# Patient Record
Sex: Male | Born: 1950 | Race: White | Hispanic: No | Marital: Married | Smoking: Former smoker
Health system: Southern US, Community
[De-identification: ages and names within clinical notes are randomized; demographics above are authoritative.]

## PROBLEM LIST (undated history)

## (undated) ENCOUNTER — Emergency Department (HOSPITAL_BASED_OUTPATIENT_CLINIC_OR_DEPARTMENT_OTHER): Admission: EM | Payer: Medicare Other | Source: Home / Self Care

## (undated) DIAGNOSIS — G4733 Obstructive sleep apnea (adult) (pediatric): Secondary | ICD-10-CM

## (undated) DIAGNOSIS — M1990 Unspecified osteoarthritis, unspecified site: Secondary | ICD-10-CM

## (undated) DIAGNOSIS — E785 Hyperlipidemia, unspecified: Secondary | ICD-10-CM

## (undated) DIAGNOSIS — F99 Mental disorder, not otherwise specified: Secondary | ICD-10-CM

## (undated) DIAGNOSIS — E669 Obesity, unspecified: Secondary | ICD-10-CM

## (undated) DIAGNOSIS — R001 Bradycardia, unspecified: Secondary | ICD-10-CM

## (undated) DIAGNOSIS — H409 Unspecified glaucoma: Secondary | ICD-10-CM

## (undated) DIAGNOSIS — Z87442 Personal history of urinary calculi: Secondary | ICD-10-CM

## (undated) DIAGNOSIS — F419 Anxiety disorder, unspecified: Secondary | ICD-10-CM

## (undated) DIAGNOSIS — K219 Gastro-esophageal reflux disease without esophagitis: Secondary | ICD-10-CM

## (undated) DIAGNOSIS — I10 Essential (primary) hypertension: Secondary | ICD-10-CM

## (undated) DIAGNOSIS — I2510 Atherosclerotic heart disease of native coronary artery without angina pectoris: Secondary | ICD-10-CM

## (undated) DIAGNOSIS — N400 Enlarged prostate without lower urinary tract symptoms: Secondary | ICD-10-CM

## (undated) DIAGNOSIS — F4310 Post-traumatic stress disorder, unspecified: Secondary | ICD-10-CM

## (undated) DIAGNOSIS — R52 Pain, unspecified: Secondary | ICD-10-CM

## (undated) DIAGNOSIS — K279 Peptic ulcer, site unspecified, unspecified as acute or chronic, without hemorrhage or perforation: Secondary | ICD-10-CM

## (undated) DIAGNOSIS — I358 Other nonrheumatic aortic valve disorders: Secondary | ICD-10-CM

## (undated) HISTORY — DX: Unspecified glaucoma: H40.9

## (undated) HISTORY — DX: Anxiety disorder, unspecified: F41.9

## (undated) HISTORY — DX: Other nonrheumatic aortic valve disorders: I35.8

## (undated) HISTORY — PX: CARDIAC CATHETERIZATION: SHX172

## (undated) HISTORY — PX: CORONARY ANGIOPLASTY: SHX604

## (undated) HISTORY — DX: Peptic ulcer, site unspecified, unspecified as acute or chronic, without hemorrhage or perforation: K27.9

## (undated) HISTORY — DX: Bradycardia, unspecified: R00.1

## (undated) HISTORY — DX: Obesity, unspecified: E66.9

## (undated) HISTORY — PX: OTHER SURGICAL HISTORY: SHX169

## (undated) HISTORY — DX: Obstructive sleep apnea (adult) (pediatric): G47.33

## (undated) HISTORY — DX: Post-traumatic stress disorder, unspecified: F43.10

---
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
## 2002-03-06 ENCOUNTER — Ambulatory Visit (HOSPITAL_COMMUNITY): Admission: RE | Admit: 2002-03-06 | Discharge: 2002-03-07 | Payer: Self-pay | Admitting: Cardiology

## 2002-04-04 ENCOUNTER — Encounter: Payer: Self-pay | Admitting: Family Medicine

## 2002-04-04 ENCOUNTER — Encounter: Admission: RE | Admit: 2002-04-04 | Discharge: 2002-04-04 | Payer: Self-pay | Admitting: Family Medicine

## 2002-11-07 ENCOUNTER — Ambulatory Visit (HOSPITAL_BASED_OUTPATIENT_CLINIC_OR_DEPARTMENT_OTHER): Admission: RE | Admit: 2002-11-07 | Discharge: 2002-11-07 | Payer: Self-pay | Admitting: Orthopedic Surgery

## 2003-03-09 ENCOUNTER — Ambulatory Visit (HOSPITAL_BASED_OUTPATIENT_CLINIC_OR_DEPARTMENT_OTHER): Admission: RE | Admit: 2003-03-09 | Discharge: 2003-03-09 | Payer: Self-pay | Admitting: Family Medicine

## 2003-05-07 ENCOUNTER — Encounter: Admission: RE | Admit: 2003-05-07 | Discharge: 2003-05-07 | Payer: Self-pay | Admitting: Family Medicine

## 2005-03-26 ENCOUNTER — Encounter: Admission: RE | Admit: 2005-03-26 | Discharge: 2005-03-26 | Payer: Self-pay | Admitting: Family Medicine

## 2007-02-09 ENCOUNTER — Emergency Department (HOSPITAL_COMMUNITY): Admission: EM | Admit: 2007-02-09 | Discharge: 2007-02-09 | Payer: Self-pay | Admitting: Emergency Medicine

## 2007-07-25 ENCOUNTER — Inpatient Hospital Stay (HOSPITAL_COMMUNITY): Admission: EM | Admit: 2007-07-25 | Discharge: 2007-07-28 | Payer: Self-pay | Admitting: Emergency Medicine

## 2007-10-24 ENCOUNTER — Other Ambulatory Visit: Payer: Self-pay | Admitting: Emergency Medicine

## 2007-10-24 ENCOUNTER — Inpatient Hospital Stay (HOSPITAL_COMMUNITY): Admission: EM | Admit: 2007-10-24 | Discharge: 2007-10-26 | Payer: Self-pay | Admitting: Emergency Medicine

## 2008-03-02 ENCOUNTER — Encounter: Admission: RE | Admit: 2008-03-02 | Discharge: 2008-03-02 | Payer: Self-pay | Admitting: Orthopedic Surgery

## 2008-08-27 ENCOUNTER — Emergency Department (HOSPITAL_COMMUNITY): Admission: EM | Admit: 2008-08-27 | Discharge: 2008-08-27 | Payer: Self-pay | Admitting: Emergency Medicine

## 2008-11-05 ENCOUNTER — Ambulatory Visit: Payer: Self-pay | Admitting: Radiology

## 2008-11-05 ENCOUNTER — Emergency Department (HOSPITAL_BASED_OUTPATIENT_CLINIC_OR_DEPARTMENT_OTHER): Admission: EM | Admit: 2008-11-05 | Discharge: 2008-11-05 | Payer: Self-pay | Admitting: Emergency Medicine

## 2009-09-03 HISTORY — PX: CERVICAL DISCECTOMY: SHX98

## 2009-09-30 ENCOUNTER — Ambulatory Visit: Payer: Self-pay | Admitting: Cardiology

## 2009-09-30 ENCOUNTER — Inpatient Hospital Stay (HOSPITAL_COMMUNITY)
Admission: RE | Admit: 2009-09-30 | Discharge: 2009-10-05 | Payer: Self-pay | Source: Home / Self Care | Admitting: Orthopedic Surgery

## 2010-02-12 ENCOUNTER — Emergency Department (HOSPITAL_BASED_OUTPATIENT_CLINIC_OR_DEPARTMENT_OTHER): Admission: EM | Admit: 2010-02-12 | Discharge: 2010-02-12 | Payer: Self-pay | Admitting: Emergency Medicine

## 2010-03-18 ENCOUNTER — Encounter: Admission: RE | Admit: 2010-03-18 | Discharge: 2010-03-18 | Payer: Self-pay | Admitting: Family Medicine

## 2010-03-20 IMAGING — CR DG FOOT COMPLETE 3+V*L*
3 series · 3 of 3 positions shown · non-contrast
Comparison: None

CLINICAL DATA: Foot and ankle pain

LEFT FOOT - COMPLETE 3+ VIEW

[t foot lat left]
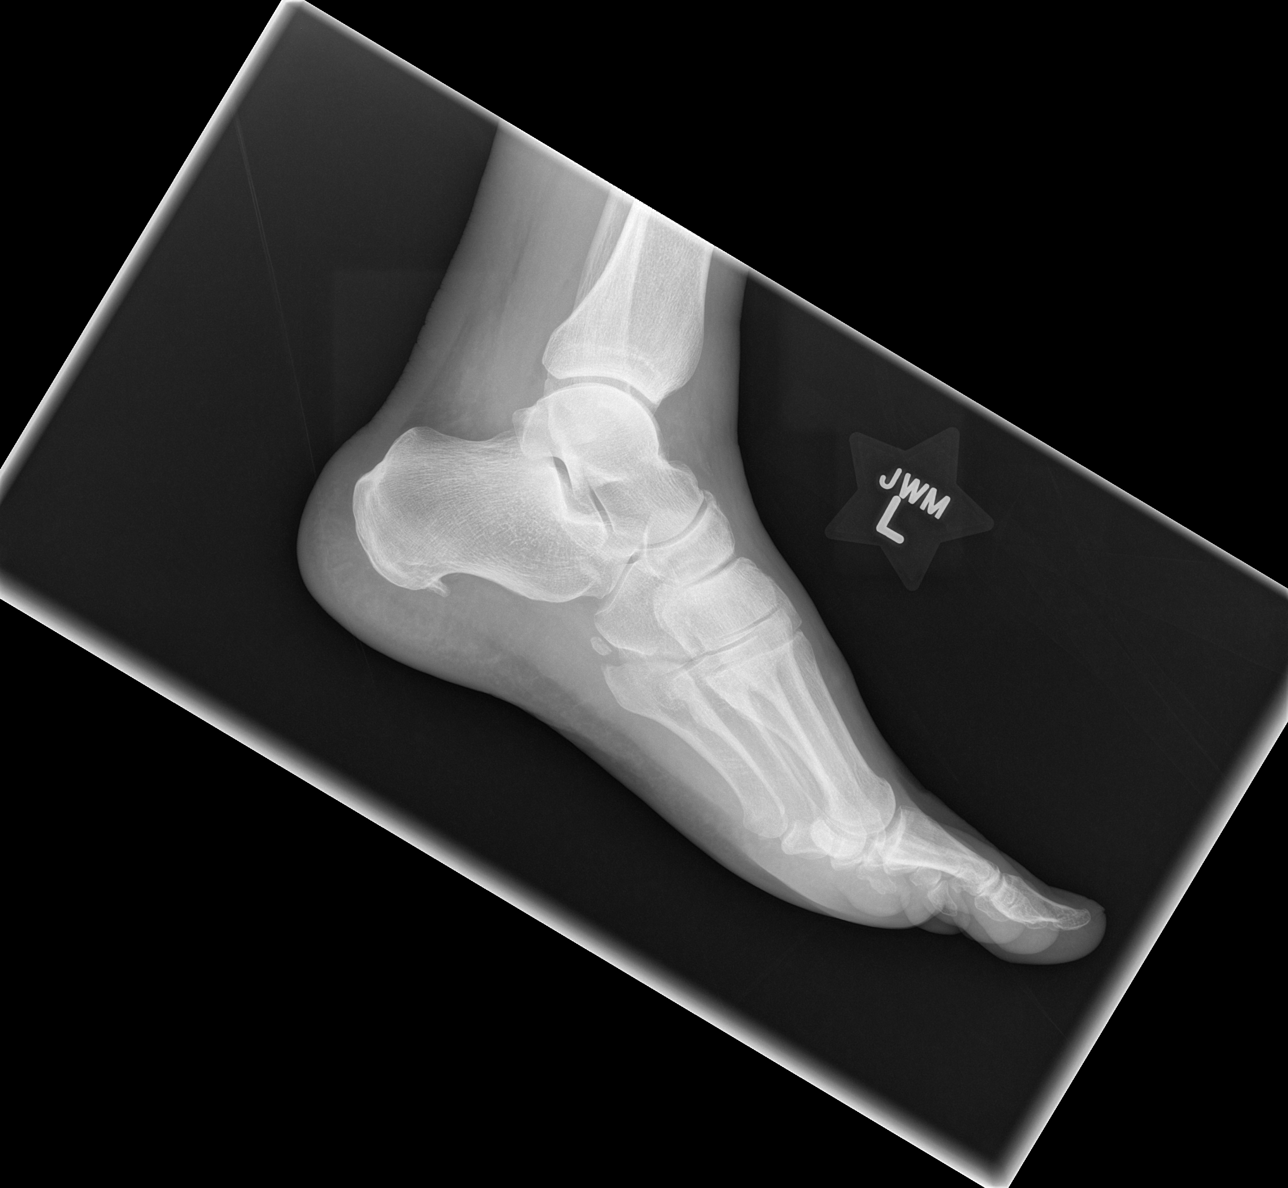

[t foot ap left]
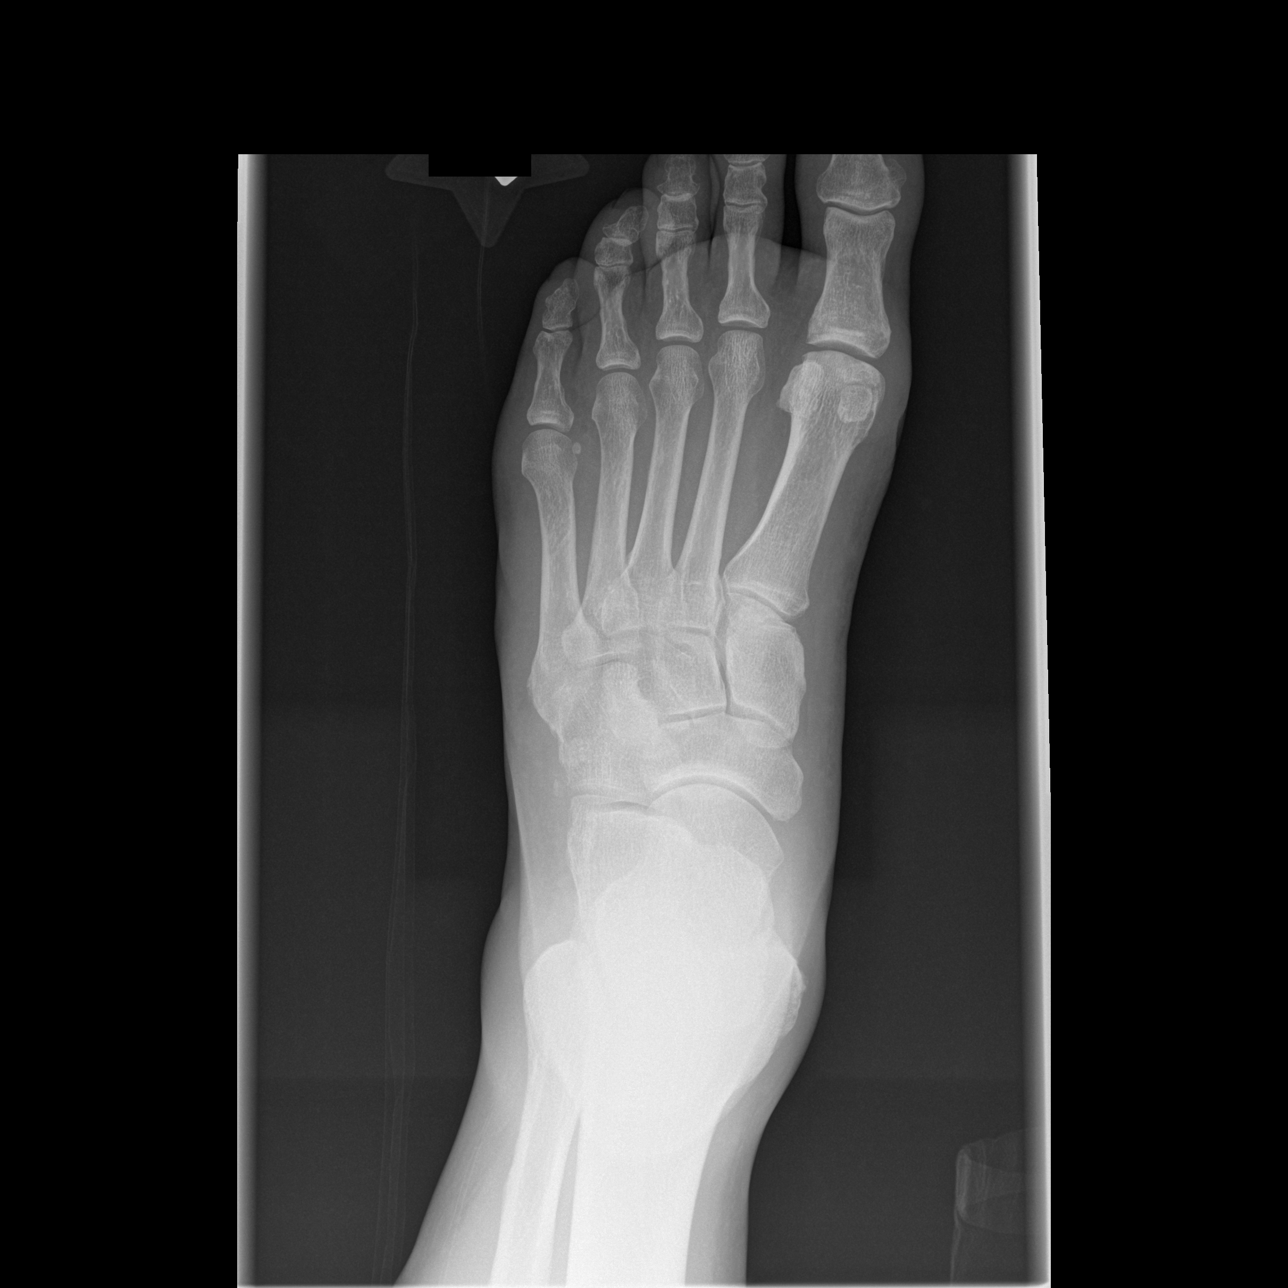

[t foot oblique left]
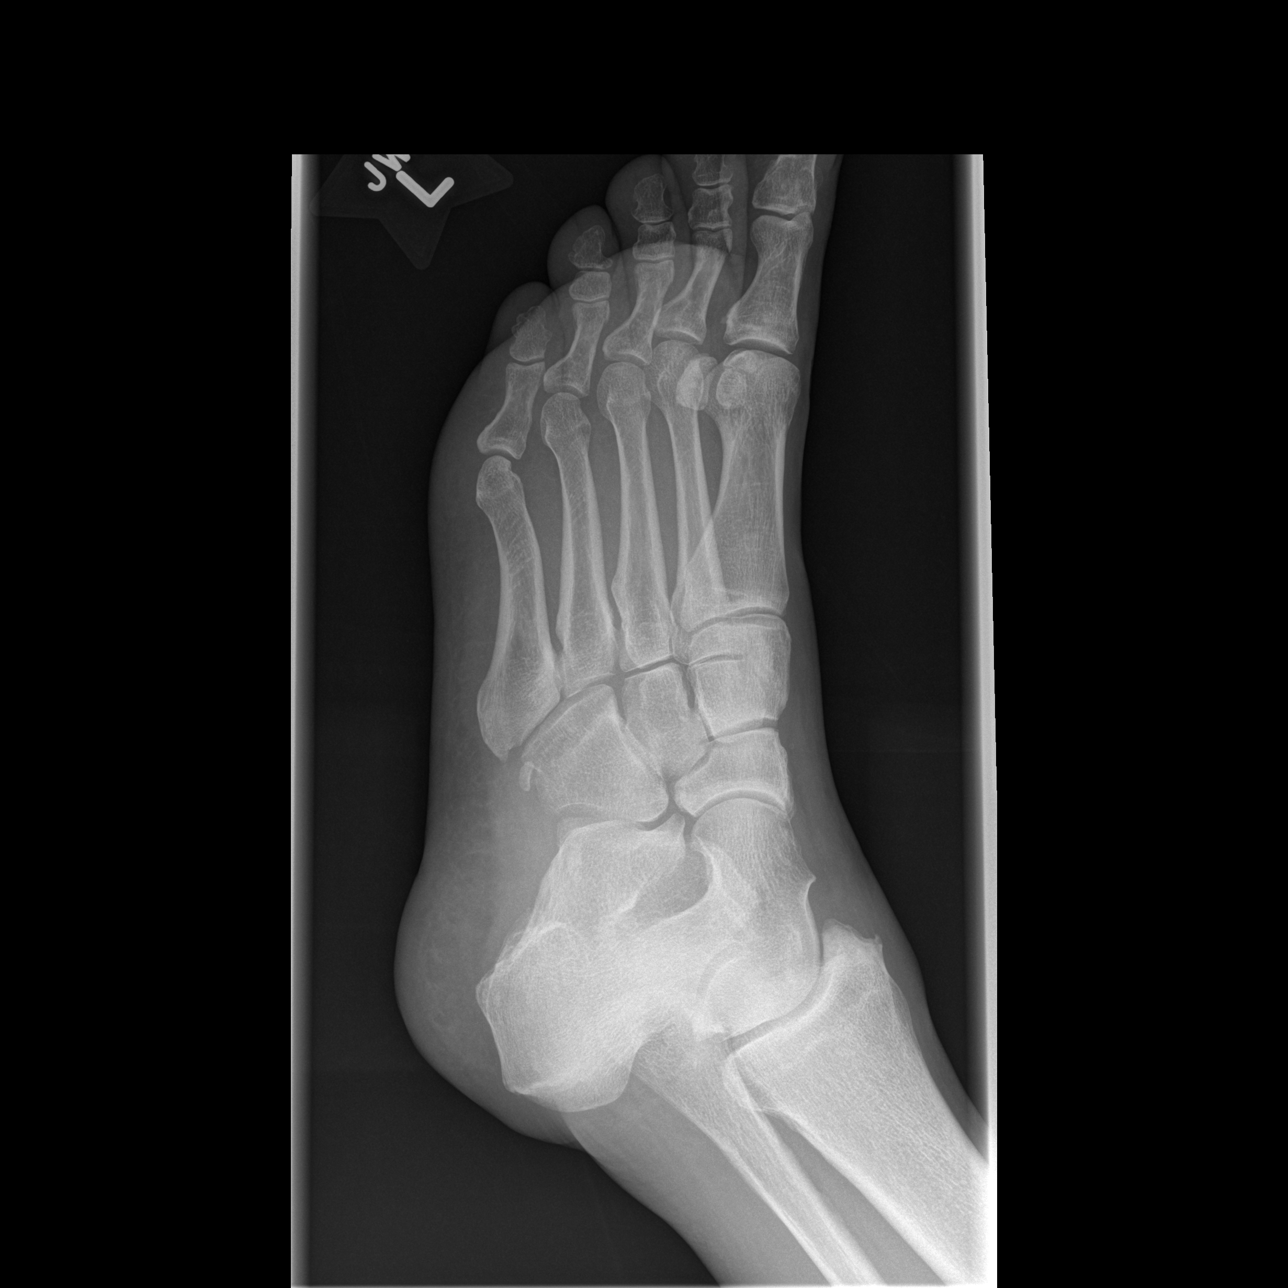

[3 of 3 positions shown; findings below may reference images not displayed]

FINDINGS: Anatomic alignment.  No fracture.  Mild degenerative
changes.
IMPRESSION: Negative for fracture.

## 2010-04-17 ENCOUNTER — Emergency Department (HOSPITAL_BASED_OUTPATIENT_CLINIC_OR_DEPARTMENT_OTHER): Admission: EM | Admit: 2010-04-17 | Discharge: 2010-04-17 | Payer: Self-pay | Admitting: Emergency Medicine

## 2010-04-17 ENCOUNTER — Ambulatory Visit: Payer: Self-pay | Admitting: Diagnostic Radiology

## 2010-06-23 ENCOUNTER — Encounter (HOSPITAL_COMMUNITY)
Admission: RE | Admit: 2010-06-23 | Discharge: 2010-07-05 | Payer: Self-pay | Source: Home / Self Care | Attending: Cardiology | Admitting: Cardiology

## 2010-06-28 LAB — GLUCOSE, CAPILLARY: Glucose-Capillary: 292 mg/dL — ABNORMAL HIGH (ref 70–99)

## 2010-07-06 ENCOUNTER — Encounter (HOSPITAL_COMMUNITY): Payer: BC Managed Care – PPO

## 2010-07-08 ENCOUNTER — Encounter (HOSPITAL_COMMUNITY): Payer: BC Managed Care – PPO

## 2010-07-11 ENCOUNTER — Other Ambulatory Visit: Payer: Self-pay

## 2010-07-11 ENCOUNTER — Encounter (HOSPITAL_COMMUNITY): Payer: BC Managed Care – PPO | Attending: Cardiology

## 2010-07-11 DIAGNOSIS — E119 Type 2 diabetes mellitus without complications: Secondary | ICD-10-CM | POA: Insufficient documentation

## 2010-07-11 DIAGNOSIS — I2510 Atherosclerotic heart disease of native coronary artery without angina pectoris: Secondary | ICD-10-CM | POA: Insufficient documentation

## 2010-07-11 DIAGNOSIS — Z7982 Long term (current) use of aspirin: Secondary | ICD-10-CM | POA: Insufficient documentation

## 2010-07-11 DIAGNOSIS — I252 Old myocardial infarction: Secondary | ICD-10-CM | POA: Insufficient documentation

## 2010-07-11 DIAGNOSIS — G4733 Obstructive sleep apnea (adult) (pediatric): Secondary | ICD-10-CM | POA: Insufficient documentation

## 2010-07-11 DIAGNOSIS — Z7902 Long term (current) use of antithrombotics/antiplatelets: Secondary | ICD-10-CM | POA: Insufficient documentation

## 2010-07-11 DIAGNOSIS — Z5189 Encounter for other specified aftercare: Secondary | ICD-10-CM | POA: Insufficient documentation

## 2010-07-11 DIAGNOSIS — Z9861 Coronary angioplasty status: Secondary | ICD-10-CM | POA: Insufficient documentation

## 2010-07-11 DIAGNOSIS — I10 Essential (primary) hypertension: Secondary | ICD-10-CM | POA: Insufficient documentation

## 2010-07-12 LAB — GLUCOSE, CAPILLARY: Glucose-Capillary: 229 mg/dL — ABNORMAL HIGH (ref 70–99)

## 2010-07-13 ENCOUNTER — Encounter (HOSPITAL_COMMUNITY): Payer: BC Managed Care – PPO

## 2010-07-13 ENCOUNTER — Other Ambulatory Visit: Payer: Self-pay

## 2010-07-13 LAB — GLUCOSE, CAPILLARY: Glucose-Capillary: 206 mg/dL — ABNORMAL HIGH (ref 70–99)

## 2010-07-15 ENCOUNTER — Encounter (HOSPITAL_COMMUNITY): Payer: BC Managed Care – PPO

## 2010-07-18 ENCOUNTER — Encounter (HOSPITAL_COMMUNITY): Payer: BC Managed Care – PPO

## 2010-07-20 ENCOUNTER — Encounter (HOSPITAL_COMMUNITY): Payer: BC Managed Care – PPO

## 2010-07-22 ENCOUNTER — Encounter (HOSPITAL_COMMUNITY): Payer: BC Managed Care – PPO

## 2010-07-25 ENCOUNTER — Encounter (HOSPITAL_COMMUNITY): Payer: BC Managed Care – PPO

## 2010-07-27 ENCOUNTER — Encounter (HOSPITAL_COMMUNITY): Payer: BC Managed Care – PPO

## 2010-07-29 ENCOUNTER — Encounter (HOSPITAL_COMMUNITY): Payer: BC Managed Care – PPO

## 2010-08-01 ENCOUNTER — Encounter (HOSPITAL_COMMUNITY): Payer: BC Managed Care – PPO

## 2010-08-03 ENCOUNTER — Encounter (HOSPITAL_COMMUNITY): Payer: BC Managed Care – PPO

## 2010-08-05 ENCOUNTER — Encounter (HOSPITAL_COMMUNITY)

## 2010-08-08 ENCOUNTER — Encounter (HOSPITAL_COMMUNITY)

## 2010-08-10 ENCOUNTER — Encounter (HOSPITAL_COMMUNITY)

## 2010-08-12 ENCOUNTER — Encounter (HOSPITAL_COMMUNITY)

## 2010-08-15 ENCOUNTER — Encounter (HOSPITAL_COMMUNITY)

## 2010-08-16 LAB — BASIC METABOLIC PANEL
CO2: 25 mEq/L (ref 19–32)
Chloride: 101 mEq/L (ref 96–112)
Creatinine, Ser: 0.9 mg/dL (ref 0.4–1.5)
Glucose, Bld: 184 mg/dL — ABNORMAL HIGH (ref 70–99)
Potassium: 4.8 mEq/L (ref 3.5–5.1)
Sodium: 138 mEq/L (ref 135–145)

## 2010-08-16 LAB — CBC
Hemoglobin: 15.1 g/dL (ref 13.0–17.0)
MCH: 31.1 pg (ref 26.0–34.0)
MCV: 91.2 fL (ref 78.0–100.0)
Platelets: 185 10*3/uL (ref 150–400)
RBC: 4.84 MIL/uL (ref 4.22–5.81)
RDW: 12.1 % (ref 11.5–15.5)

## 2010-08-16 LAB — DIFFERENTIAL
Basophils Absolute: 0.1 10*3/uL (ref 0.0–0.1)
Basophils Relative: 1 % (ref 0–1)
Lymphocytes Relative: 21 % (ref 12–46)
Neutro Abs: 5.8 10*3/uL (ref 1.7–7.7)

## 2010-08-17 ENCOUNTER — Encounter (HOSPITAL_COMMUNITY)

## 2010-08-19 ENCOUNTER — Encounter (HOSPITAL_COMMUNITY)

## 2010-08-22 ENCOUNTER — Encounter (HOSPITAL_COMMUNITY)

## 2010-08-22 ENCOUNTER — Emergency Department (INDEPENDENT_AMBULATORY_CARE_PROVIDER_SITE_OTHER): Payer: BC Managed Care – PPO

## 2010-08-22 ENCOUNTER — Emergency Department (HOSPITAL_BASED_OUTPATIENT_CLINIC_OR_DEPARTMENT_OTHER)
Admission: EM | Admit: 2010-08-22 | Discharge: 2010-08-22 | Disposition: A | Discharge status: Home / Self Care | Payer: BC Managed Care – PPO | Attending: Emergency Medicine | Admitting: Emergency Medicine

## 2010-08-22 DIAGNOSIS — E119 Type 2 diabetes mellitus without complications: Secondary | ICD-10-CM | POA: Insufficient documentation

## 2010-08-22 DIAGNOSIS — J029 Acute pharyngitis, unspecified: Secondary | ICD-10-CM

## 2010-08-22 DIAGNOSIS — I252 Old myocardial infarction: Secondary | ICD-10-CM | POA: Insufficient documentation

## 2010-08-22 DIAGNOSIS — Z79899 Other long term (current) drug therapy: Secondary | ICD-10-CM | POA: Insufficient documentation

## 2010-08-22 DIAGNOSIS — I2510 Atherosclerotic heart disease of native coronary artery without angina pectoris: Secondary | ICD-10-CM | POA: Insufficient documentation

## 2010-08-22 DIAGNOSIS — I10 Essential (primary) hypertension: Secondary | ICD-10-CM | POA: Insufficient documentation

## 2010-08-22 DIAGNOSIS — E7800 Pure hypercholesterolemia, unspecified: Secondary | ICD-10-CM | POA: Insufficient documentation

## 2010-08-22 LAB — RAPID STREP SCREEN (MED CTR MEBANE ONLY): Streptococcus, Group A Screen (Direct): NEGATIVE

## 2010-08-23 LAB — GLUCOSE, CAPILLARY
Glucose-Capillary: 196 mg/dL — ABNORMAL HIGH (ref 70–99)
Glucose-Capillary: 302 mg/dL — ABNORMAL HIGH (ref 70–99)
Glucose-Capillary: 308 mg/dL — ABNORMAL HIGH (ref 70–99)
Glucose-Capillary: 141 mg/dL — ABNORMAL HIGH (ref 70–99)
Glucose-Capillary: 174 mg/dL — ABNORMAL HIGH (ref 70–99)
Glucose-Capillary: 246 mg/dL — ABNORMAL HIGH (ref 70–99)
Glucose-Capillary: 280 mg/dL — ABNORMAL HIGH (ref 70–99)
Glucose-Capillary: 302 mg/dL — ABNORMAL HIGH (ref 70–99)
Glucose-Capillary: 362 mg/dL — ABNORMAL HIGH (ref 70–99)

## 2010-08-23 LAB — CBC
HCT: 41 % (ref 39.0–52.0)
Hemoglobin: 13.5 g/dL (ref 13.0–17.0)
Hemoglobin: 14.6 g/dL (ref 13.0–17.0)
MCHC: 34.3 g/dL (ref 30.0–36.0)
MCHC: 34.5 g/dL (ref 30.0–36.0)
MCHC: 35.2 g/dL (ref 30.0–36.0)
MCV: 93 fL (ref 78.0–100.0)
Platelets: 208 10*3/uL (ref 150–400)
Platelets: 221 10*3/uL (ref 150–400)
RBC: 4.57 MIL/uL (ref 4.22–5.81)
RDW: 12.4 % (ref 11.5–15.5)
RDW: 12.4 % (ref 11.5–15.5)
WBC: 7.6 10*3/uL (ref 4.0–10.5)

## 2010-08-23 LAB — PROTIME-INR
INR: 0.97 (ref 0.00–1.49)
INR: 1.07 (ref 0.00–1.49)
Prothrombin Time: 13.8 seconds (ref 11.6–15.2)

## 2010-08-23 LAB — BASIC METABOLIC PANEL
BUN: 8 mg/dL (ref 6–23)
CO2: 25 mEq/L (ref 19–32)
CO2: 25 mEq/L (ref 19–32)
Calcium: 8.3 mg/dL — ABNORMAL LOW (ref 8.4–10.5)
Calcium: 8.7 mg/dL (ref 8.4–10.5)
Chloride: 104 mEq/L (ref 96–112)
Creatinine, Ser: 0.82 mg/dL (ref 0.4–1.5)
GFR calc Af Amer: >60 mL/min (ref >60)
GFR calc non Af Amer: >60 mL/min (ref >60)
Glucose, Bld: 244 mg/dL — ABNORMAL HIGH (ref 70–99)
Sodium: 131 mEq/L — ABNORMAL LOW (ref 135–145)
Sodium: 134 mEq/L — ABNORMAL LOW (ref 135–145)

## 2010-08-23 LAB — CARDIAC PANEL(CRET KIN+CKTOT+MB+TROPI)
CK, MB: 147.9 ng/mL (ref 0.3–4.0)
Relative Index: 14.8 — ABNORMAL HIGH (ref 0.0–2.5)
Relative Index: 3.9 — ABNORMAL HIGH (ref 0.0–2.5)
Total CK: 179 U/L (ref 7–232)
Total CK: 807 U/L — ABNORMAL HIGH (ref 7–232)
Troponin I: 0.17 ng/mL — ABNORMAL HIGH (ref 0.00–0.06)
Troponin I: 12.11 ng/mL (ref 0.00–0.06)
Troponin I: 3.45 ng/mL (ref 0.00–0.06)

## 2010-08-23 LAB — APTT: aPTT: 25 seconds (ref 24–37)

## 2010-08-23 LAB — HEMOGLOBIN A1C
Hgb A1c MFr Bld: 7.3 % — ABNORMAL HIGH (ref <5.7)
Mean Plasma Glucose: 163 mg/dL — ABNORMAL HIGH (ref <117)

## 2010-08-23 LAB — COMPREHENSIVE METABOLIC PANEL
Albumin: 3.9 g/dL (ref 3.5–5.2)
Alkaline Phosphatase: 52 U/L (ref 39–117)
BUN: 7 mg/dL (ref 6–23)
Calcium: 8.5 mg/dL (ref 8.4–10.5)
Creatinine, Ser: 0.82 mg/dL (ref 0.4–1.5)
Potassium: 4.1 mEq/L (ref 3.5–5.1)
Total Protein: 7 g/dL (ref 6.0–8.3)

## 2010-08-23 LAB — TSH: TSH: 0.715 u[IU]/mL (ref 0.350–4.500)

## 2010-08-23 LAB — MRSA PCR SCREENING: MRSA by PCR: NEGATIVE

## 2010-08-24 ENCOUNTER — Encounter (HOSPITAL_COMMUNITY)

## 2010-08-26 ENCOUNTER — Encounter (HOSPITAL_COMMUNITY)

## 2010-08-29 ENCOUNTER — Encounter (HOSPITAL_COMMUNITY)

## 2010-08-31 ENCOUNTER — Encounter (HOSPITAL_COMMUNITY)

## 2010-09-02 ENCOUNTER — Encounter (HOSPITAL_COMMUNITY)

## 2010-09-05 ENCOUNTER — Encounter (HOSPITAL_COMMUNITY)

## 2010-09-07 ENCOUNTER — Encounter (HOSPITAL_COMMUNITY)

## 2010-09-09 ENCOUNTER — Encounter (HOSPITAL_COMMUNITY)

## 2010-09-12 ENCOUNTER — Encounter (HOSPITAL_COMMUNITY)

## 2010-09-14 ENCOUNTER — Encounter (HOSPITAL_COMMUNITY)

## 2010-09-15 LAB — POCT I-STAT, CHEM 8
Calcium, Ion: 1 mmol/L — ABNORMAL LOW (ref 1.12–1.32)
Chloride: 104 mEq/L (ref 96–112)
Creatinine, Ser: 0.9 mg/dL (ref 0.4–1.5)
Glucose, Bld: 163 mg/dL — ABNORMAL HIGH (ref 70–99)
Potassium: 3.8 mEq/L (ref 3.5–5.1)

## 2010-09-15 LAB — URINALYSIS, ROUTINE W REFLEX MICROSCOPIC
Bilirubin Urine: NEGATIVE
Hgb urine dipstick: NEGATIVE
Nitrite: NEGATIVE
Specific Gravity, Urine: 1.022 (ref 1.005–1.030)
Urobilinogen, UA: 1 mg/dL (ref 0.0–1.0)
pH: 5.5 (ref 5.0–8.0)

## 2010-09-15 LAB — CBC
Hemoglobin: 15.9 g/dL (ref 13.0–17.0)
MCV: 91.6 fL (ref 78.0–100.0)
RBC: 4.98 MIL/uL (ref 4.22–5.81)
WBC: 7.7 10*3/uL (ref 4.0–10.5)

## 2010-09-15 LAB — DIFFERENTIAL
Lymphs Abs: 2.6 10*3/uL (ref 0.7–4.0)
Monocytes Absolute: 0.6 10*3/uL (ref 0.1–1.0)
Monocytes Relative: 8 % (ref 3–12)
Neutro Abs: 3.9 10*3/uL (ref 1.7–7.7)
Neutrophils Relative %: 51 % (ref 43–77)

## 2010-09-16 ENCOUNTER — Encounter (HOSPITAL_COMMUNITY)

## 2010-09-19 ENCOUNTER — Encounter (HOSPITAL_COMMUNITY)

## 2010-09-21 ENCOUNTER — Encounter (HOSPITAL_COMMUNITY)

## 2010-09-23 ENCOUNTER — Encounter (HOSPITAL_COMMUNITY)

## 2010-09-26 ENCOUNTER — Encounter (HOSPITAL_COMMUNITY)

## 2010-10-18 NOTE — Cardiovascular Report (Signed)
Seth Washington, Seth Washington               ACCOUNT NO.:  [id number]   MEDICAL RECORD NO.:  [id number]          PATIENT TYPE:  INP   LOCATION:  2920                         FACILITY:  [facility]   PHYSICIAN:  Armanda Magic, M.D.     DATE OF BIRTH:  1950-11-29   DATE OF PROCEDURE:  DATE OF DISCHARGE:                            CARDIAC CATHETERIZATION   PROCEDURES:  Left heart catheterization, coronary angiography, left  ventriculography, aortic root angiography.   OPERATOR:  Armanda Magic, MD.   INDICATIONS:  Acute non-ST elevation MI with an occluded posterolateral  branch of the RCA.   COMPLICATIONS:  None.   IV MEDICATIONS:  1. Versed 3 mg IV.  2. Lopressor 7.5 mg IV.  3. Fentanyl 50 mcg IV.  4. IV nitroglycerin at 45 mcg per minute.   IV ACCESS:  Via right femoral artery 6-French sheath.   HISTORY:  This is a 59 year old male with a history of coronary disease  status post PTCA stenting of the LAD several years ago, who presented  with an episode of chest pain that awakened him at night.  By the time  he got to the emergency room, he was pain free.  Initial point of care  enzymes were normal.  Around 1 p.m. today, he developed acute substernal  chest pain recurrent similar to what he had last night, radiating down  his arms up into his neck.  It was 10/10.  He was markedly hypertensive  with a pressure 168 over approximately 170 mmHg.  The patient was taken  emergently to cardiac catheterization laboratory after informed consent  was obtained.  The patient was connected to continuous heart rate, pulse  oximetry monitoring and intermittent blood pressure monitoring.  The  right groin was prepped and draped in a sterile fashion.  Xylocaine 1%  was used for local anesthesia.  Using modified Seldinger technique, a 6-  French sheath was placed in the right femoral artery.  Under  fluoroscopic guidance, a 6-French JL-4 catheter was placed in the left  coronary artery.  It could not engage the  coronary ostium adequately and  was exchanged out over a guidewire for 6-French JL-5 catheter which was  placed under fluoroscopic guidance in the left coronary artery.  Multiple cine films were taken at 30 degree RAO and 40 degree LAO views.  His catheter was then exchanged out over a guidewire for a 6-French JR-4  catheter which was placed under fluoroscopic guidance in the right  coronary artery.  Multiple cine films were taken at 30 degree RAO and 40  degree LAO views.  His catheter was then exchanged out over a guidewire  for a 6-French angled pigtail catheter which was placed under  fluoroscopic guidance in the left ventricular cavity.  Left  ventriculography was performed in the 30 degree RAO view using a total  of 30 cc of contrast at 15 cc per second.  The catheter was then pulled  back across the aortic valve with no significant gradient noted.  Aortic  root shot was then performed using a total of 25 cc of contrast at 12 cc  per second.  Catheter was then removed over a guidewire.  The patient  subsequently went on to PCI of the posterolateral branch of the RCA.   RESULTS:  The left main coronary artery is widely patent and bifurcates  to the left anterior descending artery and left circumflex artery.  The  left anterior descending artery has a 50% proximal narrowing just before  the stent.  The stent is widely patent.  The LAD gives off several  diagonal branches.  The first diagonal branch has a fairly tight  stenosis of at least 90% and is a moderate-sized vessel.  The ongoing  LAD in the midportion is widely patent, but then distally has a 60%  narrowing and then 2 sequential 80% narrowings at the very apical  portion of the LAD.   The left circumflex in its proximal portion is patent and gives off to 2  very small obtuse marginal branches.  There is then a 60-70% narrowing  in the proximal to mid left circumflex.  It gives rise to a second  obtuse marginal branch which  is widely patent.  The ongoing left  circumflex traverses the AV groove and gives rise to a third obtuse  marginal branch which is widely patent and then terminates in a fourth  obtuse marginal branch with a 60-70% mid stenosis.  The right coronary  artery has luminal irregularities throughout its proximal midportion.  There appears to be sluggish flow in the distal portion of the RCA.  At  the bifurcation of the RCA distally, it bifurcates into posterior  descending artery which is patent and a posterolateral artery which is  occluded proximally.   Left ventriculography shows normal LV function, EF 55% with inferior  basal hypokinesis. LV pressure 158/90 mmHg, aortic pressure 174/96 mmHg.   ASSESSMENT:  1. Severe two-vessel coronary disease with occluded posterolateral      branch and high-grade distal left anterior descending.  2. Normal left ventricular function.  3. Malignant hypertension, status post treatment with IV Lopressor and      IV nitroglycerin drip.   PLAN:  1. PCI of the posterolateral branch of the RCA.  2. Aspirin, Plavix and IV nitroglycerin drip for blood pressure      control.  3. Altace 5 mg a day.  4. Lopressor 25 mg b.i.d.  5. Check a fasting lipid panel in the morning.      Armanda Magic, M.D.  Electronically Signed     TT/MEDQ  D:  07/25/2007  T:  07/26/2007  Job:  [id number]   cc:   Bryan Lemma. Manus Gunning, M.D.

## 2010-10-18 NOTE — H&P (Signed)
[name], [name]               ACCOUNT NO.:  [id number]   MEDICAL RECORD NO.:  [id number]          PATIENT TYPE:  INP   LOCATION:  2807                         FACILITY:  [facility]   PHYSICIAN:  Armanda Magic, M.D.     DATE OF BIRTH:  01-Jul-1950   DATE OF ADMISSION:  10/24/2007  DATE OF DISCHARGE:                              HISTORY & PHYSICAL   CHIEF COMPLAINT:  Chest burning, neck tightness.   HISTORY OF PRESENT ILLNESS:  Seth Washington is a pleasant 60 year old  gentleman with known CAD, status post acute inferior non-ST elevated MI  in February this year.  He had two-vessel disease on cath at that time  and underwent PCI on the distal total occluded RCA.  His LAD had distal  occlusion at 80% in the very apical portion.  He has done well since the  procedure.  Over the last 2 days, however, he began experiencing the  same chest burning and shortness of breath that he had prior to his MI.  It would radiate into the neck and feel like a tightness.  Symptoms are  associated with exertion.  On one particular episode after a walk, he  had some sweats with it.  He has not tried any nitroglycerin for this  discomfort, but does state that the discomfort was not relieved by  nitroglycerin before when he had his MI.  Due to his history, he is  being admitted to rule out MI and have possible catheterization this  afternoon.   REVIEW OF SYSTEMS:  GENERAL:  Just has not felt right.  No fever,  chills, recent weight loss or gain.  HEENT: No problems with vision,  diplopia, hearing loss, epistaxis, nasal congestion, sore throat.  CHEST:  As above.  The pain is not pleuritic nor has he experienced any  palpitations or tachyarrhythmias.  LUNGS:  As above.  No cough,  wheezing, PND or orthopnea.  ABDOMEN: No nausea, vomiting, abdominal  pain, problems with stools.  GU: No dysuria, urinary frequency,  hematuria.  EXTREMITIES:  Denies any edema.  NEUROLOGICAL: No  lightheadedness, dizziness,  presyncope, syncope, memory loss, seizures.   PAST MEDICAL HISTORY:  1. CAD, two-vessel status post PTCA stenting of the LAD, February      2008.  Non-ST elevated MI status post PTCA stenting of the PDA,      currently on aspirin and Plavix.  2. Distal RCA disease.  3. Normal LV function.  4. Hyperlipidemia.  5. Previous tobacco abuse.  6. Hypertension.  7. Anxiety.   PAST SURGICAL HISTORY:  Knee surgery in 1974.   MEDICATIONS:  1. Amlodipine 10 mg daily.  2. Omeprazole 20 mg daily.  3. Aspirin 325 mg day.  4. Plavix 75 mg daily.  5. Lopressor 25 mg one p.o. b.i.d.  6. Enalapril 5 mg daily.  7. Tamsulosin 0.4 mg daily.  8. Wellbutrin ER 150 mg twice a day.  9. Crestor 20 mg a day  10.Ambien 10 mg at bedtime p.r.n.  11.Claritin p.r.n.   DRUG ALLERGIES:  NONE.   SOCIAL HISTORY:  He is married with two  children in good health.  Previous smoker, one to one and a half packs for many years, quit in  February of this year.  No significant alcohol intake.  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   FAMILY HISTORY:  Father is mid 67s, hypertension, heart disease, also  had diabetes.  Mother in her early 7s with thyroid problems.  Brother  had CABG when he was 74.   OBJECTIVE:  VITAL SIGNS:  Weight is 223, pulse 68, blood pressure  110/74, respirations 18.  GENERAL: No acute distress.  SKIN:  Warm and dry.  HEENT: Ear, nose, throat unremarkable.  NECK:  No JVD.  No carotid bruits auscultated.  No adenopathy.  Thyroid  gland is not enlarged.  HEART:  Normal S1-S2.  No murmur, S3-S4 rub, click or gallop.  LUNGS:  Good excursion, clear throughout without rales, wheezing or  rhonchi.  ABDOMEN:  Nontender.  Bowel sounds present in all four quadrants.  No  abdominal bruits.  No tenderness.  EXTREMITIES:  Distal pulses +2 in the upper lower extremities.  No  clubbing, cyanosis or edema.   STUDIES:  EKG reveals normal sinus rhythm.  He has Q-waves and T-wave  inversion in  leads III and aVF, unchanged from prior.   IMPRESSION:  1. Chest discomfort, worrisome for angina.  2. Known CAD, status post intervention above.  3. Hypertension.  4. Anxiety.  5. Hyperlipidemia.   PLAN:  Will admit to telemetry.  Start nitroglycerin IV and heparin drip  per pharmacy protocol.  N.p.o. for possible cath this afternoon.      Tamera C. Baruch Merl, M.D.  Electronically Signed    TCL/MEDQ  D:  10/24/2007  T:  10/24/2007  Job:  [id number]

## 2010-10-18 NOTE — H&P (Signed)
Seth Washington, Seth Washington               ACCOUNT NO.:  [id number]   MEDICAL RECORD NO.:  [id number]          PATIENT TYPE:  INP   LOCATION:  2807                         FACILITY:  [facility]   PHYSICIAN:  Cassell Clement, M.D. DATE OF BIRTH:  [birth year]   DATE OF ADMISSION:  07/25/2007  DATE OF DISCHARGE:                              HISTORY & PHYSICAL   CHIEF COMPLAINT:  Chest pain and neck pain.   HISTORY:  This is a 60 year old gentleman who came to the emergency room  during the night with chest pain and neck pain.  He does have a past  history of coronary artery disease and had a stent by Dr. Mayford Knife and Dr.  Amil Amen about 4 years ago.  We do not have those records at the present  time.  He has had no recent stress test, etc.  He awoke last night out  of sleep with chest and neck discomfort.  He was short of breath.  There  was no nausea, vomiting or diaphoresis.  There was no radiation to the  arms.  The patient has had a recent head cold and had taken 2 Advil PM  at bedtime.  He had also eaten a sandwich at bedtime.   The patient has not been experiencing any recent exertional chest pain  to suggest angina pectoris.  He does not get any regular exercise.   His present medications are aspirin 325 mg daily, loratadine 10 mg as  needed, metoprolol 50 mg daily, omeprazole 20 mg daily and simvastatin  80 mg daily, and he takes them all in the evening but forgot to take  them last night.   Past medical history includes coronary artery disease, high cholesterol  and hypertension.   The family history reveals that his father is deceased but had had a  stent.  The patient's brother has had bypass surgery.  The patient's  mother is living and well.   SOCIAL HISTORY:  Reveals that he is married.  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  He has 2 children who live in [place].  The patient smokes cigars.  He  does not smoke cigarettes.  He drinks  very little alcohol.   He has no known drug allergies.   Review of systems reveals a history of gastroesophageal reflux.  Genitourinary history reveals that the [facility] had previously had  him on Flomax for BPH.  He has had a lot of orthopedic problems and  previous orthopedic surgery and presently needs surgery on his right  shoulder.  Dr. Luiz Blare is his orthopedist.  The remainder of review of  systems is negative in detail.   PHYSICAL EXAMINATION:  VITAL SIGNS:  His blood pressure 146/97, pulse  67, respirations normal.  HEENT: Negative.  CHEST:  Clear.  HEART:  Reveals no murmur, gallop, rub or click.  ABDOMEN:  Soft and nontender.  EXTREMITIES:  Show no phlebitis or edema.  Pedal pulses are good.   His electrocardiogram shows normal sinus rhythm and  is within normal  limits.  Initial cardiac enzymes point-of-care shows CK-MB less than 1,  troponin less than 0.05.  White count is 13,000, hemoglobin is 16.6.  Sodium 137, potassium 3.4, chloride 102, BUN 10, blood sugar 157.  He  does not have a history of diabetes.  Chest x-ray shows no acute  cardiopulmonary disease and mildly prominent transverse aorta.   IMPRESSION:  1. Chest pain, possibly cardiac in origin.  2. History of known coronary atherosclerotic heart disease, status      post stent approximately 4 years ago.  3. Hyperlipidemia.  4. Essential hypertension.   DISPOSITION:  Admit to Dr. Armanda Magic, observation status.  Further  workup by Dr. Mayford Knife would possibly include Cardiolite stress test  versus diagnostic cardiac cath.  We will keep him n.p.o.  We will keep  him off of  caffeine.  We will hold his beta blocker today to allow for  stress testing.  We will treat with aspirin and p.r.n. nitroglycerin.           ______________________________  Cassell Clement, M.D.     TB/MEDQ  D:  07/25/2007  T:  07/25/2007  Job:  [id number]   cc:   Armanda Magic, M.D.  Bryan Lemma. Manus Gunning, M.D.

## 2010-10-18 NOTE — Cardiovascular Report (Signed)
NAMEJOANNE, Seth Washington               ACCOUNT NO.:  [id number]   MEDICAL RECORD NO.:  [id number]          PATIENT TYPE:  INP   LOCATION:  2920                         FACILITY:  [facility]   PHYSICIAN:  Lyn Records, M.D.   DATE OF BIRTH:  [birth year]   DATE OF PROCEDURE:  07/25/2007  DATE OF DISCHARGE:                            CARDIAC CATHETERIZATION   INDICATIONS:  Acute inferior myocardial infarction.   PROCEDURES PERFORMED:  1. Stent with drug-eluting PROMUS to the distal right coronary artery.  2. Angio-Seal arteriotomy closure.   DESCRIPTION:  After sheath placement and catheterization performed by  Dr. Armanda Magic, the distal right coronary was identified as the  patient's culprit vessel causing acute infarction.  We decided to  proceed with mechanical revascularization using angioplasty and  stenting.   A 6-French XP right coronary system guide catheter was used.  We used an  Office manager that easily crossed the distal stenosis in the right  coronary.  This brought about reperfusion with transient reduction  ofblood flow and nausea.  We used a Fetch and did 2 aspiration  thrombectomy runs that led to return of TIMI grade 2 to 3 flow.  We then  predilated with a 3.0 x 12 mm long Maverick balloon and deployed a 3.5 x  18 PROMUS stent to 11 atmospheres.  Post dilatation was performed with a  375 x 15 Quantum to 14 atmospheres.   To protect the side branch of this culprit lesion, a BMW wire was placed  into the continuation of the right coronary beyond this first left  ventricular branch.  Prior to high pressure dilatation of the stent,  this BMW wire was removed.   The patient received  5 doses of intracoronary adenosine to improve flow  after the initial pre-dilatation and after the high-pressure stent  dilatation.  We also used 200 mcg of intracoronary nitroglycerin.  At  the completion of the case, 100% stenosis was reduced to 0% with  improvement in TIMI flow from  0 to 3.   Angio-Seal was performed with good hemostasis.  A gram of IV Ancef was  administered.  The patient did receive reprep of his groin prior to  Angio-Seal deployment.   CONCLUSION:  1. Successful percutaneous coronary intervention on distally totally      occluded right coronary from a 100% with TIMI grade 0 flow to 0%      with TIMI 3 flow.  2. Successful Angio-Seal.   PLAN:  1. Continue Angiomax infusion for an additional hour or two.  2. Continue Plavix 75 mg per day.  The patient was loaded with 600 mg      of Plavix approximately 35 minutes before PCI was performed.  The      patient did receive 4 baby aspirin prior to PCI as well.      Lyn Records, M.D.  Electronically Signed     HWS/MEDQ  D:  07/25/2007  T:  07/26/2007  Job:  [id number]   cc:   Armanda Magic, M.D.  Bryan Lemma. Manus Gunning, M.D.

## 2010-10-21 NOTE — Discharge Summary (Signed)
Seth Washington, Seth Washington               ACCOUNT NO.:  [id number]   MEDICAL RECORD NO.:  [id number]          PATIENT TYPE:  INP   LOCATION:  2926                         FACILITY:  [facility]   PHYSICIAN:  Armanda Magic, M.D.     DATE OF BIRTH:  1950/11/25   DATE OF ADMISSION:  10/24/2007  DATE OF DISCHARGE:  10/26/2007                               DISCHARGE SUMMARY   DISCHARGE DIAGNOSES:  1. Chest pain, resolved.  2. Coronary artery disease status post drug-eluting stent to the      circumflex.  3. Dyslipidemia.  4. Former smoker, smoking cessation counseling.  5. Anxiety.   HOSPITAL COURSE:  Mr. Hari is a 60 year old male patient with known  coronary artery disease, who suffered an acute inferior myocardial  infarction in February 2009.  He had 2-vessel disease on  catheterization.  At that time, he underwent PCI on the distal right  coronary artery.  His LAD had distal stenosis of 80% in the very apical  portion.   Two days prior to his admission, he began experiencing some chest  burning.  He presented to the office and was quickly hospitalized.  He  underwent a cardiac catheterization and received a percutaneous  intervention utilizing drug-eluting stent x2 to the left circumflex.  He  remained in the hospital overnight.   His lab studies showed hemoglobin of 14.2, hematocrit 40.8, platelets  162, white count 8.1, BUN 9, creatinine 1.0, sodium 139, and potassium  4.1.   He was discharged to home in stable and improved condition.   His discharge medications, enteric-coated aspirin 325 mg daily, Plavix  75 mg daily, Norvasc 10 mg daily, Prilosec 20 mg daily, Lopressor 25 mg  twice daily, enalapril 5 mg daily, Flomax 0.4 mg daily, Wellbutrin ER  150 mg twice daily, Ambien nightly p.r.n., Crestor 40 mg daily, Claritin  as needed, and sublingual nitroglycerin p.r.n. chest pain.   The patient is to clean cath site gently with soap and water.  No  scrubbing.  Increase activity  slowly.  No lifting over 10 pounds for 1  week. No driving for 2 days.  Follow up with Dr. Myra Gianotti on Tuesday,  December 02, 2007 at 3:45 p.m.      Guy Franco, P.A.      Armanda Magic, M.D.  Electronically Signed    LB/MEDQ  D:  11/27/2007  T:  11/28/2007  Job:  [id number]   cc:   Corky Crafts, MD

## 2010-10-21 NOTE — Op Note (Signed)
NAME:  Seth Washington, Seth Washington                         ACCOUNT NO.:  [id number]   MEDICAL RECORD NO.:  [id number]                   PATIENT TYPE:  AMB   LOCATION:  DSC                                  FACILITY:  [facility]   PHYSICIAN:  Harvie Junior, M.D.                DATE OF BIRTH:  10/07/1950   DATE OF PROCEDURE:  DATE OF DISCHARGE:                                 OPERATIVE REPORT   PREOPERATIVE DIAGNOSIS:  Impingement with AC joint arthritis.   POSTOPERATIVE DIAGNOSES:  1. Impingement with AC joint arthritis.  2. Massive rotator cuff tear.   PROCEDURE:  1. Mini-open rotator cuff repair of chronic massive rotator cuff tear with     acromioplasty.  2. Arthroscopic distal clavicle resection.  3. Arthroscopic debridement of rotator cuff and labral tear.   SURGEON:  Harvie Junior, M.D.   ASSISTANTJoesph July   ANESTHESIA:  General with an interscalene block.   BRIEF HISTORY:  He is a 60 year old male with a long history of having right  shoulder problems.  He also has been evaluated with an MRI, several years  ago down in [facility], where he was noted, at that time to have  significant fray and tearing of his rotator cuff and high grade partial  thickness tearing. He has had chronic shoulder pain off and on, ultimately  had not had it addressed and presented complaining of shoulder pain with  desire to have it addressed.   At that point he had good strength in rotation but had impingement and AC  joint pain.  Ultimately,we felt that after failure of conservative care,  that arthroscopic intervention was the only appropriate course of action.  He was brought to the operating room for this procedure.   DESCRIPTION OF PROCEDURE:  The patient was brought to the operating room and  after adequate anesthesia was obtained with general anesthetic the patient  was placed supine on the operating table.  The right shoulder was then was  prepped and draped in the usual sterile fashion.   Following this the patient  was moved into the beach chair position and all bony prominences were well-  padded.   Attention, at this time, was turned to the right shoulder where arthroscopic  examination of the shoulder revealed that there was some fray and tearing of  the superior and anterior labrum.  This was debrided from within the  glenohumeral joint.   Attention was turned up to the rotator cuff which was obviously torn and  this was debrided from within the glenohumeral joint. It appeared to be a  significant tear with fibrinous bands trying to make it to the tuberosity,  but not really passing the articular margin.  At this point, attention was  turned out of the glenohumeral joint and into the subacromial space and an  anterolateral acromioplasty was performed from a lateral and posterior  compartment.  Distal clavicle resection of 15 mm was then undertaken from an anterior  portal and then attention was turned towards a __________ rotator cuff.  It  was grasped with grasper.  It was able to be pulled over to close to the  articular margin.   At that point the arthroscopic portion of the case was abandoned and a mini-  open incision was made.  The  subcutaneous tissue was dissected down to the  level of the deltoid.  The deltoid was divided.  The bursa was resected  underneath and the rotator cuff was identified.  It was now obvious that it  was a chronic massive rotator cuff tear.  Significant mobilization was taken  anteriorly and posteriorly, off the coracoid anteriorly all the way back  posteriorly from the undersurface and superior surface of the cuff. At this  point, we could advance the cuff to the articular margin without tension,  advancing past the articular margin to the tuberosity.  There was a  significant amount of tension.   At this point, the anterior margin of the rotator cuff was identified, and 2  figure-of-eight sutures were placed in a side-to-side  fashion anteriorly and  this repaired to the level of the biceps tendon. Following this, 3 suture  anchors were placed in the supraspinatus back to the anterior portion of the  infraspinatus and then the infraspinatus was repaired side-to-side  posteriorly.  Excellent repair of the cuff was achieved. The patient  achieved full passive motion at this point.   Attention was returned, at this point, back to the acromioplasty; adequate  acromioplasty was then performed.  Attention was turned over to the distal  clavicle where adequate distal clavicle had been resected.   At this point, the shoulder was copiously irrigated and suctioned dry.  The  rent in the deltoid was closed with an #0 Vicryl running suture.  The subcu  was closed with 2-0 Vicryl and the skin with a 3-0 Maxon pull out suture.  Benzoin and Steri-Strips were applied.  A sterile compression dressing was  applied to the shoulder, as well as a bandage; and the patient was placed in  to an ultra sling.  Even though there was no significant tension on the  repair, with the arm at the side, it was felt that some abduction of the arm  would be helpful in maintaining tension off of the repair.  The patient was  taken to the recovery room at this point where the estimated blood loss for  the procedure had been less than 50 cc.                                               Harvie Junior, M.D.    Ranae Plumber  D:  11/07/2002  T:  11/07/2002  Job:  [id number]   cc:   Bryan Lemma. Manus Gunning, M.D.  [address] [place]  [place] [address]  Fax: [phone]

## 2010-10-21 NOTE — Cardiovascular Report (Signed)
NAMEKAYMAN, SNUFFER                           ACCOUNT NO.:  [id number]   MEDICAL RECORD NO.:  [id number]                   PATIENT TYPE:   LOCATION:                                       FACILITY:   PHYSICIAN:  Francisca December, M.D.               DATE OF BIRTH:  04-Jul-1950   DATE OF PROCEDURE:  03/06/2002  DATE OF DISCHARGE:                              CARDIAC CATHETERIZATION   PROCEDURE PERFORMED:  Percutaneous coronary intervention/drug eluding stent  implantation mid anterior descending artery.   INDICATIONS:  The patient is a 60 year old man who has developed an atypical  anginal syndrome. Because of his multiple risk factors, we preceded directly  to coronary angiography, completed this morning by Dr. Candace Cruise,  revealing the presence of a high grade stenosis in the midportion of the  anterior descending artery. He also has atherosclerotic plaquing throughout  his coronary tree without other significant obstruction.  He is brought to  the catheterization laboratory at this time to provide for percutaneous  revascularization.   DESCRIPTION OF PROCEDURE:  The patient was brought  to the cardiac  catheterization laboratory and in a postabsorptive  state. The right groin  was prepped and draped in its usual sterile fashion. Local anesthesia was  obtained with the infiltration of 1% lidocaine. The 6 French catheter sheath  was inserted percutaneously into the right femoral artery, letting an  anterior part through a guiding J-wire. The patient received 81 mg of  bivalirudin in a bolus and a constant infusion of 37.8 mL per minute which  is 1.75 mg/kg per hour.  He had also received 150 mg of Plavix pre  procedure. He had been taking aspirin.   A 6 French CLS 3.5 left guiding catheter was advanced to the ascending aorta  where the left coronary os was  engaged. A 0.014 inch Scimed Lugue  intracoronary guide wire was passed across the lesion in the mid anterior  descending  artery without difficulty. The lesion was primarily stented using  a 3.5/18 mm Cordis CYPHER intracoronary stent. It was initially deployed at  a peak pressure of 11 atmospheres. However, the stent did not appear to be  adequately deployed, therefore the stent balloon was returned and was  reinflated to 16 atmospheres. This resulted in reasonable luminal diameter.  However, there was an apparent plaque shift into the diagonal just distal  to the stented segment.   A 3.5/50 mm Scimed Quantum Maverick intracoronary balloon was deployed into  the distal portion of the stented segment and that balloon was inflated to  12 atmospheres for  approximately one minute. This balloon was also moved  slightly  distally and inflated to about 1.5 atmospheres for 1 minute in the  ongoing anterior descending artery. This balloon was then removed.   The wire  was withdrawn and then advanced into the diagonal branch. A 2.5/15  mm Scimed Quantum balloon  was advanced into the proximal diagonal and  inflated there to 2 atmospheres for 56 seconds. This balloon was removed as  well as the guide wire. Confirmation of adequate patency was obtained in  orthogonal views, both with and without the guide wire in place.   The guiding catheter was removed and the sheath was seated into place. The  patient was transported to the recovery area in stable condition with an  intact distal pulse. It should be noted the ACT was well over 300 seconds  after the initial bolus of Angiomax.   ANGIOGRAPHY:  The lesion treated was in the midportion of the  anterior  descending artery and was 85% to  90% stenotic. There was a moderate sized  third diagonal branch arising just distal to the lesion, and the ongoing  anterior descending artery was quite large, but contained luminal  irregularities. Following balloon dilatation and stent implantation in the  midportion of the anterior descending artery, there was no residual   stenosis. However, there was plaque shift and/or thrombus into the diagonal  branch, which was  only moved slightly more downstream by the above  mentioned balloon dilatation. Therefore the residual stenosis in the  diagonal branch was probably in the range of 80%. However, I felt this was  mostly thrombus and would respond to prolonged IIB IIIA inhibitors.   FINAL IMPRESSION:  1. Status post successful PTCA stent implantation in the mid anterior     descending artery.  2. Plaque shift in the proximal diagonal with some likely thrombus.  3. Typical angina was reproduced with device insertion and balloon     inflation.                                               Francisca December, M.D.    JHE/MEDQ  D:  03/06/2002  T:  03/11/2002  Job:  [id number]   cc:   Armanda Magic, M.D.  [address]  [place], [place] [address]  Fax: [phone]   Bryan Lemma. Manus Gunning, M.D.  [address] [place]  [place] [address]  Fax: [phone]

## 2010-10-21 NOTE — Discharge Summary (Signed)
NAMELILBURN, STRAW               ACCOUNT NO.:  [id number]   MEDICAL RECORD NO.:  [id number]          PATIENT TYPE:  INP   LOCATION:  2920                         FACILITY:  [facility]   PHYSICIAN:  Armanda Magic, M.D.     DATE OF BIRTH:  [birth year]   DATE OF ADMISSION:  07/25/2007  DATE OF DISCHARGE:  07/28/2007                               DISCHARGE SUMMARY   DISCHARGE DIAGNOSES:  1. Acute non-ST segment elevation myocardial infarction, status post      drug-eluting stent to the right coronary artery.  2. Dyslipidemia, treated.  3. Smoking, smoking cessation counseling.  4. Long-term medication use.   HISTORY:  Mr. Meisinger is a 60 year old male patient who is admitted on  July 25, 2007, for substernal chest pain.  Plans were for him to  have a stress Cardiolite; however,  he developed sudden onset of severe  substernal chest pain with radiation into the neck bilaterally along  with some shortness of breath.  He became hypertensive with a blood  pressure of 168/107.  Troponin initially peaked at 0.07, and it was felt  that it was best to take him straight to the cardiac catheterization  lab.  He was found to have 60%-70% proximal circumflex lesions, a 50%  proximal LAD lesion with a 90% diagonal lesion, the distal LAD had a 60%  followed by 80% stenosis.  Right coronary artery showed luminal  irregularities, the PDA was occluded.   Dr. Katrinka Blazing then intervened, placing a drug-eluting stent in the distal  right coronary artery successfully.  Please note, the patient's EKG  during this whole, entire time did not show any changes whatsoever.   The patient was watched in the hospital for 2 more days and then  discharged to home.   Labs studies during the hospitalization show white count 13.1,  hemoglobin 16.6, hematocrit 46.8, and platelets 222.  Sodium 140,  potassium 3.9, BUN of 11, and creatinine 0.89.  Maximum CK of 19.45 with  MB fraction of 290.6 and maximum troponin at 64.34.   Total cholesterol  171, HDL 30, LDL 122, and triglycerides 93.   DISCHARGE MEDICATIONS:  1. Enteric-coated aspirin 325 mg a day.  2. Plavix 75 mg a day.  3. Claritin p.r.n.  4. Omeprazole p.r.n.  5. Zocor 80 mg a day.  6. Lopressor 25 mg 1 p.o. b.i.d.  7. Altace 5 mg 1 tablet daily.  8. Sublingual nitroglycerin p.r.n. chest pain.   DISCHARGE INSTRUCTIONS:  He was instructed to stop smoking.  Clean the  cath site gently with soap and water and no scrubbing. Activity as  instructed by cardiac rehabilitation.  No lifting over 10 pounds for 1  week.  No driving for 2 days.  Increase activity slowly.  Maintain low-  sodium, heart healthy diet.   FOLLOWUP:  Follow up with Dr. Mayford Knife on July 31, 2007, at 1:30 p.m.      Guy Franco, P.A.      Armanda Magic, M.D.  Electronically Signed    LB/MEDQ  D:  08/25/2007  T:  08/26/2007  Job:  [id number]

## 2011-02-24 LAB — COMPREHENSIVE METABOLIC PANEL
AST: 162 — ABNORMAL HIGH
Albumin: 3.6
Alkaline Phosphatase: 61
Chloride: 104
GFR calc Af Amer: >60
Potassium: 3.7
Total Bilirubin: 0.6

## 2011-02-24 LAB — CBC
HCT: 46.2
HCT: 46.8
Hemoglobin: 15.9
Hemoglobin: 16.6
MCHC: 35.5
MCV: 90.4
Platelets: 215
RDW: 12.4
WBC: 16.2 — ABNORMAL HIGH

## 2011-02-24 LAB — I-STAT 8, (EC8 V) (CONVERTED LAB)
BUN: 10
Bicarbonate: 26.3 — ABNORMAL HIGH
HCT: 50
Hemoglobin: 17
Operator id: 272551
Potassium: 3.4 — ABNORMAL LOW
Sodium: 137

## 2011-02-24 LAB — POCT CARDIAC MARKERS: Troponin i, poc: <0.05

## 2011-02-24 LAB — CK TOTAL AND CKMB (NOT AT ARMC)
CK, MB: 3.7
Total CK: 429 — ABNORMAL HIGH

## 2011-02-24 LAB — DIFFERENTIAL
Basophils Absolute: 0.2 — ABNORMAL HIGH
Basophils Relative: 1
Eosinophils Absolute: 0.4
Eosinophils Relative: 3
Monocytes Absolute: 1

## 2011-02-24 LAB — LIPID PANEL
LDL Cholesterol: 122 — ABNORMAL HIGH
Total CHOL/HDL Ratio: 5.7
Triglycerides: 93
VLDL: 19

## 2011-02-24 LAB — BASIC METABOLIC PANEL
BUN: 11
BUN: 5 — ABNORMAL LOW
Calcium: 9
Chloride: 103
GFR calc non Af Amer: >60
Glucose, Bld: 122 — ABNORMAL HIGH
Potassium: 3.9
Potassium: 4.2
Sodium: 140

## 2011-02-24 LAB — CARDIAC PANEL(CRET KIN+CKTOT+MB+TROPI)
CK, MB: 290.6 — ABNORMAL HIGH
CK, MB: 98.4 — ABNORMAL HIGH
Relative Index: 13.1 — ABNORMAL HIGH
Relative Index: 18 — ABNORMAL HIGH

## 2011-03-01 LAB — COMPREHENSIVE METABOLIC PANEL
AST: 28
Albumin: 3.9
Alkaline Phosphatase: 53
Chloride: 105
Creatinine, Ser: 0.99
GFR calc Af Amer: >60
Potassium: 4.7
Total Bilirubin: 1
Total Protein: 6.8

## 2011-03-01 LAB — DIFFERENTIAL
Basophils Absolute: 0
Eosinophils Relative: 4
Lymphocytes Relative: 27
Monocytes Absolute: 0.9
Monocytes Relative: 11
Neutro Abs: 4.8

## 2011-03-01 LAB — CK TOTAL AND CKMB (NOT AT ARMC)
CK, MB: 2.3
Relative Index: 1.8

## 2011-03-01 LAB — BASIC METABOLIC PANEL
BUN: 7
BUN: 9
Calcium: 8.6
Chloride: 106
Creatinine, Ser: 0.8
Creatinine, Ser: 1.01
GFR calc non Af Amer: >60
Glucose, Bld: 86
Potassium: 4.1

## 2011-03-01 LAB — APTT: aPTT: 26

## 2011-03-01 LAB — CBC
HCT: 42.3
Hemoglobin: 14.7
MCHC: 34.2
MCV: 92
Platelets: 162
Platelets: 187
RDW: 12.7
RDW: 12.8
WBC: 8.1
WBC: 8.4
WBC: 8.8

## 2011-03-01 LAB — HEPARIN LEVEL (UNFRACTIONATED)
Heparin Unfractionated: 0.19 — ABNORMAL LOW
Heparin Unfractionated: 0.63

## 2011-03-01 LAB — PROTIME-INR: INR: 1

## 2011-05-08 ENCOUNTER — Other Ambulatory Visit: Payer: Self-pay | Admitting: Pain Medicine

## 2011-05-15 ENCOUNTER — Other Ambulatory Visit: Payer: Self-pay | Admitting: Pain Medicine

## 2011-05-15 ENCOUNTER — Encounter (HOSPITAL_BASED_OUTPATIENT_CLINIC_OR_DEPARTMENT_OTHER): Payer: Self-pay | Admitting: *Deleted

## 2011-05-15 NOTE — Progress Notes (Signed)
Will have Dr Acey Lav review chart. Plan  to move him to main due to his comorbitities.message left with Clydie Braun in [name] office at 1300.

## 2011-05-16 ENCOUNTER — Encounter (HOSPITAL_COMMUNITY): Payer: Self-pay | Admitting: Pharmacy Technician

## 2011-05-16 ENCOUNTER — Encounter (HOSPITAL_BASED_OUTPATIENT_CLINIC_OR_DEPARTMENT_OTHER): Admission: RE | Payer: Self-pay | Source: Ambulatory Visit

## 2011-05-16 ENCOUNTER — Ambulatory Visit (HOSPITAL_BASED_OUTPATIENT_CLINIC_OR_DEPARTMENT_OTHER): Admission: RE | Admit: 2011-05-16 | Source: Ambulatory Visit | Admitting: Specialist

## 2011-05-16 HISTORY — DX: Atherosclerotic heart disease of native coronary artery without angina pectoris: I25.10

## 2011-05-16 HISTORY — DX: Mental disorder, not otherwise specified: F99

## 2011-05-16 HISTORY — DX: Essential (primary) hypertension: I10

## 2011-05-16 HISTORY — DX: Benign prostatic hyperplasia without lower urinary tract symptoms: N40.0

## 2011-05-16 HISTORY — DX: Hyperlipidemia, unspecified: E78.5

## 2011-05-16 SURGERY — SHOULDER ARTHROSCOPY WITH SUBACROMIAL DECOMPRESSION, ROTATOR CUFF REPAIR AND BICEP TENDON REPAIR
Anesthesia: General | Laterality: Right

## 2011-05-18 ENCOUNTER — Encounter (HOSPITAL_COMMUNITY): Payer: Self-pay

## 2011-05-18 ENCOUNTER — Encounter (HOSPITAL_COMMUNITY)
Admission: RE | Admit: 2011-05-18 | Discharge: 2011-05-18 | Disposition: A | Discharge status: Home / Self Care | Payer: Medicare Other | Source: Ambulatory Visit | Attending: Specialist | Admitting: Specialist

## 2011-05-18 ENCOUNTER — Ambulatory Visit (HOSPITAL_COMMUNITY)
Admission: RE | Admit: 2011-05-18 | Discharge: 2011-05-18 | Disposition: A | Discharge status: Home / Self Care | Payer: Medicare Other | Source: Ambulatory Visit | Attending: Specialist | Admitting: Specialist

## 2011-05-18 DIAGNOSIS — Z01818 Encounter for other preprocedural examination: Secondary | ICD-10-CM | POA: Insufficient documentation

## 2011-05-18 DIAGNOSIS — Z01812 Encounter for preprocedural laboratory examination: Secondary | ICD-10-CM | POA: Insufficient documentation

## 2011-05-18 HISTORY — DX: Unspecified osteoarthritis, unspecified site: M19.90

## 2011-05-18 HISTORY — DX: Gastro-esophageal reflux disease without esophagitis: K21.9

## 2011-05-18 LAB — CBC
HCT: 42.2 % (ref 39.0–52.0)
Hemoglobin: 14.7 g/dL (ref 13.0–17.0)
MCV: 89.2 fL (ref 78.0–100.0)
Platelets: 159 10*3/uL (ref 150–400)
RBC: 4.73 MIL/uL (ref 4.22–5.81)
WBC: 7.4 10*3/uL (ref 4.0–10.5)

## 2011-05-18 LAB — BASIC METABOLIC PANEL
BUN: 14 mg/dL (ref 6–23)
CO2: 27 mEq/L (ref 19–32)
Chloride: 100 mEq/L (ref 96–112)
Creatinine, Ser: 1.04 mg/dL (ref 0.50–1.35)
Glucose, Bld: 98 mg/dL (ref 70–99)

## 2011-05-18 MED ORDER — CEFAZOLIN SODIUM 1-5 GM-% IV SOLN: 1.0000 g | INTRAVENOUS | Status: DC

## 2011-05-18 NOTE — Pre-Procedure Instructions (Signed)
04/24/11 ekg on chart  Stress test 04/24/11 on chart  05/01/11 office visit with Dr Armanda Magic cardiologist on chart  Echo 04/07/10 on chart  Cath 09/30/09 on chart

## 2011-05-18 NOTE — Patient Instructions (Signed)
20 Seth Washington  05/18/2011   Your procedure is scheduled on:  05/23/11 230pm-430pm  Report to [facility] at 1230pm  Call this number if you have problems the morning of surgery: [phone]   Remember:   Do not eat food:After Midnight.  May have clear liquids until 0800 am then npo   Clear liquids include soda, tea, black coffee, apple or grape juice, broth.  Take these medicines the morning of surgery with A SIP OF WATER:    Do not wear jewelry,   Do not wear lotions, powders, or perfumes.    Do not bring valuables to the hospital.  Contacts, dentures or bridgework may not be worn into surgery.     Patients discharged the day of surgery will not be allowed to drive home.  Name and phone number of your driver:  Special Instructions: CHG Shower Use Special Wash: 1/2 bottle night before surgery and 1/2 bottle morning of surgery. shower chin to toes with CHG. Wash face and private parts with regular soap.    Please read over the following fact sheets that you were given: MRSA Information, coughing and deep breathing exercises and leg exercises.

## 2011-05-23 ENCOUNTER — Encounter (HOSPITAL_COMMUNITY): Payer: Self-pay | Admitting: *Deleted

## 2011-05-23 ENCOUNTER — Encounter (HOSPITAL_BASED_OUTPATIENT_CLINIC_OR_DEPARTMENT_OTHER): Payer: Self-pay | Admitting: Anesthesiology

## 2011-05-23 ENCOUNTER — Ambulatory Visit (HOSPITAL_BASED_OUTPATIENT_CLINIC_OR_DEPARTMENT_OTHER): Payer: Medicare Other | Admitting: Anesthesiology

## 2011-05-23 ENCOUNTER — Observation Stay (HOSPITAL_COMMUNITY)
Admission: RE | Admit: 2011-05-23 | Discharge: 2011-05-24 | Disposition: A | Discharge status: Home / Self Care | Payer: Medicare Other | Source: Ambulatory Visit | Attending: Specialist | Admitting: Specialist

## 2011-05-23 ENCOUNTER — Encounter (HOSPITAL_COMMUNITY)
Admission: RE | Disposition: A | Discharge status: Home / Self Care | Payer: Self-pay | Source: Ambulatory Visit | Attending: Specialist

## 2011-05-23 DIAGNOSIS — S43429A Sprain of unspecified rotator cuff capsule, initial encounter: Secondary | ICD-10-CM | POA: Insufficient documentation

## 2011-05-23 DIAGNOSIS — X58XXXA Exposure to other specified factors, initial encounter: Secondary | ICD-10-CM | POA: Insufficient documentation

## 2011-05-23 DIAGNOSIS — Z7982 Long term (current) use of aspirin: Secondary | ICD-10-CM | POA: Insufficient documentation

## 2011-05-23 DIAGNOSIS — M129 Arthropathy, unspecified: Secondary | ICD-10-CM | POA: Insufficient documentation

## 2011-05-23 DIAGNOSIS — M19019 Primary osteoarthritis, unspecified shoulder: Secondary | ICD-10-CM | POA: Insufficient documentation

## 2011-05-23 DIAGNOSIS — I10 Essential (primary) hypertension: Secondary | ICD-10-CM | POA: Insufficient documentation

## 2011-05-23 DIAGNOSIS — I252 Old myocardial infarction: Secondary | ICD-10-CM | POA: Insufficient documentation

## 2011-05-23 DIAGNOSIS — G4730 Sleep apnea, unspecified: Secondary | ICD-10-CM | POA: Insufficient documentation

## 2011-05-23 DIAGNOSIS — Z9889 Other specified postprocedural states: Secondary | ICD-10-CM

## 2011-05-23 DIAGNOSIS — Z79899 Other long term (current) drug therapy: Secondary | ICD-10-CM | POA: Insufficient documentation

## 2011-05-23 DIAGNOSIS — E785 Hyperlipidemia, unspecified: Secondary | ICD-10-CM | POA: Insufficient documentation

## 2011-05-23 DIAGNOSIS — E119 Type 2 diabetes mellitus without complications: Secondary | ICD-10-CM | POA: Insufficient documentation

## 2011-05-23 DIAGNOSIS — E669 Obesity, unspecified: Secondary | ICD-10-CM | POA: Insufficient documentation

## 2011-05-23 DIAGNOSIS — I2510 Atherosclerotic heart disease of native coronary artery without angina pectoris: Secondary | ICD-10-CM | POA: Insufficient documentation

## 2011-05-23 DIAGNOSIS — S43499A Other sprain of unspecified shoulder joint, initial encounter: Principal | ICD-10-CM | POA: Insufficient documentation

## 2011-05-23 DIAGNOSIS — N400 Enlarged prostate without lower urinary tract symptoms: Secondary | ICD-10-CM | POA: Insufficient documentation

## 2011-05-23 DIAGNOSIS — K219 Gastro-esophageal reflux disease without esophagitis: Secondary | ICD-10-CM | POA: Insufficient documentation

## 2011-05-23 SURGERY — SHOULDER ARTHROSCOPY WITH SUBACROMIAL DECOMPRESSION, ROTATOR CUFF REPAIR AND BICEP TENDON REPAIR
Anesthesia: General | Site: Shoulder | Laterality: Right | Wound class: Clean

## 2011-05-23 MED ORDER — METHOCARBAMOL 100 MG/ML IJ SOLN
500.0000 mg | Freq: Four times a day (QID) | INTRAVENOUS | Status: DC | PRN
  Filled 2011-05-23: qty 5

## 2011-05-23 MED ORDER — CEFAZOLIN SODIUM-DEXTROSE 2-3 GM-% IV SOLR: 2.0000 g | Freq: Once | INTRAVENOUS | Status: DC

## 2011-05-23 MED ORDER — MENTHOL 3 MG MT LOZG: 1.0000 | LOZENGE | OROMUCOSAL | Status: DC | PRN

## 2011-05-23 MED ORDER — CLOPIDOGREL BISULFATE 75 MG PO TABS
75.0000 mg | ORAL_TABLET | Freq: Every day | ORAL | Status: DC
  Administered 2011-05-23: 75 mg via ORAL
  Filled 2011-05-23 (×2): qty 1

## 2011-05-23 MED ORDER — FENTANYL CITRATE 0.05 MG/ML IJ SOLN
50.0000 ug | INTRAMUSCULAR | Status: DC | PRN
Start: 2011-05-23 — End: 2011-05-23
  Administered 2011-05-23: 50 ug via INTRAVENOUS

## 2011-05-23 MED ORDER — SODIUM CHLORIDE 0.9 % IV SOLN: INTRAVENOUS | Status: DC

## 2011-05-23 MED ORDER — MIDAZOLAM HCL 2 MG/2ML IJ SOLN
INTRAMUSCULAR | Status: AC
  Filled 2011-05-23: qty 2

## 2011-05-23 MED ORDER — ACETAMINOPHEN 650 MG RE SUPP: 650.0000 mg | Freq: Four times a day (QID) | RECTAL | Status: DC | PRN

## 2011-05-23 MED ORDER — CEFAZOLIN SODIUM 1-5 GM-% IV SOLN
INTRAVENOUS | Status: DC | PRN
  Administered 2011-05-23: 2 g via INTRAVENOUS

## 2011-05-23 MED ORDER — DOCUSATE SODIUM 100 MG PO CAPS
100.0000 mg | ORAL_CAPSULE | Freq: Two times a day (BID) | ORAL | Status: DC
  Administered 2011-05-23: 100 mg via ORAL
  Filled 2011-05-23 (×3): qty 1

## 2011-05-23 MED ORDER — CEFAZOLIN SODIUM-DEXTROSE 2-3 GM-% IV SOLR
2.0000 g | Freq: Four times a day (QID) | INTRAVENOUS | Status: DC
  Administered 2011-05-23 – 2011-05-24 (×2): 2 g via INTRAVENOUS
  Filled 2011-05-23 (×3): qty 50

## 2011-05-23 MED ORDER — ROPIVACAINE HCL 5 MG/ML IJ SOLN
INTRAMUSCULAR | Status: AC
  Filled 2011-05-23: qty 30

## 2011-05-23 MED ORDER — METOCLOPRAMIDE HCL 10 MG PO TABS: 5.0000 mg | ORAL_TABLET | Freq: Three times a day (TID) | ORAL | Status: DC | PRN

## 2011-05-23 MED ORDER — ONDANSETRON HCL 4 MG/2ML IJ SOLN: 4.0000 mg | Freq: Four times a day (QID) | INTRAMUSCULAR | Status: DC | PRN

## 2011-05-23 MED ORDER — HYDROMORPHONE HCL PF 1 MG/ML IJ SOLN: 0.2500 mg | INTRAMUSCULAR | Status: DC | PRN

## 2011-05-23 MED ORDER — BUPIVACAINE HCL (PF) 0.25 % IJ SOLN
INTRAMUSCULAR | Status: DC | PRN
  Administered 2011-05-23 (×2): 30 mL via INTRA_ARTICULAR

## 2011-05-23 MED ORDER — TRAZODONE HCL 150 MG PO TABS
150.0000 mg | ORAL_TABLET | Freq: Every day | ORAL | Status: DC
  Administered 2011-05-23: 150 mg via ORAL
  Filled 2011-05-23 (×2): qty 1

## 2011-05-23 MED ORDER — LACTATED RINGERS IV SOLN: INTRAVENOUS | Status: DC

## 2011-05-23 MED ORDER — FENTANYL CITRATE 0.05 MG/ML IJ SOLN: 25.0000 ug | INTRAMUSCULAR | Status: DC | PRN

## 2011-05-23 MED ORDER — ONDANSETRON HCL 4 MG/2ML IJ SOLN
INTRAMUSCULAR | Status: DC | PRN
  Administered 2011-05-23: 4 mg via INTRAVENOUS

## 2011-05-23 MED ORDER — QUETIAPINE FUMARATE 25 MG PO TABS
25.0000 mg | ORAL_TABLET | Freq: Every day | ORAL | Status: DC
  Administered 2011-05-23: 25 mg via ORAL
  Filled 2011-05-23 (×2): qty 1

## 2011-05-23 MED ORDER — LINAGLIPTIN 5 MG PO TABS
5.0000 mg | ORAL_TABLET | Freq: Every day | ORAL | Status: DC
Start: 2011-05-24 — End: 2011-05-24
  Administered 2011-05-24: 5 mg via ORAL
  Filled 2011-05-23: qty 1

## 2011-05-23 MED ORDER — ASPIRIN 81 MG PO CHEW
81.0000 mg | CHEWABLE_TABLET | Freq: Every day | ORAL | Status: DC
  Administered 2011-05-23: 81 mg via ORAL
  Filled 2011-05-23 (×2): qty 1

## 2011-05-23 MED ORDER — SUCCINYLCHOLINE CHLORIDE 20 MG/ML IJ SOLN
INTRAMUSCULAR | Status: DC | PRN
  Administered 2011-05-23: 100 mg via INTRAVENOUS

## 2011-05-23 MED ORDER — ACETAMINOPHEN 10 MG/ML IV SOLN
INTRAVENOUS | Status: DC | PRN
  Administered 2011-05-23: 1000 mg via INTRAVENOUS

## 2011-05-23 MED ORDER — ENALAPRIL MALEATE 10 MG PO TABS
10.0000 mg | ORAL_TABLET | Freq: Every day | ORAL | Status: DC
  Administered 2011-05-23: 10 mg via ORAL
  Filled 2011-05-23 (×2): qty 1

## 2011-05-23 MED ORDER — POVIDONE-IODINE 7.5 % EX SOLN: Freq: Once | CUTANEOUS | Status: DC

## 2011-05-23 MED ORDER — ONDANSETRON HCL 4 MG PO TABS: 4.0000 mg | ORAL_TABLET | Freq: Four times a day (QID) | ORAL | Status: DC | PRN

## 2011-05-23 MED ORDER — ROSUVASTATIN CALCIUM 20 MG PO TABS
20.0000 mg | ORAL_TABLET | Freq: Every day | ORAL | Status: DC
  Administered 2011-05-23: 20 mg via ORAL
  Filled 2011-05-23 (×2): qty 1

## 2011-05-23 MED ORDER — ROPIVACAINE HCL 5 MG/ML IJ SOLN
INTRAMUSCULAR | Status: DC | PRN
  Administered 2011-05-23: 30 mL

## 2011-05-23 MED ORDER — FENTANYL CITRATE 0.05 MG/ML IJ SOLN
INTRAMUSCULAR | Status: AC
  Filled 2011-05-23: qty 2

## 2011-05-23 MED ORDER — SERTRALINE HCL 50 MG PO TABS
50.0000 mg | ORAL_TABLET | Freq: Every day | ORAL | Status: DC
  Administered 2011-05-23: 50 mg via ORAL
  Filled 2011-05-23 (×2): qty 1

## 2011-05-23 MED ORDER — FENTANYL CITRATE 0.05 MG/ML IJ SOLN
INTRAMUSCULAR | Status: DC | PRN
  Administered 2011-05-23: 100 ug via INTRAVENOUS

## 2011-05-23 MED ORDER — INSULIN ASPART 100 UNIT/ML ~~LOC~~ SOLN
0.0000 [IU] | Freq: Three times a day (TID) | SUBCUTANEOUS | Status: DC
  Administered 2011-05-24: 5 [IU] via SUBCUTANEOUS
  Filled 2011-05-23: qty 3

## 2011-05-23 MED ORDER — CEFAZOLIN SODIUM-DEXTROSE 2-3 GM-% IV SOLR
INTRAVENOUS | Status: AC
  Filled 2011-05-23: qty 50

## 2011-05-23 MED ORDER — ACETAMINOPHEN 10 MG/ML IV SOLN
INTRAVENOUS | Status: AC
  Filled 2011-05-23: qty 100

## 2011-05-23 MED ORDER — ENOXAPARIN SODIUM 40 MG/0.4ML ~~LOC~~ SOLN
40.0000 mg | SUBCUTANEOUS | Status: DC
  Filled 2011-05-23: qty 0.4

## 2011-05-23 MED ORDER — MIDAZOLAM HCL 10 MG/2ML IJ SOLN: 1.0000 mg | INTRAMUSCULAR | Status: DC | PRN

## 2011-05-23 MED ORDER — PROPOFOL 10 MG/ML IV EMUL
INTRAVENOUS | Status: DC | PRN
  Administered 2011-05-23: 170 mg via INTRAVENOUS

## 2011-05-23 MED ORDER — BUPIVACAINE HCL 0.25 % IJ SOLN
INTRAMUSCULAR | Status: AC
  Filled 2011-05-23: qty 1

## 2011-05-23 MED ORDER — METHOCARBAMOL 500 MG PO TABS: 500.0000 mg | ORAL_TABLET | Freq: Four times a day (QID) | ORAL | Status: DC | PRN

## 2011-05-23 MED ORDER — BUPIVACAINE-EPINEPHRINE PF 0.25-1:200000 % IJ SOLN
INTRAMUSCULAR | Status: AC
  Filled 2011-05-23: qty 30

## 2011-05-23 MED ORDER — METOCLOPRAMIDE HCL 5 MG/ML IJ SOLN: 5.0000 mg | Freq: Three times a day (TID) | INTRAMUSCULAR | Status: DC | PRN

## 2011-05-23 MED ORDER — PHENOL 1.4 % MT LIQD: 1.0000 | OROMUCOSAL | Status: DC | PRN

## 2011-05-23 MED ORDER — TAMSULOSIN HCL 0.4 MG PO CAPS
0.4000 mg | ORAL_CAPSULE | Freq: Every day | ORAL | Status: DC
  Administered 2011-05-23: 0.4 mg via ORAL
  Filled 2011-05-23 (×2): qty 1

## 2011-05-23 MED ORDER — PANTOPRAZOLE SODIUM 40 MG PO TBEC
40.0000 mg | DELAYED_RELEASE_TABLET | Freq: Every day | ORAL | Status: DC
  Administered 2011-05-23: 40 mg via ORAL
  Filled 2011-05-23 (×2): qty 1

## 2011-05-23 MED ORDER — NITROGLYCERIN 0.4 MG SL SUBL: 0.4000 mg | SUBLINGUAL_TABLET | SUBLINGUAL | Status: DC | PRN

## 2011-05-23 MED ORDER — METOPROLOL TARTRATE 50 MG PO TABS
50.0000 mg | ORAL_TABLET | Freq: Every day | ORAL | Status: DC
  Administered 2011-05-23: 50 mg via ORAL
  Filled 2011-05-23 (×2): qty 1

## 2011-05-23 MED ORDER — OXYCODONE HCL 5 MG PO TABS: 5.0000 mg | ORAL_TABLET | ORAL | Status: DC | PRN

## 2011-05-23 MED ORDER — ALUM & MAG HYDROXIDE-SIMETH 200-200-20 MG/5ML PO SUSP: 30.0000 mL | ORAL | Status: DC | PRN

## 2011-05-23 MED ORDER — GLYCOPYRROLATE 0.2 MG/ML IJ SOLN
INTRAMUSCULAR | Status: DC | PRN
  Administered 2011-05-23: .3 mg via INTRAVENOUS

## 2011-05-23 MED ORDER — ACETAMINOPHEN 325 MG PO TABS: 650.0000 mg | ORAL_TABLET | Freq: Four times a day (QID) | ORAL | Status: DC | PRN

## 2011-05-23 MED ORDER — HYDROMORPHONE HCL PF 1 MG/ML IJ SOLN: 0.5000 mg | INTRAMUSCULAR | Status: DC | PRN

## 2011-05-23 MED ORDER — LACTATED RINGERS IV SOLN
INTRAVENOUS | Status: DC
  Administered 2011-05-23: 18:00:00 via INTRAVENOUS
  Administered 2011-05-23: 1000 mL via INTRAVENOUS

## 2011-05-23 MED ORDER — PROMETHAZINE HCL 25 MG/ML IJ SOLN: 6.2500 mg | INTRAMUSCULAR | Status: DC | PRN

## 2011-05-23 MED ORDER — MIDAZOLAM HCL 2 MG/2ML IJ SOLN
1.0000 mg | INTRAMUSCULAR | Status: DC | PRN
  Administered 2011-05-23: 2 mg via INTRAVENOUS

## 2011-05-23 MED ORDER — EZETIMIBE 10 MG PO TABS
10.0000 mg | ORAL_TABLET | Freq: Every day | ORAL | Status: DC
  Administered 2011-05-23: 10 mg via ORAL
  Filled 2011-05-23 (×2): qty 1

## 2011-05-23 MED ORDER — AMLODIPINE BESYLATE 10 MG PO TABS
10.0000 mg | ORAL_TABLET | Freq: Every day | ORAL | Status: DC
  Administered 2011-05-23: 10 mg via ORAL
  Filled 2011-05-23 (×2): qty 1

## 2011-05-23 MED ORDER — MIDAZOLAM HCL 5 MG/5ML IJ SOLN
INTRAMUSCULAR | Status: DC | PRN
  Administered 2011-05-23: 2 mg via INTRAVENOUS

## 2011-05-23 SURGICAL SUPPLY — 61 items
ANCH SUT PUSHLCK 19.5X3.5 STRL (Anchor) ×1 IMPLANT
ANCH SUT PUSHLCK 24X4.5 STRL (Orthopedic Implant) ×2 IMPLANT
ANCHOR PUSHLOCK PEEK 3.5X19.5 (Anchor) ×2 IMPLANT
APL SKNCLS STERI-STRIP NONHPOA (GAUZE/BANDAGES/DRESSINGS) ×1
BAG SPEC THK2 15X12 ZIP CLS (MISCELLANEOUS) ×1
BAG ZIPLOCK 12X15 (MISCELLANEOUS) ×2 IMPLANT
BENZOIN TINCTURE PRP APPL 2/3 (GAUZE/BANDAGES/DRESSINGS) ×2 IMPLANT
BLADE CUDA GRT WHITE 3.5 (BLADE) ×2 IMPLANT
BLADE CUDA SHAVER 3.5 (BLADE) ×2 IMPLANT
BLADE GREAT WHITE 4.2 (BLADE) ×2 IMPLANT
BLADE SURG SZ11 CARB STEEL (BLADE) ×2 IMPLANT
BOOTIES KNEE HIGH SLOAN (MISCELLANEOUS) ×4 IMPLANT
BUR OVAL 6.0 (BURR) ×1 IMPLANT
CANNULA 5.75X7 CRYSTAL CLEAR (CANNULA) ×1 IMPLANT
CANNULA TWIST IN 8.25X7CM (CANNULA) ×4 IMPLANT
CLOTH BEACON ORANGE TIMEOUT ST (SAFETY) ×2 IMPLANT
DRAPE ORTHO 2.5IN SPLIT 77X108 (DRAPES) IMPLANT
DRAPE ORTHO SPLIT 77X108 STRL (DRAPES)
DRAPE POUCH INSTRU U-SHP 10X18 (DRAPES) ×2 IMPLANT
DRAPE STERI 35X30 U-POUCH (DRAPES) ×2 IMPLANT
DRAPE SURG 17X11 SM STRL (DRAPES) ×2 IMPLANT
DRAPE U-SHAPE 47X51 STRL (DRAPES) ×2 IMPLANT
DRSG PAD ABDOMINAL 8X10 ST (GAUZE/BANDAGES/DRESSINGS) ×1 IMPLANT
ELECT REM PT RETURN 9FT ADLT (ELECTROSURGICAL) ×2
ELECTRODE REM PT RTRN 9FT ADLT (ELECTROSURGICAL) ×1 IMPLANT
FIBERSTICK 2 (SUTURE) ×4 IMPLANT
GAUZE SPONGE 4X4 12PLY STRL LF (GAUZE/BANDAGES/DRESSINGS) ×1 IMPLANT
GAUZE XEROFORM 1X8 LF (GAUZE/BANDAGES/DRESSINGS) ×2 IMPLANT
GLOVE ECLIPSE 8.0 STRL XLNG CF (GLOVE) ×2 IMPLANT
GLOVE ORTHO TXT STRL SZ7.5 (GLOVE) ×2 IMPLANT
GOWN STRL REIN XL XLG (GOWN DISPOSABLE) ×2 IMPLANT
KIT BASIN OR (CUSTOM PROCEDURE TRAY) ×2 IMPLANT
KIT SHOULDER TRACTION (DRAPES) ×2 IMPLANT
LASSO SUT 90 DEGREE (SUTURE) ×2 IMPLANT
MANIFOLD NEPTUNE II (INSTRUMENTS) ×2 IMPLANT
NDL HYPO 20X1 EYE RM 11 (NEEDLE) ×1 IMPLANT
NDL SPNL 18GX3.5 QUINCKE PK (NEEDLE) ×1 IMPLANT
NEEDLE HYPO 20X1 EYE RM 11 (NEEDLE) ×2 IMPLANT
NEEDLE HYPO 22GX1.5 SAFETY (NEEDLE) ×2 IMPLANT
NEEDLE SPNL 18GX3.5 QUINCKE PK (NEEDLE) ×2 IMPLANT
NS IRRIG 1000ML POUR BTL (IV SOLUTION) ×2 IMPLANT
PACK SHOULDER CUSTOM OPM052 (CUSTOM PROCEDURE TRAY) ×2 IMPLANT
POSITIONER SURGICAL ARM (MISCELLANEOUS) ×2 IMPLANT
PUSHLOCK PEEK 4.5X24 (Orthopedic Implant) ×4 IMPLANT
SET ARTHROSCOPY TUBING (MISCELLANEOUS) ×2
SET ARTHROSCOPY TUBING PVC (MISCELLANEOUS) ×1 IMPLANT
SLING ARM IMMOBILIZER LRG (SOFTGOODS) IMPLANT
SLING ARM IMMOBILIZER MED (SOFTGOODS) IMPLANT
SLING ARM LGE (CAST SUPPLIES) ×1 IMPLANT
SUT ETHILON 3 0 PS 1 (SUTURE) ×2 IMPLANT
SUT FIBERWIRE #2 38 T-5 BLUE (SUTURE)
SUT LASSO 45 DEGREE LEFT (SUTURE) ×2 IMPLANT
SUT LASSO 45D RIGHT (SUTURE) ×2 IMPLANT
SUT PDS AB 0 CT 36 (SUTURE) IMPLANT
SUTURE FIBERWR #2 38 T-5 BLUE (SUTURE) IMPLANT
SYR 20CC LL (SYRINGE) ×2 IMPLANT
TAPE HYPAFIX 4 X10 (GAUZE/BANDAGES/DRESSINGS) ×1 IMPLANT
TOWEL OR 17X26 10 PK STRL BLUE (TOWEL DISPOSABLE) ×4 IMPLANT
TUBING CONNECTING 10 (TUBING) ×2 IMPLANT
WAND 90 DEG TURBOVAC W/CORD (SURGICAL WAND) ×2 IMPLANT
WATER STERILE IRR 1500ML POUR (IV SOLUTION) ×2 IMPLANT

## 2011-05-23 NOTE — Transfer of Care (Signed)
Immediate Anesthesia Transfer of Care Note  Patient: Seth Washington  Procedure(s) Performed:  SHOULDER ARTHROSCOPY WITH SUBACROMIAL DECOMPRESSION, ROTATOR CUFF REPAIR AND BICEP TENDON REPAIR - Right Shoulder Arthroscopy/Subacromial Decompression/Labial Debridement/Biceps Tendotomy/Rotator Cuff Repair  Patient Location: PACU  Anesthesia Type: GA combined with regional for post-op pain  Level of Consciousness: sedated and patient cooperative  Airway & Oxygen Therapy: Patient Spontanous Breathing and Patient connected to face mask oxygen  Post-op Assessment: Report given to PACU RN and Post -op Vital signs reviewed and stable  Post vital signs: Reviewed and stable  Complications: No apparent anesthesia complications

## 2011-05-23 NOTE — Progress Notes (Signed)
Patient using his own cpap unit and mask. Chord inspected for electrical safety and plugged into the red outlet. Vital signs stable with 2lpm O2 bleed in.

## 2011-05-23 NOTE — Anesthesia Preprocedure Evaluation (Addendum)
Anesthesia Evaluation  Patient identified by MRN, date of birth, ID band Patient awake    Reviewed: Allergy & Precautions, H&P , NPO status , Patient's Chart, lab work & pertinent test results, reviewed documented beta blocker date and time   History of Anesthesia Complications Negative for: history of anesthetic complications  Airway Mallampati: III TM Distance: >3 FB Neck ROM: full    Dental No notable dental hx. (+) Teeth Intact, Dental Advisory Given and Caps   Pulmonary neg pulmonary ROS, sleep apnea and Continuous Positive Airway Pressure Ventilation ,  clear to auscultation  Pulmonary exam normal       Cardiovascular Exercise Tolerance: Good hypertension, On Home Beta Blockers + CAD, + Past MI (MI in April 2011 following cervical disc surgery.) and + Cardiac Stents (Cardiac stents X 4; last stent 2009. Patient denies cardiac symptoms, cleared for surgical procedure  by cardiologist.) regular Normal angioplasty   Neuro/Psych PSYCHIATRIC DISORDERS PTSDCervical diskectomy Negative Neurological ROS  Negative Psych ROS   GI/Hepatic negative GI ROS, Neg liver ROS, GERD-  Medicated and Controlled,  Endo/Other  Negative Endocrine ROSDiabetes mellitus-, Well Controlled, Type 2, Oral Hypoglycemic AgentsPatient states he took oral diabetic agent this AM  Renal/GU negative Renal ROS   BPH    Musculoskeletal   Abdominal (+) obese,   Peds  Hematology negative hematology ROS (+)   Anesthesia Other Findings   Reproductive/Obstetrics negative OB ROS                        Anesthesia Physical Anesthesia Plan  ASA: III  Anesthesia Plan: General   Post-op Pain Management:    Induction: Intravenous  Airway Management Planned: Oral ETT  Additional Equipment:   Intra-op Plan:   Post-operative Plan: Extubation in OR  Informed Consent: I have reviewed the patients History and Physical, chart, labs  and discussed the procedure including the risks, benefits and alternatives for the proposed anesthesia with the patient or authorized representative who has indicated his/her understanding and acceptance.   Dental Advisory Given  Plan Discussed with: CRNA and Surgeon  Anesthesia Plan Comments:        Anesthesia Quick Evaluation

## 2011-05-23 NOTE — H&P (Signed)
Seth Washington is an 60 y.o. male.   Chief Complaint: rt shoulder pain HPI:60 yoa male with rt shoulder pain.  Past Medical History  Diagnosis Date  . Mental disorder     PTSD  . Hypertension   . Sleep apnea     uses cpap nightly  . Coronary artery disease     stents x4-last intervention 2009  . Hyperlipidemia   . BPH (benign prostatic hyperplasia)   . Myocardial infarction     stent placed-2009 adn 2006 hx of mi x 3   . Diabetes mellitus     type 2 on pills   . GERD (gastroesophageal reflux disease)   . Arthritis     generalized     Past Surgical History  Procedure Date  . Cardiac catheterization     4/11 ,PTCA w/stent '03,'09  . Cervical discectomy 4/11  . Arthroscopy rt shoulder-2007  . Knee arthroscopy 1970's  . Coronary angioplasty     stents in 2006 and 2009     History reviewed. No pertinent family history. Social History:  reports that he quit smoking about 3 years ago. He has never used smokeless tobacco. He reports that he drinks alcohol. He reports that he does not use illicit drugs.  Allergies:  Allergies  Allergen Reactions  . Metformin And Related Diarrhea    Severe diarrhea  . Niacin And Related     Face flushed    Medications Prior to Admission  Medication Dose Route Frequency Provider Last Rate Last Dose  . 0.9 %  sodium chloride infusion   Intravenous Continuous [name], PA      . ceFAZolin (ANCEF) IVPB 2 g/50 mL premix  2 g Intravenous Once [name]      . fentaNYL (SUBLIMAZE) injection 50-100 mcg  50-100 mcg Intravenous PRN [name], MD   50 mcg at 05/23/11 1600  . lactated ringers infusion   Intravenous Continuous [name], MD 50 mL/hr at 05/23/11 1512 1,000 mL at 05/23/11 1512  . midazolam (VERSED) injection 1-2 mg  1-2 mg Intravenous PRN [name], MD   2 mg at 05/23/11 1600  . povidone-iodine (BETADINE) 7.5 % scrub   Topical Once [name], [name]      . DISCONTD: ceFAZolin (ANCEF) IVPB 1  g/50 mL premix  1 g Intravenous 60 min Pre-Op [name], PA      . DISCONTD: midazolam (VERSED) 10 MG/2ML injection 1-2 mg  1-2 mg Intravenous PRN [name], MD       Medications Prior to Admission  Medication Sig Dispense Refill  . amLODipine (NORVASC) 10 MG tablet Take 10 mg by mouth at bedtime.       Marland Kitchen aspirin 81 MG tablet Take 81 mg by mouth at bedtime. Instructed to stay on per [name],and [name]      . ezetimibe (ZETIA) 10 MG tablet Take 10 mg by mouth at bedtime.       . metoprolol (LOPRESSOR) 50 MG tablet Take 50 mg by mouth at bedtime.       Marland Kitchen omeprazole (PRILOSEC) 20 MG capsule Take 20 mg by mouth daily.       . rosuvastatin (CRESTOR) 20 MG tablet Take 20 mg by mouth at bedtime.       . Tamsulosin HCl (FLOMAX) 0.4 MG CAPS Take 0.4 mg by mouth at bedtime.       . clopidogrel (PLAVIX) 75 MG tablet Take 75  mg by mouth at bedtime.         Results for orders placed during the hospital encounter of 05/23/11 (from the past 48 hour(s))  GLUCOSE, CAPILLARY     Status: Abnormal   Collection Time   05/23/11  1:28 PM      Component Value Range Comment   Glucose-Capillary 112 (*) 70 - 99 (mg/dL)    Comment 1 Notify RN      No results found.  Review of Systems  Constitutional: Negative.   HENT: Negative.   Respiratory: Negative.   Cardiovascular: Negative.   Gastrointestinal: Negative.   Genitourinary: Negative.   Musculoskeletal: Positive for joint pain.  Skin: Negative.     Blood pressure 133/84, pulse 51, temperature 98.4 F (36.9 C), temperature source Oral, resp. rate 16, SpO2 94.00%. Physical Exam cons alert oriented. Well developed, lungs clear, heart reg, abd soft.  Assessment/Planrt shoulder scope with rotator cuff repair.  [name] 05/23/2011, 4:41 PM

## 2011-05-23 NOTE — Anesthesia Procedure Notes (Addendum)
Anesthesia Regional Block:  Interscalene brachial plexus block  Pre-Anesthetic Checklist: ,, timeout performed, Correct Patient, Correct Site, Correct Laterality, Correct Procedure, Correct Position, site marked, Risks and benefits discussed,  Surgical consent,  Pre-op evaluation,  At surgeon's request and post-op pain management  Laterality: Right  Prep: chloraprep       Needles:  Injection technique: Single-shot  Needle Type: Stimiplex     Needle Length: 10cm 10 cm Needle Gauge: 20 and 20 G    Additional Needles:  Procedures: ultrasound guided Interscalene brachial plexus block Narrative:  Start time: 05/23/2011 3:45 PM End time: 05/23/2011 3:58 PM Injection made incrementally with aspirations every 5 mL.  Performed by: Personally  Anesthesiologist: A Alastair Hennes MD  Additional Notes: Risks, benefits and alternative to block explained extensively.  Patient tolerated procedure well, without complications.  Interscalene brachial plexus block

## 2011-05-23 NOTE — Op Note (Signed)
[id number] number

## 2011-05-23 NOTE — Anesthesia Postprocedure Evaluation (Signed)
Anesthesia Post Note  Patient: Seth Washington  Procedure(s) Performed:  SHOULDER ARTHROSCOPY WITH SUBACROMIAL DECOMPRESSION, ROTATOR CUFF REPAIR AND BICEP TENDON REPAIR - Right Shoulder Arthroscopy/Subacromial Decompression/Labial Debridement/Biceps Tendotomy/Rotator Cuff Repair  Anesthesia type: General  Patient location: PACU  Post pain: Pain level controlled  Post assessment: Post-op Vital signs reviewed  Last Vitals:  Filed Vitals:   05/23/11 1844  BP: 163/89  Pulse: 82  Temp: 36.1 C  Resp: 17    Post vital signs: Reviewed  Level of consciousness: sedated  Complications: No apparent anesthesia complications

## 2011-05-23 NOTE — H&P (Signed)
Seth Washington is an 60 y.o. male.   Chief Complaint: rt shoulder pain HPI:60 yoa male with rt shoulder pain.  Past Medical History  Diagnosis Date  . Mental disorder     PTSD  . Hypertension   . Sleep apnea     uses cpap nightly  . Coronary artery disease     stents x4-last intervention 2009  . Hyperlipidemia   . BPH (benign prostatic hyperplasia)   . Myocardial infarction     stent placed-2009 adn 2006 hx of mi x 3   . Diabetes mellitus     type 2 on pills   . GERD (gastroesophageal reflux disease)   . Arthritis     generalized     Past Surgical History  Procedure Date  . Cardiac catheterization     4/11 ,PTCA w/stent '03,'09  . Cervical discectomy 4/11  . Arthroscopy rt shoulder-2007  . Knee arthroscopy 1970's  . Coronary angioplasty     stents in 2006 and 2009     History reviewed. No pertinent family history. Social History:  reports that he quit smoking about 3 years ago. He has never used smokeless tobacco. He reports that he drinks alcohol. He reports that he does not use illicit drugs.  Allergies:  Allergies  Allergen Reactions  . Metformin And Related Diarrhea    Severe diarrhea  . Niacin And Related     Face flushed    Medications Prior to Admission  Medication Dose Route Frequency Provider Last Rate Last Dose  . 0.9 %  sodium chloride infusion   Intravenous Continuous [name], PA      . ceFAZolin (ANCEF) IVPB 2 g/50 mL premix  2 g Intravenous Once [name]      . fentaNYL (SUBLIMAZE) injection 50-100 mcg  50-100 mcg Intravenous PRN [name], MD   50 mcg at 05/23/11 1600  . lactated ringers infusion   Intravenous Continuous [name], MD 50 mL/hr at 05/23/11 1512 1,000 mL at 05/23/11 1512  . midazolam (VERSED) injection 1-2 mg  1-2 mg Intravenous PRN [name], MD   2 mg at 05/23/11 1600  . povidone-iodine (BETADINE) 7.5 % scrub   Topical Once [name], [name]      . DISCONTD: ceFAZolin (ANCEF) IVPB 1  g/50 mL premix  1 g Intravenous 60 min Pre-Op [name], PA      . DISCONTD: midazolam (VERSED) 10 MG/2ML injection 1-2 mg  1-2 mg Intravenous PRN [name], MD       Medications Prior to Admission  Medication Sig Dispense Refill  . amLODipine (NORVASC) 10 MG tablet Take 10 mg by mouth at bedtime.       Marland Kitchen aspirin 81 MG tablet Take 81 mg by mouth at bedtime. Instructed to stay on per [name],and [name]      . ezetimibe (ZETIA) 10 MG tablet Take 10 mg by mouth at bedtime.       . metoprolol (LOPRESSOR) 50 MG tablet Take 50 mg by mouth at bedtime.       Marland Kitchen omeprazole (PRILOSEC) 20 MG capsule Take 20 mg by mouth daily.       . rosuvastatin (CRESTOR) 20 MG tablet Take 20 mg by mouth at bedtime.       . Tamsulosin HCl (FLOMAX) 0.4 MG CAPS Take 0.4 mg by mouth at bedtime.       . clopidogrel (PLAVIX) 75 MG tablet Take 75  mg by mouth at bedtime.         Results for orders placed during the hospital encounter of 05/23/11 (from the past 48 hour(s))  GLUCOSE, CAPILLARY     Status: Abnormal   Collection Time   05/23/11  1:28 PM      Component Value Range Comment   Glucose-Capillary 112 (*) 70 - 99 (mg/dL)    Comment 1 Notify RN      No results found.  Review of Systems  Constitutional: Negative.   HENT: Negative.   Respiratory: Negative.   Cardiovascular: Negative.   Gastrointestinal: Negative.   Genitourinary: Negative.   Musculoskeletal: Positive for joint pain.  Skin: Negative.     Blood pressure 133/84, pulse 51, temperature 98.4 F (36.9 C), temperature source Oral, resp. rate 16, SpO2 94.00%. Physical Exam  cons alert oriented. Well developed, lungs clear, heart reg, abd soft.  Assessment/Planrt shoulder scope with rotator cuff repair.  Seth Washington Seth Washington 05/23/2011, 4:51 PM   I have seen and examined this patient.  Agree with the note above.  Seth Washington Seth Washington 05/23/2011 4:52 PM

## 2011-05-24 MED ORDER — OXYCODONE HCL 5 MG PO TABS: 5.0000 mg | ORAL_TABLET | ORAL | Status: AC | PRN

## 2011-05-24 MED ORDER — METHOCARBAMOL 500 MG PO TABS: 500.0000 mg | ORAL_TABLET | Freq: Four times a day (QID) | ORAL | Status: AC | PRN

## 2011-05-24 NOTE — Discharge Summary (Signed)
Physician Discharge Summary  Patient ID: Seth Washington MRN: [id number] DOB/AGE: April 27, 1951 60 y.o.  Admit date: 05/23/2011 Discharge date: 05/24/2011  Admission Diagnoses:Torn rt shoulder rotator cuff  Discharge Diagnoses:S/P rt Shoulder scope with Irrepairable rotator cuff  Active Problems:  * No active hospital problems. *    Discharged Condition: good  Hospital Course: good  Consults: none  Significant Diagnostic Studies: none  Treatments: surgery: Rt shoulder scope  Discharge Exam: Blood pressure 125/80, pulse 55, temperature 98 F (36.7 C), temperature source Oral, resp. rate 18, height 5\' 10"  (1.778 m), weight 100.699 kg (222 lb), SpO2 97.00%. Rt arm in sling, dressing intact, Residual tingling in hand from block, atherwise normal exam  Disposition: Home or Self Care  Discharge Orders    Future Orders Please Complete By Expires   Diet Carb Modified      Call MD / Call 911      Comments:   If you experience chest pain or shortness of breath, CALL 911 and be transported to the hospital emergency room.  If you develope a fever above 101 F, pus (white drainage) or increased drainage or redness at the wound, or calf pain, call your surgeon's office.   Increase activity slowly as tolerated      Discharge instructions      Comments:   Remove dressing Friday and apply band aids. Call [phone] for follow up  Appointment.   Driving restrictions      Comments:   No driving for 2 weeks     Medication List  As of 05/24/2011  7:42 AM   START taking these medications         methocarbamol 500 MG tablet   Commonly known as: ROBAXIN   Take 1 tablet (500 mg total) by mouth every 6 (six) hours as needed.      oxyCODONE 5 MG immediate release tablet   Commonly known as: Oxy IR/ROXICODONE   Take 1-2 tablets (5-10 mg total) by mouth every 4 (four) hours as needed.         CONTINUE taking these medications         7-KETO LEAN PO      amLODipine 10 MG tablet   Commonly  known as: NORVASC      aspirin 81 MG tablet      clopidogrel 75 MG tablet   Commonly known as: PLAVIX      enalapril 10 MG tablet   Commonly known as: VASOTEC      ezetimibe 10 MG tablet   Commonly known as: ZETIA      metoprolol 50 MG tablet   Commonly known as: LOPRESSOR      nitroGLYCERIN 0.4 MG SL tablet   Commonly known as: NITROSTAT      omeprazole 20 MG capsule   Commonly known as: PRILOSEC      ONGLYZA 5 MG Tabs tablet   Generic drug: saxagliptin HCl      OVER THE COUNTER MEDICATION      OVER THE COUNTER MEDICATION      QUEtiapine 25 MG tablet   Commonly known as: SEROQUEL      rosuvastatin 20 MG tablet   Commonly known as: CRESTOR      sertraline 50 MG tablet   Commonly known as: ZOLOFT      Tamsulosin HCl 0.4 MG Caps   Commonly known as: FLOMAX      traZODone 150 MG tablet   Commonly known as: DESYREL  Where to get your medications    These are the prescriptions that you need to pick up.   You may get these medications from any pharmacy.         methocarbamol 500 MG tablet   oxyCODONE 5 MG immediate release tablet             Signed: [name] 05/24/2011, 7:42 AM

## 2011-05-24 NOTE — Op Note (Signed)
I have seen and examined this patient.  Agree with the note above.  Kimori Tartaglia ANDREW 05/26/2011 3:45 PM  NAME:  RANA, ADORNO               ACCOUNT NO.:  [id number]  MEDICAL RECORD NO.:  [id number]  LOCATION:  1404                         FACILITY:  [facility]  PHYSICIAN:  [name], M.D.DATE OF BIRTH:  Aug 22, 1950  DATE OF PROCEDURE:  05/23/2011 DATE OF DISCHARGE:                              OPERATIVE REPORT   PREOPERATIVE DIAGNOSIS:  Right shoulder subluxed biceps tendon with partial tearing, rotator cuff tears, acromioclavicular arthritis.  POSTOPERATIVE DIAGNOSES: 1. Right shoulder medial subluxation of biceps tendon with marked     rotator cuff bulge of biceps tendon and biceps tendinopathy. 2. Ulnar glenohumeral osteoarthritis. 3. Irreparable rotator cuff tears. 4. Acromioclavicular arthritis.  PROCEDURES: 1. Right shoulder glenohumeral arthroscopy with intra-articular biceps     tenotomy and labral debridement. 2. Subacromial debridement. 3. Arthroscopic distal clavicle resection, Mumford Procedure.  SURGEON:  [name], M.D.  ASSISTANT:  [name], P.A.C.  ANESTHESIA:  Interscalene block and general.  ESTIMATED BLOOD LOSS:  Less than 10 cc.  DRAINS:  None.  COMPLICATIONS:  None.  DISPOSITION:  PACU, stable.  OPERATIVE DETAILS:  The patient was counseled in the holding area, the correct side was identified, marked, and signed appropriately.  Chart reviewed and signed appropriately.  IV had been started.  A block had been administered.  On the way to the operating room, IV Ancef was given.  In the OR, he was placedin the supine  position under general anesthesia.  Turned to the left lateral decubitus position.  Right shoulder was examined, found to be in full range of motion, functional, and stable.  He was properly padded and bumped.  PAS stockings were applied for DVT prophylaxis.  He was prepped with DuraPrep and draped in a  sterile fashion.  The overhead shoulder position was utilized with 30 degrees of abduction then increased to full flexion and 15-pound longitudinal traction.  Posterior portal was created and arthroscope placed in the glenohumeral joint.  Immediately found was a  medially subluxed biceps tendon, right rotator biceps tendinopathy, and partial tearing.  Lateral portal was established and biceps tenotomy was performed and the superior labral tissue was debrided by healthy tissue with shaver and cautery system.  At this point in time, I then tried to mobilize the rotator cuff involved the entire supraspinatus rotator cuff interval and infraspinatus; however, they could not be mobilized back to the edge of the glenoid at all even with traction.  In addition, the infraspinatuswas down to  approximately 3 mm of tendon and the remainder was simply muscle.  At this point in time, it was decided that this is an irreparable rotator cuff tear.  It was debrided back to healthy tissue.  Also noted he had  rotator cuff tendinopathy of the remaining tissue.  __________ it was left alone to make sure did not destabilize it.  Scar tissue was resected.  I found the distal clavicle was found to have __________ anterior portal was made and distal clavicle was resected approximately 5-8 mm, then the capsule was intact.  Clavicle was palpated, found to be stable,  debrided and removed.  Hemostasis was obtained.  There was no other abnormalities noted here.  Irrigated and arthroscopic equipment was removed, taken out of traction.  He had normal pulses in the wrist of the entire case.  Portal was closed __________ suture.  Another 10 cc of 0.25% Sensorcaine placed at the portal sites of subacromial region.  Sterile dressing was applied to the shoulder.  Turned supine, placed into a sling, awakened, and taken out of the operating room to PACU in stable condition.  To help with surgical patient positioning,  prepping, draping, technical and surgical assistance throughout the entire Case, wound closure, application of dressing and sling, Mr. Arlyn Leak, PA assistance was needed.          ______________________________ [name], M.D.     RAC/MEDQ  D:  05/23/2011  T:  05/24/2011  Job:  [id number]

## 2011-05-24 NOTE — Progress Notes (Signed)
Subjective:no complaints   Objective: Vital signs in last 24 hours: Temp:  [97 F (36.1 C)-98.4 F (36.9 C)] 98 F (36.7 C) (12/19 0520) Pulse Rate:  [51-82] 55  (12/19 0520) Resp:  [12-22] 18  (12/19 0520) BP: (124-163)/(71-89) 125/80 mmHg (12/19 0520) SpO2:  [92 %-100 %] 97 % (12/19 0520) Weight:  [100.699 kg (222 lb)] 222 lb (100.699 kg) (12/18 2001)  Intake/Output from previous day: 12/18 0701 - 12/19 0700 In: 2450 [I.V.:2350; IV Piggyback:100] Out: 300 [Urine:300] Intake/Output this shift:    No results found for this basename: HGB:5 in the last 72 hours No results found for this basename: WBC:2,RBC:2,HCT:2,PLT:2 in the last 72 hours No results found for this basename: NA:2,K:2,CL:2,CO2:2,BUN:2,CREATININE:2,GLUCOSE:2,CALCIUM:2 in the last 72 hours No results found for this basename: LABPT:2,INR:2 in the last 72 hours  No distress, rt shoulder dressing clean and dry. rt hand still has some tingling  Assessment/Plan: POD 1 RT shoulder scope doing well. Will d/c home with 1 week f/u   Asiyah Pineau W 05/24/2011, 7:32 AM

## 2011-05-24 NOTE — Progress Notes (Signed)
Occupational Therapy Note Pt up and already dressed this am. He states he has had a previous shoulder surgery and doesn't feel he needs a review of ADL or sling education. He has assist at home. Will defer OT eval per pt request. Judithann Sauger OTR/L [phone] 05/24/2011

## 2011-06-08 DIAGNOSIS — S43429A Sprain of unspecified rotator cuff capsule, initial encounter: Secondary | ICD-10-CM | POA: Diagnosis not present

## 2011-06-16 DIAGNOSIS — S43429A Sprain of unspecified rotator cuff capsule, initial encounter: Secondary | ICD-10-CM | POA: Diagnosis not present

## 2011-06-19 DIAGNOSIS — S43429A Sprain of unspecified rotator cuff capsule, initial encounter: Secondary | ICD-10-CM | POA: Diagnosis not present

## 2011-06-22 DIAGNOSIS — S43429A Sprain of unspecified rotator cuff capsule, initial encounter: Secondary | ICD-10-CM | POA: Diagnosis not present

## 2011-06-26 DIAGNOSIS — S43429A Sprain of unspecified rotator cuff capsule, initial encounter: Secondary | ICD-10-CM | POA: Diagnosis not present

## 2011-07-03 DIAGNOSIS — S43429A Sprain of unspecified rotator cuff capsule, initial encounter: Secondary | ICD-10-CM | POA: Diagnosis not present

## 2011-07-06 DIAGNOSIS — S43429A Sprain of unspecified rotator cuff capsule, initial encounter: Secondary | ICD-10-CM | POA: Diagnosis not present

## 2011-07-11 DIAGNOSIS — S43429A Sprain of unspecified rotator cuff capsule, initial encounter: Secondary | ICD-10-CM | POA: Diagnosis not present

## 2011-07-13 DIAGNOSIS — S43429A Sprain of unspecified rotator cuff capsule, initial encounter: Secondary | ICD-10-CM | POA: Diagnosis not present

## 2011-07-18 DIAGNOSIS — S43429A Sprain of unspecified rotator cuff capsule, initial encounter: Secondary | ICD-10-CM | POA: Diagnosis not present

## 2011-07-21 DIAGNOSIS — E119 Type 2 diabetes mellitus without complications: Secondary | ICD-10-CM | POA: Diagnosis not present

## 2011-07-21 DIAGNOSIS — I10 Essential (primary) hypertension: Secondary | ICD-10-CM | POA: Diagnosis not present

## 2011-07-21 DIAGNOSIS — E7800 Pure hypercholesterolemia, unspecified: Secondary | ICD-10-CM | POA: Diagnosis not present

## 2011-07-21 DIAGNOSIS — S43429A Sprain of unspecified rotator cuff capsule, initial encounter: Secondary | ICD-10-CM | POA: Diagnosis not present

## 2011-07-26 DIAGNOSIS — D485 Neoplasm of uncertain behavior of skin: Secondary | ICD-10-CM | POA: Diagnosis not present

## 2011-08-09 DIAGNOSIS — L738 Other specified follicular disorders: Secondary | ICD-10-CM | POA: Diagnosis not present

## 2011-09-13 DIAGNOSIS — S43429A Sprain of unspecified rotator cuff capsule, initial encounter: Secondary | ICD-10-CM | POA: Diagnosis not present

## 2011-09-22 NOTE — Progress Notes (Signed)
I have seen and examined this patient.  Agree with the note above.  Seth Washington 09/22/2011 6:10 PM

## 2011-11-07 DIAGNOSIS — M25569 Pain in unspecified knee: Secondary | ICD-10-CM | POA: Diagnosis not present

## 2011-11-08 DIAGNOSIS — E7800 Pure hypercholesterolemia, unspecified: Secondary | ICD-10-CM | POA: Diagnosis not present

## 2011-11-08 DIAGNOSIS — G4733 Obstructive sleep apnea (adult) (pediatric): Secondary | ICD-10-CM | POA: Diagnosis not present

## 2011-11-08 DIAGNOSIS — I10 Essential (primary) hypertension: Secondary | ICD-10-CM | POA: Diagnosis not present

## 2011-11-08 DIAGNOSIS — I2510 Atherosclerotic heart disease of native coronary artery without angina pectoris: Secondary | ICD-10-CM | POA: Diagnosis not present

## 2012-01-24 DIAGNOSIS — I2510 Atherosclerotic heart disease of native coronary artery without angina pectoris: Secondary | ICD-10-CM | POA: Diagnosis not present

## 2012-01-24 DIAGNOSIS — E1169 Type 2 diabetes mellitus with other specified complication: Secondary | ICD-10-CM | POA: Diagnosis not present

## 2012-01-24 DIAGNOSIS — E7800 Pure hypercholesterolemia, unspecified: Secondary | ICD-10-CM | POA: Diagnosis not present

## 2012-01-24 DIAGNOSIS — F411 Generalized anxiety disorder: Secondary | ICD-10-CM | POA: Diagnosis not present

## 2012-01-24 DIAGNOSIS — G479 Sleep disorder, unspecified: Secondary | ICD-10-CM | POA: Diagnosis not present

## 2012-01-24 DIAGNOSIS — G4733 Obstructive sleep apnea (adult) (pediatric): Secondary | ICD-10-CM | POA: Diagnosis not present

## 2012-01-24 DIAGNOSIS — I10 Essential (primary) hypertension: Secondary | ICD-10-CM | POA: Diagnosis not present

## 2012-02-10 DIAGNOSIS — M25569 Pain in unspecified knee: Secondary | ICD-10-CM | POA: Diagnosis not present

## 2012-02-19 ENCOUNTER — Ambulatory Visit (INDEPENDENT_AMBULATORY_CARE_PROVIDER_SITE_OTHER): Payer: Medicare Other

## 2012-02-19 ENCOUNTER — Other Ambulatory Visit (HOSPITAL_BASED_OUTPATIENT_CLINIC_OR_DEPARTMENT_OTHER): Payer: Self-pay | Admitting: Orthopedic Surgery

## 2012-02-19 DIAGNOSIS — M1710 Unilateral primary osteoarthritis, unspecified knee: Secondary | ICD-10-CM | POA: Diagnosis not present

## 2012-02-19 DIAGNOSIS — Z9889 Other specified postprocedural states: Secondary | ICD-10-CM

## 2012-02-19 DIAGNOSIS — M5030 Other cervical disc degeneration, unspecified cervical region: Secondary | ICD-10-CM | POA: Diagnosis not present

## 2012-02-19 DIAGNOSIS — Z981 Arthrodesis status: Secondary | ICD-10-CM

## 2012-02-20 DIAGNOSIS — M1710 Unilateral primary osteoarthritis, unspecified knee: Secondary | ICD-10-CM | POA: Diagnosis not present

## 2012-03-06 DIAGNOSIS — M1710 Unilateral primary osteoarthritis, unspecified knee: Secondary | ICD-10-CM | POA: Diagnosis not present

## 2012-03-13 DIAGNOSIS — S43429A Sprain of unspecified rotator cuff capsule, initial encounter: Secondary | ICD-10-CM | POA: Diagnosis not present

## 2012-03-15 DIAGNOSIS — M1710 Unilateral primary osteoarthritis, unspecified knee: Secondary | ICD-10-CM | POA: Diagnosis not present

## 2012-03-20 DIAGNOSIS — M25519 Pain in unspecified shoulder: Secondary | ICD-10-CM | POA: Diagnosis not present

## 2012-04-03 DIAGNOSIS — I10 Essential (primary) hypertension: Secondary | ICD-10-CM | POA: Diagnosis not present

## 2012-04-03 DIAGNOSIS — E1169 Type 2 diabetes mellitus with other specified complication: Secondary | ICD-10-CM | POA: Diagnosis not present

## 2012-04-03 DIAGNOSIS — Z01810 Encounter for preprocedural cardiovascular examination: Secondary | ICD-10-CM | POA: Diagnosis not present

## 2012-04-03 DIAGNOSIS — I2510 Atherosclerotic heart disease of native coronary artery without angina pectoris: Secondary | ICD-10-CM | POA: Diagnosis not present

## 2012-04-17 DIAGNOSIS — Z808 Family history of malignant neoplasm of other organs or systems: Secondary | ICD-10-CM | POA: Diagnosis not present

## 2012-04-17 DIAGNOSIS — L821 Other seborrheic keratosis: Secondary | ICD-10-CM | POA: Diagnosis not present

## 2012-04-17 DIAGNOSIS — L820 Inflamed seborrheic keratosis: Secondary | ICD-10-CM | POA: Diagnosis not present

## 2012-04-17 DIAGNOSIS — L570 Actinic keratosis: Secondary | ICD-10-CM | POA: Diagnosis not present

## 2012-04-17 DIAGNOSIS — D239 Other benign neoplasm of skin, unspecified: Secondary | ICD-10-CM | POA: Diagnosis not present

## 2012-05-28 ENCOUNTER — Encounter (HOSPITAL_COMMUNITY): Payer: Self-pay | Admitting: Pharmacy Technician

## 2012-06-03 ENCOUNTER — Encounter (HOSPITAL_COMMUNITY): Payer: Self-pay

## 2012-06-03 ENCOUNTER — Encounter (HOSPITAL_COMMUNITY)
Admission: RE | Admit: 2012-06-03 | Discharge: 2012-06-03 | Disposition: A | Discharge status: Home / Self Care | Payer: Medicare Other | Source: Ambulatory Visit | Attending: Orthopedic Surgery | Admitting: Orthopedic Surgery

## 2012-06-03 ENCOUNTER — Ambulatory Visit (HOSPITAL_COMMUNITY)
Admission: RE | Admit: 2012-06-03 | Discharge: 2012-06-03 | Disposition: A | Discharge status: Home / Self Care | Payer: Medicare Other | Source: Ambulatory Visit | Attending: Orthopedic Surgery | Admitting: Orthopedic Surgery

## 2012-06-03 DIAGNOSIS — E7800 Pure hypercholesterolemia, unspecified: Secondary | ICD-10-CM | POA: Diagnosis not present

## 2012-06-03 DIAGNOSIS — I10 Essential (primary) hypertension: Secondary | ICD-10-CM | POA: Diagnosis not present

## 2012-06-03 DIAGNOSIS — Z01818 Encounter for other preprocedural examination: Secondary | ICD-10-CM | POA: Diagnosis not present

## 2012-06-03 DIAGNOSIS — G4733 Obstructive sleep apnea (adult) (pediatric): Secondary | ICD-10-CM | POA: Diagnosis not present

## 2012-06-03 DIAGNOSIS — I2510 Atherosclerotic heart disease of native coronary artery without angina pectoris: Secondary | ICD-10-CM | POA: Diagnosis not present

## 2012-06-03 DIAGNOSIS — Z01811 Encounter for preprocedural respiratory examination: Secondary | ICD-10-CM | POA: Insufficient documentation

## 2012-06-03 LAB — CBC
Platelets: 195 10*3/uL (ref 150–400)
RBC: 4.96 MIL/uL (ref 4.22–5.81)
WBC: 7.8 10*3/uL (ref 4.0–10.5)

## 2012-06-03 LAB — URINALYSIS, ROUTINE W REFLEX MICROSCOPIC
Bilirubin Urine: NEGATIVE
Hgb urine dipstick: NEGATIVE
Protein, ur: NEGATIVE mg/dL
Urobilinogen, UA: 1 mg/dL (ref 0.0–1.0)

## 2012-06-03 LAB — SURGICAL PCR SCREEN: Staphylococcus aureus: NEGATIVE

## 2012-06-03 LAB — BASIC METABOLIC PANEL
Calcium: 8.8 mg/dL (ref 8.4–10.5)
GFR calc non Af Amer: 90 mL/min — ABNORMAL LOW (ref >90)
Sodium: 136 mEq/L (ref 135–145)

## 2012-06-03 LAB — PROTIME-INR
INR: 1.01 (ref 0.00–1.49)
Prothrombin Time: 13.2 seconds (ref 11.6–15.2)

## 2012-06-03 LAB — APTT: aPTT: 31 seconds (ref 24–37)

## 2012-06-03 NOTE — Patient Instructions (Signed)
YOUR SURGERY IS SCHEDULED AT [facility]  ON:  Monday  1/6  REPORT TO [place] AT:  7:30 AM      PHONE # FOR SHORT STAY IS [phone]  DO NOT EAT OR DRINK ANYTHING AFTER MIDNIGHT THE NIGHT BEFORE YOUR SURGERY.  YOU MAY BRUSH YOUR TEETH, RINSE OUT YOUR MOUTH--BUT NO WATER, NO FOOD, NO CHEWING GUM, NO MINTS, NO CANDIES, NO CHEWING TOBACCO.  PLEASE TAKE THE FOLLOWING MEDICATIONS THE AM OF YOUR SURGERY WITH A FEW SIPS OF WATER:  RANITIDINE  IF YOU USE INHALERS--USE YOUR INHALERS THE AM OF YOUR SURGERY AND BRING INHALERS TO THE HOSPITAL -TAKE TO SURGERY.    IF YOU ARE DIABETIC:  DO NOT TAKE ANY DIABETIC MEDICATIONS THE AM OF YOUR SURGERY.  IF YOU TAKE INSULIN IN THE EVENINGS--PLEASE ONLY TAKE 1/2 NORMAL EVENING DOSE THE NIGHT BEFORE YOUR SURGERY.  NO INSULIN THE AM OF YOUR SURGERY.  IF YOU HAVE SLEEP APNEA AND USE CPAP OR BIPAP--PLEASE BRING THE MASK AND THE TUBING.  DO NOT BRING YOUR MACHINE.  DO NOT BRING VALUABLES, MONEY, CREDIT CARDS.  DO NOT WEAR JEWELRY, MAKE-UP, NAIL POLISH AND NO METAL PINS OR CLIPS IN YOUR HAIR. CONTACT LENS, DENTURES / PARTIALS, GLASSES SHOULD NOT BE WORN TO SURGERY AND IN MOST CASES-HEARING AIDS WILL NEED TO BE REMOVED.  BRING YOUR GLASSES CASE, ANY EQUIPMENT NEEDED FOR YOUR CONTACT LENS. FOR PATIENTS ADMITTED TO THE HOSPITAL--CHECK OUT TIME THE DAY OF DISCHARGE IS 11:00 AM.  ALL INPATIENT ROOMS ARE PRIVATE - WITH BATHROOM, TELEPHONE, TELEVISION AND WIFI INTERNET.  IF YOU ARE BEING DISCHARGED THE SAME DAY OF YOUR SURGERY--YOU CAN NOT DRIVE YOURSELF HOME--AND SHOULD NOT GO HOME ALONE BY TAXI OR BUS.  NO DRIVING OR OPERATING MACHINERY FOR 24 HOURS FOLLOWING ANESTHESIA / PAIN MEDICATIONS.  PLEASE MAKE ARRANGEMENTS FOR SOMEONE TO BE WITH YOU AT HOME THE FIRST 24 HOURS AFTER SURGERY. RESPONSIBLE DRIVER'S NAME___________________________                                               PHONE #   _______________________                                 PLEASE READ OVER ANY  FACT SHEETS THAT YOU WERE GIVEN: MRSA INFORMATION, BLOOD TRANSFUSION INFORMATION, INCENTIVE SPIROMETER INFORMATION. FAILURE TO FOLLOW THESE INSTRUCTIONS MAY RESULT IN THE CANCELLATION OF YOUR SURGERY.   PATIENT SIGNATURE_________________________________

## 2012-06-03 NOTE — Pre-Procedure Instructions (Signed)
PREOP CBC, BMET, PT, PTT, UA, T/S CXR WERE DONE TODAY AT [facility] AS PER ORDERS DR. OLIN AND ANESTHESIOLOGIST'S GUIDELINES.  NUCLEAR STRESS TEST RESULTS WITH RESTING EKG ON CHART - DONE 04/03/12 - FROM [facility].  PT HAS CARDIOLOGY OFFICE NOTE FROM DR. T. TURNER 11/08/11, HAS CARDIAC CLEARANCE FOR RT KNEE SURGERY AND ECHO REPORT 04/07/10.

## 2012-06-06 DIAGNOSIS — M1710 Unilateral primary osteoarthritis, unspecified knee: Secondary | ICD-10-CM | POA: Diagnosis not present

## 2012-06-07 NOTE — H&P (Signed)
TOTAL KNEE ADMISSION H&P  Patient is being admitted for right knee arthroscopy with partial lateral menisectomy and right medial unicompartmental knee arthroplasty.  Subjective:  Chief Complaint: right knee OA / pain.  HPI: Seth Washington, 62 y.o. male, has a history of pain and functional disability in the right knee due to arthritis and has failed non-surgical conservative treatments for greater than 12 weeks to includeNSAID's and/or analgesics, corticosteriod injections, weight reduction as appropriate and activity modification.  Onset of symptoms was gradual, starting 3 years ago with gradually worsening course since that time. The patient noted no past surgery on the right knee(s).  Patient currently rates pain in the right knee(s) at 7 out of 10 with activity. Patient has worsening of pain with activity and weight bearing, pain that interferes with activities of daily living, pain with passive range of motion, crepitus and joint swelling.  Patient has evidence of periarticular osteophytes and joint space narrowing by imaging studies. A MRI of the right knee revealed a lateral mensicus tear.  There is no active infection.  Risks, benefits and expectations were discussed with the patient. Patient understand the risks, benefits and expectations and wishes to proceed with surgery.   D/C Plans:  Home with HHPT  Post-op Meds:  No Rx given   Tranexamic Acid:   Not to be given  Decadron:   Not to be given  FYI:  Will go back on Plavix and ASA after surgery  Past Medical History  Diagnosis Date  . Mental disorder     PTSD  . Hypertension   . Sleep apnea     uses cpap nightly  . Coronary artery disease     stents x4-last intervention 2009  . Hyperlipidemia   . BPH (benign prostatic hyperplasia)   . Myocardial infarction     stent placed-2009 adn 2006 hx of mi x 3   . Diabetes mellitus     type 2 on pills   . GERD (gastroesophageal reflux disease)   . Arthritis     generalized   .  Complication of anesthesia     HEART ATTACK IN THE RECOVERY ROOM AFTER NECK SURGERY    Past Surgical History  Procedure Date  . Cardiac catheterization     4/11 ,PTCA w/stent '03,'09  . Cervical discectomy 4/11  . Arthroscopy rt shoulder-2007  . Knee arthroscopy 1970's  . Coronary angioplasty     stents in 2006 and 2009   . Right shoulder subacromial decompression  05/23/11     No prescriptions prior to admission   Allergies  Allergen Reactions  . Metformin And Related Diarrhea    Severe diarrhea  . Niacin And Related     Face flushed    History  Substance Use Topics  . Smoking status: Former Smoker    Quit date: 09/13/2007  . Smokeless tobacco: Never Used  . Alcohol Use: Yes     Comment: rare    No family history on file.   Review of Systems  Constitutional: Positive for malaise/fatigue.  HENT: Negative.   Eyes: Negative.   Respiratory: Negative.   Cardiovascular: Negative.   Gastrointestinal: Negative.   Genitourinary: Positive for urgency and frequency.  Musculoskeletal: Positive for myalgias, back pain and joint pain.  Skin: Negative.   Neurological: Negative.   Endo/Heme/Allergies: Negative.   Psychiatric/Behavioral: Negative.     Objective:  Physical Exam  Constitutional: He is oriented to person, place, and time. He appears well-developed and well-nourished.  HENT:  Head:  Normocephalic and atraumatic.  Mouth/Throat: Oropharynx is clear and moist.  Eyes: Pupils are equal, round, and reactive to light.  Neck: Neck supple. No JVD present. No tracheal deviation present. No thyromegaly present.  Cardiovascular: Normal rate, regular rhythm, normal heart sounds and intact distal pulses.   Respiratory: Effort normal and breath sounds normal. No respiratory distress. He has no wheezes.  GI: Soft. There is no tenderness. There is no guarding.  Musculoskeletal:       Right knee: He exhibits decreased range of motion, swelling and bony tenderness. He exhibits  no effusion, no ecchymosis, no laceration and no erythema. tenderness found. Medial joint line tenderness noted. No lateral joint line tenderness noted.  Lymphadenopathy:    He has no cervical adenopathy.  Neurological: He is alert and oriented to person, place, and time.  Skin: Skin is warm and dry.  Psychiatric: He has a normal mood and affect.    Labs:  Estimated Body mass index is 32.70 kg/(m^2) as calculated from the following:   Height as of 05/18/11: 5\' 10" (1.778 m).   Weight as of 05/18/11: 227 lb 14.4 oz(103.375 kg).   Imaging Review Plain radiographs demonstrate severe degenerative joint disease of the medial right knee(s). The overall alignment is neutral. The bone quality appears to be good for age and reported activity level.  A MRI of the right knee revealed a lateral meniscus tear.  Assessment/Plan:  End stage arthritis, right knee   The patient history, physical examination, clinical judgment of the provider and imaging studies are consistent with end stage degenerative joint disease of the right knee(s) and total knee arthroplasty is deemed medically necessary. The treatment options including medical management, injection therapy arthroscopy and arthroplasty were discussed at length. The risks and benefits of total knee arthroplasty were presented and reviewed. The risks due to aseptic loosening, infection, stiffness, patella tracking problems, thromboembolic complications and other imponderables were discussed. The patient acknowledged the explanation, agreed to proceed with the plan and consent was signed. Patient is being admitted for inpatient treatment for surgery, pain control, PT, OT, prophylactic antibiotics, VTE prophylaxis, progressive ambulation and ADL's and discharge planning. The patient is planning to be discharged home with home health services.    Anastasio Auerbach Roshon Duell   PAC  06/07/2012, 3:31 PM

## 2012-06-09 MED ORDER — CEFAZOLIN SODIUM-DEXTROSE 2-3 GM-% IV SOLR
2.0000 g | INTRAVENOUS | Status: AC
  Administered 2012-06-10: 2 g via INTRAVENOUS

## 2012-06-10 ENCOUNTER — Encounter (HOSPITAL_COMMUNITY): Payer: Self-pay | Admitting: *Deleted

## 2012-06-10 ENCOUNTER — Encounter (HOSPITAL_COMMUNITY): Payer: Self-pay | Admitting: Anesthesiology

## 2012-06-10 ENCOUNTER — Ambulatory Visit (HOSPITAL_COMMUNITY): Payer: Medicare Other | Admitting: Anesthesiology

## 2012-06-10 ENCOUNTER — Inpatient Hospital Stay (HOSPITAL_COMMUNITY)
Admission: RE | Admit: 2012-06-10 | Discharge: 2012-06-11 | DRG: 470 | Disposition: A | Discharge status: Home Health | Payer: Medicare Other | Source: Ambulatory Visit | Attending: Orthopedic Surgery | Admitting: Orthopedic Surgery

## 2012-06-10 ENCOUNTER — Encounter (HOSPITAL_COMMUNITY)
Admission: RE | Disposition: A | Discharge status: Home Health | Payer: Self-pay | Source: Ambulatory Visit | Attending: Orthopedic Surgery

## 2012-06-10 DIAGNOSIS — E785 Hyperlipidemia, unspecified: Secondary | ICD-10-CM | POA: Diagnosis present

## 2012-06-10 DIAGNOSIS — I2510 Atherosclerotic heart disease of native coronary artery without angina pectoris: Secondary | ICD-10-CM | POA: Diagnosis present

## 2012-06-10 DIAGNOSIS — I252 Old myocardial infarction: Secondary | ICD-10-CM

## 2012-06-10 DIAGNOSIS — S83289A Other tear of lateral meniscus, current injury, unspecified knee, initial encounter: Secondary | ICD-10-CM | POA: Diagnosis present

## 2012-06-10 DIAGNOSIS — N400 Enlarged prostate without lower urinary tract symptoms: Secondary | ICD-10-CM | POA: Diagnosis present

## 2012-06-10 DIAGNOSIS — Z96651 Presence of right artificial knee joint: Secondary | ICD-10-CM

## 2012-06-10 DIAGNOSIS — Z6833 Body mass index (BMI) 33.0-33.9, adult: Secondary | ICD-10-CM | POA: Diagnosis not present

## 2012-06-10 DIAGNOSIS — M1710 Unilateral primary osteoarthritis, unspecified knee: Secondary | ICD-10-CM | POA: Diagnosis not present

## 2012-06-10 DIAGNOSIS — M23302 Other meniscus derangements, unspecified lateral meniscus, unspecified knee: Secondary | ICD-10-CM | POA: Diagnosis not present

## 2012-06-10 DIAGNOSIS — E871 Hypo-osmolality and hyponatremia: Secondary | ICD-10-CM | POA: Diagnosis not present

## 2012-06-10 DIAGNOSIS — G4730 Sleep apnea, unspecified: Secondary | ICD-10-CM | POA: Diagnosis present

## 2012-06-10 DIAGNOSIS — E669 Obesity, unspecified: Secondary | ICD-10-CM | POA: Diagnosis present

## 2012-06-10 DIAGNOSIS — F4310 Post-traumatic stress disorder, unspecified: Secondary | ICD-10-CM | POA: Diagnosis not present

## 2012-06-10 DIAGNOSIS — I10 Essential (primary) hypertension: Secondary | ICD-10-CM | POA: Diagnosis present

## 2012-06-10 DIAGNOSIS — E119 Type 2 diabetes mellitus without complications: Secondary | ICD-10-CM | POA: Diagnosis present

## 2012-06-10 DIAGNOSIS — M23349 Other meniscus derangements, anterior horn of lateral meniscus, unspecified knee: Secondary | ICD-10-CM | POA: Diagnosis not present

## 2012-06-10 DIAGNOSIS — N401 Enlarged prostate with lower urinary tract symptoms: Secondary | ICD-10-CM | POA: Diagnosis not present

## 2012-06-10 DIAGNOSIS — X58XXXA Exposure to other specified factors, initial encounter: Secondary | ICD-10-CM | POA: Diagnosis present

## 2012-06-10 DIAGNOSIS — N138 Other obstructive and reflux uropathy: Secondary | ICD-10-CM | POA: Diagnosis not present

## 2012-06-10 DIAGNOSIS — IMO0002 Reserved for concepts with insufficient information to code with codable children: Secondary | ICD-10-CM | POA: Diagnosis not present

## 2012-06-10 DIAGNOSIS — K219 Gastro-esophageal reflux disease without esophagitis: Secondary | ICD-10-CM | POA: Diagnosis present

## 2012-06-10 HISTORY — PX: KNEE ARTHROSCOPY WITH LATERAL MENISECTOMY: SHX6193

## 2012-06-10 HISTORY — PX: PARTIAL KNEE ARTHROPLASTY: SHX2174

## 2012-06-10 LAB — GLUCOSE, CAPILLARY
Glucose-Capillary: 198 mg/dL — ABNORMAL HIGH (ref 70–99)
Glucose-Capillary: 177 mg/dL — ABNORMAL HIGH (ref 70–99)

## 2012-06-10 LAB — TYPE AND SCREEN
ABO/RH(D): AB NEG
Antibody Screen: NEGATIVE

## 2012-06-10 SURGERY — ARTHROSCOPY, KNEE, WITH LATERAL MENISCECTOMY
Anesthesia: Spinal | Laterality: Right | Wound class: Clean

## 2012-06-10 MED ORDER — MENTHOL 3 MG MT LOZG: 1.0000 | LOZENGE | OROMUCOSAL | Status: DC | PRN

## 2012-06-10 MED ORDER — TRAZODONE HCL 150 MG PO TABS
150.0000 mg | ORAL_TABLET | Freq: Every day | ORAL | Status: DC
  Administered 2012-06-10: 150 mg via ORAL
  Filled 2012-06-10 (×2): qty 1

## 2012-06-10 MED ORDER — ACETAMINOPHEN 10 MG/ML IV SOLN
INTRAVENOUS | Status: DC | PRN
  Administered 2012-06-10: 1000 mg via INTRAVENOUS

## 2012-06-10 MED ORDER — CELECOXIB 200 MG PO CAPS
200.0000 mg | ORAL_CAPSULE | Freq: Two times a day (BID) | ORAL | Status: DC
  Administered 2012-06-10 – 2012-06-11 (×2): 200 mg via ORAL
  Filled 2012-06-10 (×3): qty 1

## 2012-06-10 MED ORDER — ZOLPIDEM TARTRATE 5 MG PO TABS: 5.0000 mg | ORAL_TABLET | Freq: Every evening | ORAL | Status: DC | PRN

## 2012-06-10 MED ORDER — BUPIVACAINE-EPINEPHRINE PF 0.25-1:200000 % IJ SOLN
INTRAMUSCULAR | Status: AC
  Filled 2012-06-10: qty 30

## 2012-06-10 MED ORDER — QUETIAPINE FUMARATE 25 MG PO TABS
25.0000 mg | ORAL_TABLET | Freq: Every day | ORAL | Status: DC
  Administered 2012-06-10: 25 mg via ORAL
  Filled 2012-06-10 (×2): qty 1

## 2012-06-10 MED ORDER — DOCUSATE SODIUM 100 MG PO CAPS
100.0000 mg | ORAL_CAPSULE | Freq: Two times a day (BID) | ORAL | Status: DC
  Administered 2012-06-10 – 2012-06-11 (×2): 100 mg via ORAL

## 2012-06-10 MED ORDER — CEFAZOLIN SODIUM-DEXTROSE 2-3 GM-% IV SOLR
INTRAVENOUS | Status: AC
  Filled 2012-06-10: qty 50

## 2012-06-10 MED ORDER — ONDANSETRON HCL 4 MG/2ML IJ SOLN
INTRAMUSCULAR | Status: DC | PRN
  Administered 2012-06-10: 4 mg via INTRAVENOUS

## 2012-06-10 MED ORDER — FERROUS SULFATE 325 (65 FE) MG PO TABS
325.0000 mg | ORAL_TABLET | Freq: Three times a day (TID) | ORAL | Status: DC
  Administered 2012-06-10 – 2012-06-11 (×3): 325 mg via ORAL
  Filled 2012-06-10 (×5): qty 1

## 2012-06-10 MED ORDER — HYDROCODONE-ACETAMINOPHEN 7.5-325 MG PO TABS
1.0000 | ORAL_TABLET | ORAL | Status: DC
  Administered 2012-06-10: 2 via ORAL
  Administered 2012-06-10: 1 via ORAL
  Administered 2012-06-11 (×4): 2 via ORAL
  Filled 2012-06-10 (×6): qty 2

## 2012-06-10 MED ORDER — CLOPIDOGREL BISULFATE 75 MG PO TABS
75.0000 mg | ORAL_TABLET | Freq: Every day | ORAL | Status: DC
  Administered 2012-06-10: 75 mg via ORAL
  Filled 2012-06-10 (×2): qty 1

## 2012-06-10 MED ORDER — NITROGLYCERIN 0.4 MG SL SUBL: 0.4000 mg | SUBLINGUAL_TABLET | SUBLINGUAL | Status: DC | PRN

## 2012-06-10 MED ORDER — MIDAZOLAM HCL 5 MG/5ML IJ SOLN
INTRAMUSCULAR | Status: DC | PRN
  Administered 2012-06-10: 2 mg via INTRAVENOUS

## 2012-06-10 MED ORDER — METHOCARBAMOL 500 MG PO TABS: 500.0000 mg | ORAL_TABLET | Freq: Four times a day (QID) | ORAL | Status: DC | PRN

## 2012-06-10 MED ORDER — HYDROMORPHONE HCL PF 1 MG/ML IJ SOLN: 0.2500 mg | INTRAMUSCULAR | Status: DC | PRN

## 2012-06-10 MED ORDER — LIDOCAINE-EPINEPHRINE 1 %-1:100000 IJ SOLN
INTRAMUSCULAR | Status: AC
  Filled 2012-06-10: qty 1

## 2012-06-10 MED ORDER — POLYETHYLENE GLYCOL 3350 17 G PO PACK
17.0000 g | PACK | Freq: Two times a day (BID) | ORAL | Status: DC
  Administered 2012-06-10 – 2012-06-11 (×2): 17 g via ORAL

## 2012-06-10 MED ORDER — METHOCARBAMOL 100 MG/ML IJ SOLN
500.0000 mg | Freq: Four times a day (QID) | INTRAVENOUS | Status: DC | PRN
  Filled 2012-06-10: qty 5

## 2012-06-10 MED ORDER — ASPIRIN 81 MG PO CHEW
81.0000 mg | CHEWABLE_TABLET | Freq: Every day | ORAL | Status: DC
  Administered 2012-06-10: 81 mg via ORAL
  Filled 2012-06-10 (×2): qty 1

## 2012-06-10 MED ORDER — METOPROLOL TARTRATE 50 MG PO TABS
50.0000 mg | ORAL_TABLET | Freq: Every day | ORAL | Status: DC
  Administered 2012-06-10: 50 mg via ORAL
  Filled 2012-06-10 (×2): qty 1

## 2012-06-10 MED ORDER — HYDROMORPHONE HCL PF 1 MG/ML IJ SOLN
0.5000 mg | INTRAMUSCULAR | Status: DC | PRN
  Administered 2012-06-10: 1 mg via INTRAVENOUS
  Filled 2012-06-10: qty 1

## 2012-06-10 MED ORDER — LACTATED RINGERS IV SOLN: INTRAVENOUS | Status: DC

## 2012-06-10 MED ORDER — ONDANSETRON HCL 4 MG PO TABS: 4.0000 mg | ORAL_TABLET | Freq: Four times a day (QID) | ORAL | Status: DC | PRN

## 2012-06-10 MED ORDER — 0.9 % SODIUM CHLORIDE (POUR BTL) OPTIME
TOPICAL | Status: DC | PRN
  Administered 2012-06-10: 1000 mL

## 2012-06-10 MED ORDER — DIPHENHYDRAMINE HCL 25 MG PO CAPS: 25.0000 mg | ORAL_CAPSULE | Freq: Four times a day (QID) | ORAL | Status: DC | PRN

## 2012-06-10 MED ORDER — ATORVASTATIN CALCIUM 40 MG PO TABS
40.0000 mg | ORAL_TABLET | Freq: Every day | ORAL | Status: DC
  Administered 2012-06-10: 40 mg via ORAL
  Filled 2012-06-10 (×2): qty 1

## 2012-06-10 MED ORDER — LIDOCAINE HCL (CARDIAC) 20 MG/ML IV SOLN
INTRAVENOUS | Status: DC | PRN
  Administered 2012-06-10: 50 mg via INTRAVENOUS

## 2012-06-10 MED ORDER — BUPIVACAINE IN DEXTROSE 0.75-8.25 % IT SOLN
INTRATHECAL | Status: DC | PRN
  Administered 2012-06-10: 1.5 mg via INTRATHECAL

## 2012-06-10 MED ORDER — PROMETHAZINE HCL 25 MG/ML IJ SOLN: 6.2500 mg | INTRAMUSCULAR | Status: DC | PRN

## 2012-06-10 MED ORDER — EPHEDRINE SULFATE 50 MG/ML IJ SOLN
INTRAMUSCULAR | Status: DC | PRN
  Administered 2012-06-10 (×5): 5 mg via INTRAVENOUS

## 2012-06-10 MED ORDER — CHLORHEXIDINE GLUCONATE 4 % EX LIQD
60.0000 mL | Freq: Once | CUTANEOUS | Status: DC
  Filled 2012-06-10: qty 60

## 2012-06-10 MED ORDER — SODIUM CHLORIDE 0.9 % IV SOLN
INTRAVENOUS | Status: DC
  Administered 2012-06-10: 20:00:00 via INTRAVENOUS
  Filled 2012-06-10 (×7): qty 1000

## 2012-06-10 MED ORDER — LINAGLIPTIN 5 MG PO TABS
5.0000 mg | ORAL_TABLET | Freq: Every day | ORAL | Status: DC
  Administered 2012-06-10 – 2012-06-11 (×2): 5 mg via ORAL
  Filled 2012-06-10 (×2): qty 1

## 2012-06-10 MED ORDER — LACTATED RINGERS IV SOLN
INTRAVENOUS | Status: DC
  Administered 2012-06-10: 12:00:00 via INTRAVENOUS
  Administered 2012-06-10: 1000 mL via INTRAVENOUS
  Administered 2012-06-10: 11:00:00 via INTRAVENOUS

## 2012-06-10 MED ORDER — LACTATED RINGERS IR SOLN
Status: DC | PRN
  Administered 2012-06-10: 6000 mL

## 2012-06-10 MED ORDER — CEFAZOLIN SODIUM-DEXTROSE 2-3 GM-% IV SOLR
2.0000 g | Freq: Four times a day (QID) | INTRAVENOUS | Status: AC
  Administered 2012-06-10 (×2): 2 g via INTRAVENOUS
  Filled 2012-06-10 (×2): qty 50

## 2012-06-10 MED ORDER — FENTANYL CITRATE 0.05 MG/ML IJ SOLN
INTRAMUSCULAR | Status: DC | PRN
  Administered 2012-06-10: 100 ug via INTRAVENOUS

## 2012-06-10 MED ORDER — FAMOTIDINE 20 MG PO TABS
20.0000 mg | ORAL_TABLET | Freq: Two times a day (BID) | ORAL | Status: DC
  Administered 2012-06-10 – 2012-06-11 (×2): 20 mg via ORAL
  Filled 2012-06-10 (×3): qty 1

## 2012-06-10 MED ORDER — EZETIMIBE 10 MG PO TABS
10.0000 mg | ORAL_TABLET | Freq: Every day | ORAL | Status: DC
  Administered 2012-06-10: 10 mg via ORAL
  Filled 2012-06-10 (×2): qty 1

## 2012-06-10 MED ORDER — KETOROLAC TROMETHAMINE 30 MG/ML IJ SOLN
INTRAMUSCULAR | Status: AC
  Filled 2012-06-10: qty 1

## 2012-06-10 MED ORDER — KETOROLAC TROMETHAMINE 30 MG/ML IJ SOLN
INTRAMUSCULAR | Status: DC | PRN
  Administered 2012-06-10: 30 mg

## 2012-06-10 MED ORDER — SERTRALINE HCL 50 MG PO TABS
50.0000 mg | ORAL_TABLET | Freq: Every day | ORAL | Status: DC
  Administered 2012-06-10: 50 mg via ORAL
  Filled 2012-06-10 (×2): qty 1

## 2012-06-10 MED ORDER — FLEET ENEMA 7-19 GM/118ML RE ENEM: 1.0000 | ENEMA | Freq: Once | RECTAL | Status: AC | PRN

## 2012-06-10 MED ORDER — ACETAMINOPHEN 10 MG/ML IV SOLN
INTRAVENOUS | Status: AC
  Filled 2012-06-10: qty 100

## 2012-06-10 MED ORDER — AMLODIPINE BESYLATE 10 MG PO TABS
10.0000 mg | ORAL_TABLET | Freq: Every day | ORAL | Status: DC
  Administered 2012-06-10: 10 mg via ORAL
  Filled 2012-06-10 (×2): qty 1

## 2012-06-10 MED ORDER — ONDANSETRON HCL 4 MG/2ML IJ SOLN: 4.0000 mg | Freq: Four times a day (QID) | INTRAMUSCULAR | Status: DC | PRN

## 2012-06-10 MED ORDER — BISACODYL 10 MG RE SUPP: 10.0000 mg | Freq: Every day | RECTAL | Status: DC | PRN

## 2012-06-10 MED ORDER — PANTOPRAZOLE SODIUM 40 MG PO TBEC
40.0000 mg | DELAYED_RELEASE_TABLET | Freq: Every day | ORAL | Status: DC
  Administered 2012-06-10 – 2012-06-11 (×2): 40 mg via ORAL
  Filled 2012-06-10 (×2): qty 1

## 2012-06-10 MED ORDER — MEPERIDINE HCL 50 MG/ML IJ SOLN: 6.2500 mg | INTRAMUSCULAR | Status: DC | PRN

## 2012-06-10 MED ORDER — ALUM & MAG HYDROXIDE-SIMETH 200-200-20 MG/5ML PO SUSP: 30.0000 mL | ORAL | Status: DC | PRN

## 2012-06-10 MED ORDER — PROPOFOL 10 MG/ML IV EMUL
INTRAVENOUS | Status: DC | PRN
  Administered 2012-06-10: 100 ug/kg/min via INTRAVENOUS

## 2012-06-10 MED ORDER — ASPIRIN 81 MG PO TABS: 81.0000 mg | ORAL_TABLET | Freq: Every day | ORAL | Status: DC

## 2012-06-10 MED ORDER — PHENOL 1.4 % MT LIQD: 1.0000 | OROMUCOSAL | Status: DC | PRN

## 2012-06-10 MED ORDER — BUPIVACAINE-EPINEPHRINE 0.25% -1:200000 IJ SOLN
INTRAMUSCULAR | Status: DC | PRN
  Administered 2012-06-10: 30 mL

## 2012-06-10 SURGICAL SUPPLY — 71 items
ADH SKN CLS APL DERMABOND .7 (GAUZE/BANDAGES/DRESSINGS) ×1
BAG SPEC THK2 15X12 ZIP CLS (MISCELLANEOUS) ×1
BAG ZIPLOCK 12X15 (MISCELLANEOUS) ×2 IMPLANT
BANDAGE ELASTIC 6 VELCRO ST LF (GAUZE/BANDAGES/DRESSINGS) ×2 IMPLANT
BANDAGE ESMARK 6X9 LF (GAUZE/BANDAGES/DRESSINGS) ×1 IMPLANT
BLADE CUDA SHAVER 3.5 (BLADE) ×2 IMPLANT
BLADE SAW RECIPROCATING 77.5 (BLADE) ×2 IMPLANT
BLADE SAW SGTL 13.0X1.19X90.0M (BLADE) ×2 IMPLANT
BNDG CMPR 9X6 STRL LF SNTH (GAUZE/BANDAGES/DRESSINGS) ×1
BNDG ESMARK 6X9 LF (GAUZE/BANDAGES/DRESSINGS) ×2
BONE CEMENT GENTAMICIN (Cement) ×2 IMPLANT
BOWL SMART MIX CTS (DISPOSABLE) ×2 IMPLANT
CEMENT BONE GENTAMICIN 40 (Cement) IMPLANT
CLOTH BEACON ORANGE TIMEOUT ST (SAFETY) ×2 IMPLANT
COVER SURGICAL LIGHT HANDLE (MISCELLANEOUS) ×2 IMPLANT
CUFF TOURN SGL QUICK 34 (TOURNIQUET CUFF) ×2
CUFF TRNQT CYL 34X4X40X1 (TOURNIQUET CUFF) ×1 IMPLANT
DERMABOND ADVANCED (GAUZE/BANDAGES/DRESSINGS) ×1
DERMABOND ADVANCED .7 DNX12 (GAUZE/BANDAGES/DRESSINGS) ×1 IMPLANT
DRAPE EXTREMITY T 121X128X90 (DRAPE) ×2 IMPLANT
DRAPE POUCH INSTRU U-SHP 10X18 (DRAPES) ×2 IMPLANT
DRAPE STERI 35X30 U-POUCH (DRAPES) ×1 IMPLANT
DRAPE U-SHAPE 47X51 STRL (DRAPES) ×2 IMPLANT
DRSG AQUACEL AG ADV 3.5X 6 (GAUZE/BANDAGES/DRESSINGS) ×2 IMPLANT
DRSG AQUACEL AG ADV 3.5X10 (GAUZE/BANDAGES/DRESSINGS) ×1 IMPLANT
DRSG EMULSION OIL 3X3 NADH (GAUZE/BANDAGES/DRESSINGS) ×2 IMPLANT
DRSG TEGADERM 2-3/8X2-3/4 SM (GAUZE/BANDAGES/DRESSINGS) ×1 IMPLANT
DRSG TEGADERM 4X4.75 (GAUZE/BANDAGES/DRESSINGS) ×2 IMPLANT
DURAPREP 26ML APPLICATOR (WOUND CARE) ×2 IMPLANT
ELECT REM PT RETURN 9FT ADLT (ELECTROSURGICAL) ×2
ELECTRODE REM PT RTRN 9FT ADLT (ELECTROSURGICAL) ×1 IMPLANT
EVACUATOR 1/8 PVC DRAIN (DRAIN) ×2 IMPLANT
FACESHIELD LNG OPTICON STERILE (SAFETY) ×8 IMPLANT
GAUZE SPONGE 2X2 8PLY STRL LF (GAUZE/BANDAGES/DRESSINGS) ×1 IMPLANT
GLOVE BIOGEL PI IND STRL 7.5 (GLOVE) ×1 IMPLANT
GLOVE BIOGEL PI IND STRL 8 (GLOVE) ×1 IMPLANT
GLOVE BIOGEL PI INDICATOR 7.5 (GLOVE) ×1
GLOVE BIOGEL PI INDICATOR 8 (GLOVE) ×1
GLOVE ECLIPSE 8.0 STRL XLNG CF (GLOVE) IMPLANT
GLOVE ORTHO TXT STRL SZ7.5 (GLOVE) ×4 IMPLANT
GLOVE SURG ORTHO 8.0 STRL STRW (GLOVE) ×2 IMPLANT
GOWN BRE IMP PREV XXLGXLNG (GOWN DISPOSABLE) ×4 IMPLANT
GOWN STRL NON-REIN LRG LVL3 (GOWN DISPOSABLE) ×2 IMPLANT
IMMOBILIZER KNEE 20 (SOFTGOODS) ×2
IMMOBILIZER KNEE 20 THIGH 36 (SOFTGOODS) IMPLANT
KIT BASIN OR (CUSTOM PROCEDURE TRAY) ×2 IMPLANT
LEGGING LITHOTOMY PAIR STRL (DRAPES) ×2 IMPLANT
MANIFOLD NEPTUNE II (INSTRUMENTS) ×4 IMPLANT
NDL SAFETY ECLIPSE 18X1.5 (NEEDLE) ×1 IMPLANT
NDL SPNL 18GX3.5 QUINCKE PK (NEEDLE) IMPLANT
NEEDLE HYPO 18GX1.5 SHARP (NEEDLE) ×2
NEEDLE SPNL 18GX3.5 QUINCKE PK (NEEDLE) ×2 IMPLANT
PACK ARTHROSCOPY WL (CUSTOM PROCEDURE TRAY) ×2 IMPLANT
PACK TOTAL JOINT (CUSTOM PROCEDURE TRAY) ×2 IMPLANT
PADDING CAST COTTON 6X4 STRL (CAST SUPPLIES) ×2 IMPLANT
POSITIONER SURGICAL ARM (MISCELLANEOUS) ×2 IMPLANT
SET ARTHROSCOPY TUBING (MISCELLANEOUS) ×2
SET ARTHROSCOPY TUBING LN (MISCELLANEOUS) ×1 IMPLANT
SPONGE GAUZE 2X2 STER 10/PKG (GAUZE/BANDAGES/DRESSINGS) ×1
SUCTION FRAZIER TIP 10 FR DISP (SUCTIONS) ×2 IMPLANT
SUT ETHILON 4 0 PS 2 18 (SUTURE) ×2 IMPLANT
SUT MNCRL AB 4-0 PS2 18 (SUTURE) ×2 IMPLANT
SUT VIC AB 1 CT1 36 (SUTURE) ×4 IMPLANT
SUT VIC AB 2-0 CT1 27 (SUTURE) ×4
SUT VIC AB 2-0 CT1 TAPERPNT 27 (SUTURE) ×2 IMPLANT
SUT VLOC 180 0 24IN GS25 (SUTURE) ×1 IMPLANT
SYR 50ML LL SCALE MARK (SYRINGE) ×2 IMPLANT
TOWEL OR 17X26 10 PK STRL BLUE (TOWEL DISPOSABLE) ×4 IMPLANT
TRAY FOLEY CATH 14FRSI W/METER (CATHETERS) ×2 IMPLANT
WATER STERILE IRR 1500ML POUR (IV SOLUTION) ×1 IMPLANT
WRAP KNEE MAXI GEL POST OP (GAUZE/BANDAGES/DRESSINGS) ×2 IMPLANT

## 2012-06-10 NOTE — Anesthesia Preprocedure Evaluation (Addendum)
Anesthesia Evaluation  Patient identified by MRN, date of birth, ID band Patient awake    Reviewed: Allergy & Precautions, H&P , NPO status , Patient's Chart, lab work & pertinent test results, reviewed documented beta blocker date and time   History of Anesthesia Complications Negative for: history of anesthetic complications  Airway Mallampati: III TM Distance: >3 FB Neck ROM: full    Dental No notable dental hx. (+) Teeth Intact, Dental Advisory Given and Caps   Pulmonary neg pulmonary ROS, sleep apnea and Continuous Positive Airway Pressure Ventilation ,  breath sounds clear to auscultation  Pulmonary exam normal       Cardiovascular Exercise Tolerance: Good hypertension, On Home Beta Blockers + CAD, + Past MI (MI in April 2011 following cervical disc surgery.) and + Cardiac Stents (Cardiac stents X 4; last stent 2009. Patient denies cardiac symptoms, cleared for surgical procedure  by cardiologist.) Rhythm:regular Rate:Normal  angioplasty   Neuro/Psych PSYCHIATRIC DISORDERS PTSDCervical diskectomy negative neurological ROS  negative psych ROS   GI/Hepatic negative GI ROS, Neg liver ROS, GERD-  Medicated and Controlled,  Endo/Other  negative endocrine ROSdiabetes, Type 2, Oral Hypoglycemic Agents  Renal/GU negative Renal ROS   BPH    Musculoskeletal   Abdominal (+) + obese,   Peds  Hematology negative hematology ROS (+)   Anesthesia Other Findings   Reproductive/Obstetrics negative OB ROS                           Anesthesia Physical  Anesthesia Plan  ASA: III  Anesthesia Plan: Spinal   Post-op Pain Management:    Induction: Intravenous  Airway Management Planned: Simple Face Mask  Additional Equipment:   Intra-op Plan:   Post-operative Plan: Extubation in OR  Informed Consent: I have reviewed the patients History and Physical, chart, labs and discussed the procedure  including the risks, benefits and alternatives for the proposed anesthesia with the patient or authorized representative who has indicated his/her understanding and acceptance.   Dental Advisory Given  Plan Discussed with: CRNA and Surgeon  Anesthesia Plan Comments:         Anesthesia Quick Evaluation

## 2012-06-10 NOTE — Interval H&P Note (Signed)
History and Physical Interval Note:  06/10/2012 9:36 AM  Seth Washington  has presented today for surgery, with the diagnosis of Right Knee Lateral meniscal Tear and Medial Compartmental Osteoarthritis  The various methods of treatment have been discussed with the patient and family. After consideration of risks, benefits and other options for treatment, the patient has consented to  Procedure(s) (LRB) with comments: KNEE ARTHROSCOPY WITH LATERAL MENISECTOMY (Right) UNICOMPARTMENTAL KNEE (Right) - Right Medial Uni Knee as a surgical intervention .  The patient's history has been reviewed, patient examined, no change in status, stable for surgery.  I have reviewed the patient's chart and labs.  Questions were answered to the patient's satisfaction.     Shelda Pal

## 2012-06-10 NOTE — Progress Notes (Signed)
Utilization review completed.  

## 2012-06-10 NOTE — Transfer of Care (Signed)
Immediate Anesthesia Transfer of Care Note  Patient: Seth Washington  Procedure(s) Performed: Procedure(s) (LRB) with comments: KNEE ARTHROSCOPY WITH LATERAL MENISECTOMY (Right) UNICOMPARTMENTAL KNEE (Right) - Right Medial Uni Knee  Patient Location: PACU  Anesthesia Type:Spinal  Level of Consciousness: awake, alert , oriented and patient cooperative  Airway & Oxygen Therapy: Patient Spontanous Breathing and Patient connected to face mask oxygen  Post-op Assessment: Report given to PACU RN and Post -op Vital signs reviewed and stable  Post vital signs: Reviewed and stable  Complications: No apparent anesthesia complications

## 2012-06-10 NOTE — Op Note (Signed)
NAME: Seth Washington    MEDICAL RECORD NO.: [id number]   FACILITY: [facility]   DATE OF BIRTH: [birth year]  PHYSICIAN: Madlyn Frankel. Charlann Boxer, M.D.    DATE OF PROCEDURE: 06/10/2012    OPERATIVE REPORT   PREOPERATIVE DIAGNOSIS: Right knee medial compartment osteoarthritis.   POSTOPERATIVE DIAGNOSIS: Right knee medial compartment osteoarthritis.   PROCEDURE: Right partial knee replacement utilizing Biomet Oxford knee  component, size Medium femur, a right medial size B tibial tray with a size 5 insert.   Note that this was the second part of a 2 procedures preformed on this knee.  The arthroscopic portion of the case was dictated as separate note  SURGEON: Madlyn Frankel. Charlann Boxer, M.D.   ASSISTANT: Lanney Gins, PAC.  Please note that [name] was present for the entirety of the case,  utilized for preoperative positioning, perioperative retractor  management, general facilitation of the case and primary wound closure.   ANESTHESIA: Spinal.   SPECIMENS: None.   COMPLICATIONS: None.  DRAINS: 1 medium HV   TOURNIQUET TIME: 37 minutes at 250 mmHg.   INDICATIONS FOR PROCEDURE: Seth Washington is 19 patient of mine who presented for evaluation of right knee pain.  They presented with primary complaints of pain on the medial side of their knee. Radiographs revealed advanced medial compartment arthritis with specifically an antero-medial wear pattern.  In addition MRI evaluation revealed lateral meniscal tearing.  There was bone on bone changes noted with subchondral sclerosis and osteophytes present. The patient has had progressive problems failing to respond to conservative measures of medications, injections and activity modification. Risks of infection, DVT, component failure, need for future revision surgery were all discussed and reviewed.  Consent was obtained for benefit of pain relief.   PROCEDURE IN DETAIL: The patient was brought to the operative theater.  Once adequate anesthesia,  preoperative antibiotics, 2 grams Ancef administered, the patient was positioned in supine position with a right thigh tourniquet  placed. The right lower extremity was prepped and draped in sterile  fashion with the leg on the Oxford leg holder.  The leg was allowed to flex to 120 degrees. A time-out  was performed identifying the patient, planned procedure, and extremity.  The leg was exsanguinated, tourniquet elevated to 250 mmHg. A midline  incision was made from the proximal pole of the patella to the tibial tubercle. A  soft tissue plane was created and partial median arthrotomy was then  made to allow for subluxation of the patella. Following initial synovectomy and  debridement, the osteophytes were removed off the medial aspect of the  knee.   Attention was first directed to the tibia. The tibial  extramedullary guide was positioned over the anterior crest of the tibia  and pinned into position, and using a measured resection guide from the  Oxford system, a 4 mm resection was made off the proximal tibia. First  the reciprocating saw along the medial aspect of the tibial spines, then the oscillating saw.    At this point, I sized this cut surface seem to be best fit for a size B medial tibial tray.  With the retractors out of the wound and the knee held at 90 degrees the 4 feeler gauge had appropriate tension on the medial ligament.   At this point, the femoral canal was opened with a drill and the  intramedullary rod passed. Then using the guide for a medium resection off  the posterior aspect of the femur was positioned over the mid portion  of the medial femoral condyle.  The orientation was set using the guide that mates the femoral guide to the intramedullary rod.  The 2 drill holes were made into the distal femur.  The posterior guide was then impacted into place and the posterior  femoral cut made.  At this point, I milled the distal femur with a size 4 spigot in place. At  this point, we did a trial reduction of the medium femur, size B medial tibial tray and a size 4 insert. At 90 degrees of  flexion and at 20 degrees of flexion the knee had symmetric tension on  the ligaments.   Given these findings, the trial femoral component was removed. Final preparation of tibia was carried out by pinning it in position. Then  using a reciprocating saw I removed bone for the keel. Further bone was  removed with an osteotome.  Trial reduction was now carried out with the medium femur, the B medial tibia, and at first the 4 lollipop insert and then a trial with the 5. The balance of the  ligaments appeared to be symmetric at 20 degrees and 90 degrees. Given  all these findings, the trial components were removed.   Cement was mixed. The final components were opened. The knee was irrigated with  normal saline solution. Then final debridements of the  soft tissue was carried out, I also drilled the sclerotic bone with a drill.  The final components were cemented with a single batch of cement in a  two-stage technique with the tibial component cemented first. The knee  was then brought  to 45 degrees of flexion with a 5 feeler gauge, held with pressure for a minute and half.  After this the femoral component was cemented in place.  The knee was again held at 45 degrees of flexion while the cement fully cured.  Excess cement was removed throughout the knee. Tourniquet was let down  after 37 minutes. After the cement had fully cured and excessive cement  was removed throughout the knee there was no visualized cement present.   The final size 5 medium right medial insert was chosen and snapped into position. We re-irrigated  the knee. I placed a medium Hemovac drain deep. The extensor mechanism  was then reapproximated using a #1 Vicryl with the knee in flexion. The  remaining wound was closed with 2-0 Vicryl and a running 4-0 Monocryl.  The knee was cleaned, dried, and  dressed sterilely using Dermabond and  Aquacel dressing. The drain site was dressed separately. The patient  was brought to the recovery room, Ace wrap in place, tolerating the  procedure well. He will be in the hospital for overnight observation.  We will initiate physical therapy and progress to ambulate.     Madlyn Frankel Charlann Boxer, M.D.

## 2012-06-10 NOTE — Anesthesia Procedure Notes (Signed)
Spinal  Patient location during procedure: OR Start time: 06/10/2012 10:25 AM End time: 06/10/2012 10:30 AM Staffing Performed by: resident/CRNA  Preanesthetic Checklist Completed: patient identified, site marked, surgical consent, pre-op evaluation, timeout performed, IV checked, risks and benefits discussed and monitors and equipment checked Spinal Block Patient position: sitting Prep: Betadine Patient monitoring: heart rate, cardiac monitor, continuous pulse ox and blood pressure Approach: midline Location: L3-4 Injection technique: single-shot Needle Needle type: Spinocan  Needle gauge: 22 G Needle length: 9 cm Needle insertion depth: 7 cm Assessment Sensory level: T10

## 2012-06-10 NOTE — Brief Op Note (Signed)
06/10/2012  12:11 PM  PATIENT:  Seth Washington  62 y.o. male  PRE-OPERATIVE DIAGNOSIS: 1.  Right Knee Lateral meniscal Tear and  2. Medial Compartmental Osteoarthritis  POST-OPERATIVE DIAGNOSIS:  1.  Right Knee Lateral meniscal Tear and 2.  Medial Compartmental Osteoarthritis  PROCEDURE:  Procedure(s) (LRB) with comments: KNEE ARTHROSCOPY WITH LATERAL MENISECTOMY (Right) UNICOMPARTMENTAL KNEE (Right) - Right Medial Uni Knee  SURGEON:  Surgeon(s) and Role:    * Shelda Pal, MD - Primary  PHYSICIAN ASSISTANT: Lanney Gins, PA-C  ANESTHESIA:   spinal  EBL:  Total I/O In: 1000 [I.V.:1000] Out: 330 [Urine:280; Blood:50]  BLOOD ADMINISTERED:none  DRAINS: (1 medium) Hemovact drain(s) in the right knee with  Suction Open   LOCAL MEDICATIONS USED:  MARCAINE     SPECIMEN:  No Specimen  DISPOSITION OF SPECIMEN:  N/A  COUNTS:  YES  TOURNIQUET:   Total Tourniquet Time Documented: Thigh (Right) - 38 minutes  DICTATION: .Other Dictation: Dictation Number [id number]  PLAN OF CARE: Admit for overnight observation  PATIENT DISPOSITION:  PACU - hemodynamically stable.   Delay start of Pharmacological VTE agent (>24hrs) due to surgical blood loss or risk of bleeding: no

## 2012-06-10 NOTE — Anesthesia Postprocedure Evaluation (Signed)
  Anesthesia Post-op Note  Patient: Seth Washington  Procedure(s) Performed: Procedure(s) (LRB): KNEE ARTHROSCOPY WITH LATERAL MENISECTOMY (Right) UNICOMPARTMENTAL KNEE (Right)  Patient Location: PACU  Anesthesia Type: Spinal  Level of Consciousness: awake and alert   Airway and Oxygen Therapy: Patient Spontanous Breathing  Post-op Pain: mild  Post-op Assessment: Post-op Vital signs reviewed, Patient's Cardiovascular Status Stable, Respiratory Function Stable, Patent Airway and No signs of Nausea or vomiting  Last Vitals:  Filed Vitals:   06/10/12 1347  BP: 148/88  Pulse: 52  Temp: 36.3 C  Resp: 16    Post-op Vital Signs: stable   Complications: No apparent anesthesia complications

## 2012-06-11 DIAGNOSIS — E669 Obesity, unspecified: Secondary | ICD-10-CM

## 2012-06-11 DIAGNOSIS — E871 Hypo-osmolality and hyponatremia: Secondary | ICD-10-CM

## 2012-06-11 LAB — GLUCOSE, CAPILLARY
Glucose-Capillary: 132 mg/dL — ABNORMAL HIGH (ref 70–99)
Glucose-Capillary: 177 mg/dL — ABNORMAL HIGH (ref 70–99)

## 2012-06-11 LAB — CBC
Hemoglobin: 13 g/dL (ref 13.0–17.0)
Platelets: 142 10*3/uL — ABNORMAL LOW (ref 150–400)
RBC: 4.19 MIL/uL — ABNORMAL LOW (ref 4.22–5.81)
WBC: 8.7 10*3/uL (ref 4.0–10.5)

## 2012-06-11 LAB — BASIC METABOLIC PANEL
CO2: 28 mEq/L (ref 19–32)
Chloride: 101 mEq/L (ref 96–112)
Glucose, Bld: 148 mg/dL — ABNORMAL HIGH (ref 70–99)
Potassium: 4.5 mEq/L (ref 3.5–5.1)
Sodium: 134 mEq/L — ABNORMAL LOW (ref 135–145)

## 2012-06-11 MED ORDER — METHOCARBAMOL 500 MG PO TABS: 500.0000 mg | ORAL_TABLET | Freq: Four times a day (QID) | ORAL | Status: DC | PRN

## 2012-06-11 MED ORDER — HYDROCODONE-ACETAMINOPHEN 7.5-325 MG PO TABS: 1.0000 | ORAL_TABLET | ORAL | Status: DC | PRN

## 2012-06-11 MED ORDER — DIPHENHYDRAMINE HCL 25 MG PO CAPS: 25.0000 mg | ORAL_CAPSULE | Freq: Four times a day (QID) | ORAL | Status: DC | PRN

## 2012-06-11 MED ORDER — POLYETHYLENE GLYCOL 3350 17 G PO PACK: 17.0000 g | PACK | Freq: Two times a day (BID) | ORAL | Status: DC

## 2012-06-11 MED ORDER — FERROUS SULFATE 325 (65 FE) MG PO TABS: 325.0000 mg | ORAL_TABLET | Freq: Three times a day (TID) | ORAL | Status: DC

## 2012-06-11 NOTE — Evaluation (Signed)
Physical Therapy Evaluation Patient Details Name: Seth Washington MRN: [id number] DOB: 1950-07-25 Today's Date: 06/11/2012 Time: 9528-4132 PT Time Calculation (min): 35 min  PT Assessment / Plan / Recommendation Clinical Impression  Pt s/p R UKR presents with decreased R LE strength/ROM and post op pain limiting functional mobility    PT Assessment  Patient needs continued PT services    Follow Up Recommendations  Home health PT    Does the patient have the potential to tolerate intense rehabilitation      Barriers to Discharge None      Equipment Recommendations  Rolling walker with 5" wheels    Recommendations for Other Services OT consult   Frequency 7X/week    Precautions / Restrictions Precautions Precautions: Knee;Fall Required Braces or Orthoses: Knee Immobilizer - Right Knee Immobilizer - Right: Discontinue once straight leg raise with < 10 degree lag (Pt performed IND SLR this date) Restrictions Weight Bearing Restrictions: No Other Position/Activity Restrictions: WBAT   Pertinent Vitals/Pain 2/10; premedicated; cold packs provided      Mobility  Bed Mobility Bed Mobility: Supine to Sit Supine to Sit: 5: Supervision Details for Bed Mobility Assistance: min cues for sequence Transfers Transfers: Sit to Stand;Stand to Sit Sit to Stand: 4: Min guard Stand to Sit: 4: Min guard Details for Transfer Assistance: cues for sequence and for LE management and use of UEs to self assist Ambulation/Gait Ambulation/Gait Assistance: 4: Min assist Ambulation Distance (Feet): 200 Feet Assistive device: Rolling walker Ambulation/Gait Assistance Details: cues for position from RW, posture and sequence - pt impulsive and with noted knee buckling when out of sequence and outside RW Gait Pattern: Step-to pattern;Step-through pattern    Shoulder Instructions     Exercises Total Joint Exercises Ankle Circles/Pumps: AROM;15 reps;Supine;Both Quad Sets: AROM;10  reps;Supine;Both Heel Slides: AAROM;10 reps;Supine;Right Straight Leg Raises: AROM;AAROM;Right;15 reps;Supine   PT Diagnosis: Difficulty walking  PT Problem List: Decreased strength;Decreased range of motion;Decreased activity tolerance;Decreased mobility;Decreased knowledge of use of DME;Pain PT Treatment Interventions: DME instruction;Gait training;Stair training;Functional mobility training;Therapeutic activities;Therapeutic exercise;Patient/family education   PT Goals Acute Rehab PT Goals PT Goal Formulation: With patient Time For Goal Achievement: 06/13/12 Potential to Achieve Goals: Good Pt will go Supine/Side to Sit: with supervision PT Goal: Supine/Side to Sit - Progress: Goal set today Pt will go Sit to Supine/Side: with supervision PT Goal: Sit to Supine/Side - Progress: Goal set today Pt will go Sit to Stand: with supervision PT Goal: Sit to Stand - Progress: Goal set today Pt will go Stand to Sit: with supervision PT Goal: Stand to Sit - Progress: Goal set today Pt will Ambulate: >150 feet;with supervision;with rolling walker PT Goal: Ambulate - Progress: Goal set today Pt will Go Up / Down Stairs: Flight;with min assist;with least restrictive assistive device PT Goal: Up/Down Stairs - Progress: Goal set today  Visit Information  Last PT Received On: 06/11/12 Assistance Needed: +1    Subjective Data  Subjective: I'm ready to rock Patient Stated Goal: Resume previous lifestyle with decreased pain   Prior Functioning  Home Living Lives With: Spouse Available Help at Discharge: Family Type of Home: House Home Access: Stairs to enter Secretary/administrator of Steps: 3 Entrance Stairs-Rails: None Home Layout: Two level Alternate Level Stairs-Number of Steps: 16 Alternate Level Stairs-Rails: Left Home Adaptive Equipment: None Prior Function Level of Independence: Independent Able to Take Stairs?: Yes Driving: Yes Communication Communication: No difficulties     Cognition  Overall Cognitive Status: Appears within functional limits for tasks  assessed/performed Arousal/Alertness: Awake/alert Orientation Level: Appears intact for tasks assessed Behavior During Session: [facility] for tasks performed Cognition - Other Comments: IMPULSIVE    Extremity/Trunk Assessment Right Upper Extremity Assessment RUE ROM/Strength/Tone: WFL for tasks assessed Left Upper Extremity Assessment LUE ROM/Strength/Tone: WFL for tasks assessed Right Lower Extremity Assessment RLE ROM/Strength/Tone: Deficits RLE ROM/Strength/Tone Deficits: QUADS 3/5 with AAROM at knee -5 - 90 Left Lower Extremity Assessment LLE ROM/Strength/Tone: WFL for tasks assessed   Balance    End of Session PT - End of Session Equipment Utilized During Treatment: Gait belt Activity Tolerance: Patient tolerated treatment well Patient left: in chair;with call bell/phone within reach Nurse Communication: Mobility status  GP     Magnum Lunde 06/11/2012, 8:43 AM

## 2012-06-11 NOTE — Care Management Note (Signed)
    Page 1 of 2   06/11/2012     6:05:01 PM   CARE MANAGEMENT NOTE 06/11/2012  Patient:  Seth Washington, Seth Washington   Account Number:  [id number]  Date Initiated:  06/11/2012  Documentation initiated by:  Colleen Can  Subjective/Objective Assessment:   DX partial kee replacemnt     Action/Plan:   CM spoke with patient and spouse. aplans are for patient to return to his home where spouse will be caregiver. ahe will need RW and HH services. He is requesting [facility]. He has CPap from [facility]   Anticipated DC Date:  06/11/2012   Anticipated DC Plan:  HOME W HOME HEALTH SERVICES  In-house referral  NA      DC Planning Services  CM consult      [facility] Choice  HOME HEALTH  DURABLE MEDICAL EQUIPMENT   Choice offered to / List presented to:  C-1 Patient   DME arranged  Levan Hurst      DME agency  [facility]     HH arranged  HH-2 PT      [facility] agency  [facility]   Status of service:  Completed, signed off Medicare Important Message given?  NA - LOS <3 / Initial given by admissions (If response is "NO", the following Medicare IM given date fields will be blank) Date Medicare IM given:   Date Additional Medicare IM given:    Discharge Disposition:  HOME W HOME HEALTH SERVICES  Per UR Regulation:    If discussed at Long Length of Stay Meetings, dates discussed:    Comments:  06/11/2012 Colleen Can BSN RN CCM [phone] [facility] can provide [facility] services with start date of tomorrow 06/12/2012.

## 2012-06-11 NOTE — Op Note (Signed)
[name], [name]               ACCOUNT NO.:  [id number]  MEDICAL RECORD NO.:  [id number]  LOCATION:  1613                         FACILITY:  [facility]  PHYSICIAN:  Madlyn Frankel. Charlann Boxer, M.D.  DATE OF BIRTH:  [birth year]  DATE OF PROCEDURE:  06/10/2012 DATE OF DISCHARGE:                              OPERATIVE REPORT   PREOPERATIVE DIAGNOSES: 1. Right knee lateral meniscal tear. 2. Right knee medial compartment osteoarthritis.  POSTOPERATIVE DIAGNOSES: 1. Right knee lateral meniscal tear. 2. Right knee medial compartment osteoarthritis.  PROCEDURE: 1. Right knee diagnostic and operative arthroscopy with lateral     partial meniscectomy. 2. Right partial medial compartment arthroplasty to be in Epic through     template.  Components used on the partial knee replacement were a     Biomet partial knee size, medium femur size B right medial tibial     tray, and a size 5 insert to match the medium femur.  SURGEON:  Madlyn Frankel. Charlann Boxer, M.D.  ASSISTANT:  Lanney Gins, PA-C.  Note that, [name] was present for the entirety of the case including arthroscopic portion for managed with the upper extremity valgus and a load needed to be applied.  Based on positioning standpoint, he was utilized to stabilize the leg for this procedure.  He is also utilized on the right partial knee replacement for the standard routine management of the operative extremity, protection of vital tissues, general facilitation and primary wound closure.  ANESTHESIA:  Spinal.  SPECIMENS:  None.  COMPLICATIONS:  None.  DRAINS:  One medium Hemovac injection.  We did inject Marcaine in the knee at the end of the case.  Tourniquet time was 38 minutes at 250 mmHg.  INDICATIONS FOR PROCEDURE:  [name] is a 62 year old gentleman who presented for evaluation of his right knee progressive degenerative changes and MRI prior to evaluation revealed lateral meniscal pathology. Based on his age, desired activity  level, as well as his predominant source of pain, we discussed partial knee replacement as a very good option for him at this point.  I did feel that based on the findings of his lateral meniscal tear that if we at least stabilize this arthroscopically at the time of this procedure, this could be addressed and then should not affect him long-term to provide, to remove meniscal pathology and prevent further need for surgery down the road.  Consent was obtained for benefit of both procedures.  Standard risks of infection, DVT, component failure, need for future revision surgery discussed in setting of the partial knee replacement.  PROCEDURE IN DETAIL:  The patient was brought to the operative theater. Once adequate anesthesia, preoperative antibiotics, Ancef administered, he was positioned to supine.  His right leg was draped over the Oxford leg holder for the partial knee replacement, but also for the arthroscopic portion.  His left foot was placed into the leg holder and the foot of the bed dropped.  Following previous draping and scrubbing of the right lower extremity, the right leg was then prepped and draped in sterile fashion, utilizing the capture drape for the arthroscopic portion and fluids.  A time-out was performed identifying the patient, planned procedure, and his  planned first procedure which was arthroscopic surgery.  A separate and second time-out was performed for his partial knee replacement.  I demarcated his incision line for his partial knee replacement through an old incision he had medially.  Arthroscopic portals were made within this including the superior medial and inferior medial portals and new inferolateral portal created. Diagnostic evaluation knee through the arthroscope revealed an intact patellofemoral compartment, advanced degenerative changes medially as noted, and intact anterior cruciate ligament and laterally, as we were addressing the lateral  meniscal tearing.  The inferior medial portal, I was able to use as a sole working portal with a varus load applied across the knee with a figure 4 technique with the leg stabilized and was able to use a 3.5 Cuda shaver and debride the central tearing of the meniscus, which was basically from the anterior rim and significantly posteriorly.  Once this was stabilized, final pictures obtained, this portion of the procedure was concluded.  The entire arthroscopic fluid was removed from the field.  We kept the standard drape.  I did at this point to clean the knee off covered and then exsanguinated the knee for the second procedure.     Madlyn Frankel Charlann Boxer, M.D.     MDO/MEDQ  D:  06/10/2012  T:  06/11/2012  Job:  [id number]

## 2012-06-11 NOTE — Evaluation (Signed)
Occupational Therapy Evaluation Patient Details Name: Seth [place] MRN: [id number] DOB: May 25, 1951 Today's Date: 06/11/2012 Time: 4098-1191 OT Time Calculation (min): 17 min  OT Assessment / Plan / Recommendation Clinical Impression  Pt doing well POD 1 R uniknee. All education completed. Pt will have necessary level of A upon d/c.     OT Assessment  Patient does not need any further OT services    Follow Up Recommendations  No OT follow up    Barriers to Discharge      Equipment Recommendations  None recommended by OT    Recommendations for Other Services    Frequency       Precautions / Restrictions Precautions Precautions: Fall;Knee Required Braces or Orthoses: Knee Immobilizer - Right Knee Immobilizer - Right:  (Pt performed IND SLR this date) Restrictions Weight Bearing Restrictions: No Other Position/Activity Restrictions: WBAT   Pertinent Vitals/Pain Pt reported 1/10 pain R knee. Repositioned for comfort.    ADL  Grooming: Min guard Where Assessed - Grooming: Supported standing Upper Body Bathing: Set up Where Assessed - Upper Body Bathing: Unsupported sitting Lower Body Bathing: Minimal assistance Where Assessed - Lower Body Bathing: Supported sit to stand Upper Body Dressing: Set up Where Assessed - Upper Body Dressing: Unsupported sitting Lower Body Dressing: Minimal assistance Where Assessed - Lower Body Dressing: Supported sit to stand Toilet Transfer: Hydrographic surveyor Method: Sit to Barista: Comfort height toilet Toileting - Architect and Hygiene: Min guard Where Assessed - Engineer, mining and Hygiene: Sit to stand from 3-in-1 or toilet Equipment Used: Rolling walker Transfers/Ambulation Related to ADLs: Pt ambulated to the bathroom with minguard A and mod cues for step sequence. ADL Comments: Educated pt and wife in technique for LB dressing.    OT Diagnosis:    OT Problem List:   OT  Treatment Interventions:     OT Goals    Visit Information  Last OT Received On: 06/11/12 Assistance Needed: +1    Subjective Data  Subjective: I'll be fine once I get home. Patient Stated Goal: Not asked.   Prior Functioning     Home Living Lives With: Spouse Available Help at Discharge: Family Type of Home: House Home Access: Stairs to enter Secretary/administrator of Steps: 3 Entrance Stairs-Rails: None Home Layout: Two level Alternate Level Stairs-Number of Steps: 16 Alternate Level Stairs-Rails: Left Bathroom Shower/Tub: Health visitor: Standard Home Adaptive Equipment: None Prior Function Level of Independence: Independent Able to Take Stairs?: Yes Driving: Yes Vocation: Retired Musician: No difficulties Dominant Hand: Right         Vision/Perception     Cognition  Overall Cognitive Status: Appears within functional limits for tasks assessed/performed Arousal/Alertness: Awake/alert Orientation Level: Appears intact for tasks assessed Behavior During Session: [facility] for tasks performed Cognition - Other Comments: Impulsive    Extremity/Trunk Assessment Right Upper Extremity Assessment RUE ROM/Strength/Tone: WFL for tasks assessed Left Upper Extremity Assessment LUE ROM/Strength/Tone: WFL for tasks assessed     Mobility Bed Mobility Supine to Sit: 5: Supervision Transfers Sit to Stand: 5: Supervision;4: Min guard;With upper extremity assist;From chair/3-in-1;From bed;With armrests Stand to Sit: 5: Supervision;4: Min guard;With upper extremity assist;With armrests;To chair/3-in-1 Details for Transfer Assistance: min cues for saftey      Shoulder Instructions     Exercise     Balance     End of Session OT - End of Session Activity Tolerance: Patient tolerated treatment well Patient left: in chair;with call bell/phone within  reach  GO     Skyelyn Scruggs A OTR/L [phone] 06/11/2012, 3:20 PM

## 2012-06-11 NOTE — Progress Notes (Signed)
   Subjective: 1 Day Post-Op Procedure(s) (LRB): KNEE ARTHROSCOPY WITH LATERAL MENISECTOMY (Right) UNICOMPARTMENTAL KNEE (Right)   Patient reports pain as mild, pain well controlled. No events throughout the night. Ready to be discharged home if he does well with PT.  Objective:   VITALS:   Filed Vitals:   06/11/12 1037  BP: 96/56  Pulse: 61  Temp: 97.3 F (36.3 C)  Resp: 14    Neurovascular intact Dorsiflexion/Plantar flexion intact Incision: dressing C/D/I No cellulitis present Compartment soft  LABS  Basename 06/11/12 0442  HGB 13.0  HCT 36.9*  WBC 8.7  PLT 142*     Basename 06/11/12 0442  NA 134*  K 4.5  BUN 13  CREATININE 0.93  GLUCOSE 148*     Assessment/Plan: 1 Day Post-Op Procedure(s) (LRB): KNEE ARTHROSCOPY WITH LATERAL MENISECTOMY (Right) UNICOMPARTMENTAL KNEE (Right) HV drain d/c'ed Foley cath d/c'ed Advance diet Up with therapy D/C IV fluids Discharge home with home health Follow up in 2 weeks at [facility]. Follow up with OLIN,Addasyn Mcbreen D in 2 weeks.  Contact information:  [facility] [address], [address] [place] [address] [phone]     Obese (BMI 30-39.9)  Estimated Body mass index is 33.00 kg/(m^2) as calculated from the following:   Height as of this encounter: 5\' 10" (1.778 m).   Weight as of this encounter: 230 lb(104.327 kg). Patient also counseled that weight may inhibit the healing process Patient counseled that losing weight will help with future health issues  Hyponatremia Treated with IV fluids and will observe     Anastasio Auerbach. Evalee Gerard   PAC  06/11/2012, 11:06 AM

## 2012-06-12 DIAGNOSIS — I10 Essential (primary) hypertension: Secondary | ICD-10-CM | POA: Diagnosis not present

## 2012-06-12 DIAGNOSIS — Z96649 Presence of unspecified artificial hip joint: Secondary | ICD-10-CM | POA: Diagnosis not present

## 2012-06-12 DIAGNOSIS — M159 Polyosteoarthritis, unspecified: Secondary | ICD-10-CM | POA: Diagnosis not present

## 2012-06-12 DIAGNOSIS — I2510 Atherosclerotic heart disease of native coronary artery without angina pectoris: Secondary | ICD-10-CM | POA: Diagnosis not present

## 2012-06-12 DIAGNOSIS — IMO0001 Reserved for inherently not codable concepts without codable children: Secondary | ICD-10-CM | POA: Diagnosis not present

## 2012-06-12 DIAGNOSIS — E119 Type 2 diabetes mellitus without complications: Secondary | ICD-10-CM | POA: Diagnosis not present

## 2012-06-12 NOTE — Discharge Summary (Signed)
Physician Discharge Summary  Patient ID: Seth [place] MRN: [id number] DOB/AGE: March 25, 1951 62 y.o.  Admit date: 06/10/2012 Discharge date: 06/11/2012   Procedures:  Procedure(s) (LRB): KNEE ARTHROSCOPY WITH LATERAL MENISECTOMY (Right) UNICOMPARTMENTAL KNEE (Right)  Attending Physician:  Dr. Durene Romans   Admission Diagnoses:   Right knee OA / pain  Discharge Diagnoses:  Principal Problem:  *S/P right UKA Active Problems:  Obese  Hyponatremia Mental disorder - PTSD   Hypertension   Sleep apnea - uses cpap nightly   Coronary artery disease - stents x4   Hyperlipidemia   BPH   Myocardial infarction - stent placed-2009 adn 2006 hx of mi x 3   Diabetes mellitus - type 2 on pills   GERD (gastroesophageal reflux disease)   Arthritis - generalized   Complication of anesthesia    HPI: Seth [place], 62 y.o. male, has a history of pain and functional disability in the right knee due to arthritis and has failed non-surgical conservative treatments for greater than 12 weeks to includeNSAID's and/or analgesics, corticosteriod injections, weight reduction as appropriate and activity modification. Onset of symptoms was gradual, starting 3 years ago with gradually worsening course since that time. The patient noted no past surgery on the right knee(s). Patient currently rates pain in the right knee(s) at 7 out of 10 with activity. Patient has worsening of pain with activity and weight bearing, pain that interferes with activities of daily living, pain with passive range of motion, crepitus and joint swelling. Patient has evidence of periarticular osteophytes and joint space narrowing by imaging studies. A MRI of the right knee revealed a lateral mensicus tear. There is no active infection. Risks, benefits and expectations were discussed with the patient. Patient understand the risks, benefits and expectations and wishes to proceed with surgery.   PCP: Astrid Divine, MD    Discharged Condition: good  Hospital Course:  Patient underwent the above stated procedure on 06/10/2012. Patient tolerated the procedure well and brought to the recovery room in good condition and subsequently to the floor.  POD #1 BP: 96/56 ; Pulse: 61 ; Temp: 97.3 F (36.3 C) ; Resp: 14  Pt's foley was removed, as well as the hemovac drain removed. IV was changed to a saline lock. Patient reports pain as mild, pain well controlled. No events throughout the night. Ready to be discharged home if he does well with PT. Neurovascular intact, dorsiflexion/plantar flexion intact, incision: dressing C/D/I, no cellulitis present and compartment soft.   LABS  Basename  06/11/12 0442   HGB  13.0  HCT  36.9    Discharge Exam: General appearance: alert, cooperative and no distress Extremities: Homans sign is negative, no sign of DVT, no edema, redness or tenderness in the calves or thighs and no ulcers, gangrene or trophic changes  Disposition:   Home or Self Care with follow up in 2 weeks   Follow-up Information    Follow up with Shelda Pal, MD. In 2 weeks.   Contact information:   [address] [place] [address] [phone]          Discharge Orders    Future Orders Please Complete By Expires   Diet - low sodium heart healthy      Call MD / Call 911      Comments:   If you experience chest pain or shortness of breath, CALL 911 and be transported to the hospital emergency room.  If you develope a fever above 101 F,  pus (white drainage) or increased drainage or redness at the wound, or calf pain, call your surgeon's office.   Discharge instructions      Comments:   Maintain surgical dressing for 10-14 days, then replace with gauze and tape. Keep the area dry and clean until follow up. Follow up in 2 weeks at [facility]. Call with any questions or concerns.   Constipation Prevention      Comments:   Drink plenty of fluids.  Prune juice may be  helpful.  You may use a stool softener, such as Colace (over the counter) 100 mg twice a day.  Use MiraLax (over the counter) for constipation as needed.   Increase activity slowly as tolerated      Weight bearing as tolerated      TED hose      Comments:   Use stockings (TED hose) for 2 weeks on both leg(s).  You may remove them at night for sleeping.   Change dressing      Comments:   Maintain surgical dressing for 10-14 days, then change the dressing daily with sterile 4 x 4 inch gauze dressing and tape. Keep the area dry and clean.      Discharge Medication List as of 06/11/2012 11:15 AM    START taking these medications   Details  diphenhydrAMINE (BENADRYL) 25 mg capsule Take 1 capsule (25 mg total) by mouth every 6 (six) hours as needed for itching, allergies or sleep., Starting 06/11/2012, Until Discontinued, No Print    ferrous sulfate 325 (65 FE) MG tablet Take 1 tablet (325 mg total) by mouth 3 (three) times daily after meals., Starting 06/11/2012, Until Discontinued, No Print    HYDROcodone-acetaminophen (NORCO) 7.5-325 MG per tablet Take 1-2 tablets by mouth every 4 (four) hours as needed for pain., Starting 06/11/2012, Until Discontinued, Print    methocarbamol (ROBAXIN) 500 MG tablet Take 1 tablet (500 mg total) by mouth every 6 (six) hours as needed (muscle spasms)., Starting 06/11/2012, Until Discontinued, No Print    polyethylene glycol (MIRALAX / GLYCOLAX) packet Take 17 g by mouth 2 (two) times daily., Starting 06/11/2012, Until Discontinued, No Print      CONTINUE these medications which have NOT CHANGED   Details  amLODipine (NORVASC) 10 MG tablet Take 10 mg by mouth at bedtime. , Until Discontinued, Historical Med    aspirin 81 MG tablet Take 81 mg by mouth at bedtime. Instructed to stay on per [name],and [name], Until Discontinued, Historical Med    atorvastatin (LIPITOR) 40 MG tablet Take 40 mg by mouth at bedtime., Until Discontinued, Historical Med      ezetimibe (ZETIA) 10 MG tablet Take 10 mg by mouth at bedtime. , Until Discontinued, Historical Med    metoprolol (LOPRESSOR) 50 MG tablet Take 50 mg by mouth at bedtime. , Until Discontinued, Historical Med    omeprazole (PRILOSEC) 20 MG capsule Take 20 mg by mouth daily. , Until Discontinued, Historical Med    QUEtiapine (SEROQUEL) 25 MG tablet Take 25 mg by mouth at bedtime. , Until Discontinued, Historical Med    ranitidine (ZANTAC) 150 MG tablet Take 150 mg by mouth 2 (two) times daily., Until Discontinued, Historical Med    saxagliptin HCl (ONGLYZA) 5 MG TABS tablet Take 5 mg by mouth every morning. , Until Discontinued, Historical Med    sertraline (ZOLOFT) 50 MG tablet Take 50 mg by mouth at bedtime. , Until Discontinued, Historical Med    traZODone (DESYREL) 150 MG  tablet Take 150 mg by mouth at bedtime. , Until Discontinued, Historical Med    clopidogrel (PLAVIX) 75 MG tablet Take 75 mg by mouth at bedtime. , Until Discontinued, Historical Med    Multiple Vitamin (MULTIVITAMIN WITH MINERALS) TABS Take 1 tablet by mouth daily., Until Discontinued, Historical Med    nitroGLYCERIN (NITROSTAT) 0.4 MG SL tablet Place 0.4 mg under the tongue every 5 (five) minutes as needed. For chest pain, Until Discontinued, Historical Med         Signed: Anastasio Auerbach. Kacy Conely   PAC  06/12/2012, 9:04 AM

## 2012-06-13 DIAGNOSIS — E119 Type 2 diabetes mellitus without complications: Secondary | ICD-10-CM | POA: Diagnosis not present

## 2012-06-13 DIAGNOSIS — IMO0001 Reserved for inherently not codable concepts without codable children: Secondary | ICD-10-CM | POA: Diagnosis not present

## 2012-06-13 DIAGNOSIS — M159 Polyosteoarthritis, unspecified: Secondary | ICD-10-CM | POA: Diagnosis not present

## 2012-06-13 DIAGNOSIS — I2510 Atherosclerotic heart disease of native coronary artery without angina pectoris: Secondary | ICD-10-CM | POA: Diagnosis not present

## 2012-06-13 DIAGNOSIS — Z96649 Presence of unspecified artificial hip joint: Secondary | ICD-10-CM | POA: Diagnosis not present

## 2012-06-13 DIAGNOSIS — I10 Essential (primary) hypertension: Secondary | ICD-10-CM | POA: Diagnosis not present

## 2012-06-14 DIAGNOSIS — M159 Polyosteoarthritis, unspecified: Secondary | ICD-10-CM | POA: Diagnosis not present

## 2012-06-14 DIAGNOSIS — I10 Essential (primary) hypertension: Secondary | ICD-10-CM | POA: Diagnosis not present

## 2012-06-14 DIAGNOSIS — E119 Type 2 diabetes mellitus without complications: Secondary | ICD-10-CM | POA: Diagnosis not present

## 2012-06-14 DIAGNOSIS — Z96649 Presence of unspecified artificial hip joint: Secondary | ICD-10-CM | POA: Diagnosis not present

## 2012-06-14 DIAGNOSIS — IMO0001 Reserved for inherently not codable concepts without codable children: Secondary | ICD-10-CM | POA: Diagnosis not present

## 2012-06-14 DIAGNOSIS — I2510 Atherosclerotic heart disease of native coronary artery without angina pectoris: Secondary | ICD-10-CM | POA: Diagnosis not present

## 2012-06-15 DIAGNOSIS — I2510 Atherosclerotic heart disease of native coronary artery without angina pectoris: Secondary | ICD-10-CM | POA: Diagnosis not present

## 2012-06-15 DIAGNOSIS — E119 Type 2 diabetes mellitus without complications: Secondary | ICD-10-CM | POA: Diagnosis not present

## 2012-06-15 DIAGNOSIS — Z96649 Presence of unspecified artificial hip joint: Secondary | ICD-10-CM | POA: Diagnosis not present

## 2012-06-15 DIAGNOSIS — M159 Polyosteoarthritis, unspecified: Secondary | ICD-10-CM | POA: Diagnosis not present

## 2012-06-15 DIAGNOSIS — I10 Essential (primary) hypertension: Secondary | ICD-10-CM | POA: Diagnosis not present

## 2012-06-15 DIAGNOSIS — IMO0001 Reserved for inherently not codable concepts without codable children: Secondary | ICD-10-CM | POA: Diagnosis not present

## 2012-06-17 DIAGNOSIS — Z96649 Presence of unspecified artificial hip joint: Secondary | ICD-10-CM | POA: Diagnosis not present

## 2012-06-17 DIAGNOSIS — I10 Essential (primary) hypertension: Secondary | ICD-10-CM | POA: Diagnosis not present

## 2012-06-17 DIAGNOSIS — E119 Type 2 diabetes mellitus without complications: Secondary | ICD-10-CM | POA: Diagnosis not present

## 2012-06-17 DIAGNOSIS — M159 Polyosteoarthritis, unspecified: Secondary | ICD-10-CM | POA: Diagnosis not present

## 2012-06-17 DIAGNOSIS — I2510 Atherosclerotic heart disease of native coronary artery without angina pectoris: Secondary | ICD-10-CM | POA: Diagnosis not present

## 2012-06-17 DIAGNOSIS — IMO0001 Reserved for inherently not codable concepts without codable children: Secondary | ICD-10-CM | POA: Diagnosis not present

## 2012-06-18 ENCOUNTER — Emergency Department (HOSPITAL_BASED_OUTPATIENT_CLINIC_OR_DEPARTMENT_OTHER)
Admission: EM | Admit: 2012-06-18 | Discharge: 2012-06-18 | Disposition: A | Discharge status: Home / Self Care | Payer: Medicare Other | Attending: Emergency Medicine | Admitting: Emergency Medicine

## 2012-06-18 ENCOUNTER — Emergency Department (HOSPITAL_BASED_OUTPATIENT_CLINIC_OR_DEPARTMENT_OTHER): Payer: Medicare Other

## 2012-06-18 ENCOUNTER — Encounter (HOSPITAL_BASED_OUTPATIENT_CLINIC_OR_DEPARTMENT_OTHER): Payer: Self-pay

## 2012-06-18 DIAGNOSIS — Z87448 Personal history of other diseases of urinary system: Secondary | ICD-10-CM | POA: Insufficient documentation

## 2012-06-18 DIAGNOSIS — I2510 Atherosclerotic heart disease of native coronary artery without angina pectoris: Secondary | ICD-10-CM | POA: Insufficient documentation

## 2012-06-18 DIAGNOSIS — Z79899 Other long term (current) drug therapy: Secondary | ICD-10-CM | POA: Diagnosis not present

## 2012-06-18 DIAGNOSIS — R0602 Shortness of breath: Secondary | ICD-10-CM | POA: Insufficient documentation

## 2012-06-18 DIAGNOSIS — I252 Old myocardial infarction: Secondary | ICD-10-CM | POA: Diagnosis not present

## 2012-06-18 DIAGNOSIS — Z7982 Long term (current) use of aspirin: Secondary | ICD-10-CM | POA: Insufficient documentation

## 2012-06-18 DIAGNOSIS — R05 Cough: Secondary | ICD-10-CM | POA: Diagnosis not present

## 2012-06-18 DIAGNOSIS — Z9989 Dependence on other enabling machines and devices: Secondary | ICD-10-CM | POA: Insufficient documentation

## 2012-06-18 DIAGNOSIS — E119 Type 2 diabetes mellitus without complications: Secondary | ICD-10-CM | POA: Insufficient documentation

## 2012-06-18 DIAGNOSIS — B9789 Other viral agents as the cause of diseases classified elsewhere: Secondary | ICD-10-CM | POA: Insufficient documentation

## 2012-06-18 DIAGNOSIS — J3489 Other specified disorders of nose and nasal sinuses: Secondary | ICD-10-CM | POA: Insufficient documentation

## 2012-06-18 DIAGNOSIS — G4730 Sleep apnea, unspecified: Secondary | ICD-10-CM | POA: Diagnosis not present

## 2012-06-18 DIAGNOSIS — Z7902 Long term (current) use of antithrombotics/antiplatelets: Secondary | ICD-10-CM | POA: Diagnosis not present

## 2012-06-18 DIAGNOSIS — R52 Pain, unspecified: Secondary | ICD-10-CM | POA: Diagnosis not present

## 2012-06-18 DIAGNOSIS — Z87891 Personal history of nicotine dependence: Secondary | ICD-10-CM | POA: Insufficient documentation

## 2012-06-18 DIAGNOSIS — I10 Essential (primary) hypertension: Secondary | ICD-10-CM | POA: Insufficient documentation

## 2012-06-18 DIAGNOSIS — K219 Gastro-esophageal reflux disease without esophagitis: Secondary | ICD-10-CM | POA: Insufficient documentation

## 2012-06-18 DIAGNOSIS — R0989 Other specified symptoms and signs involving the circulatory and respiratory systems: Secondary | ICD-10-CM | POA: Diagnosis not present

## 2012-06-18 DIAGNOSIS — E785 Hyperlipidemia, unspecified: Secondary | ICD-10-CM | POA: Insufficient documentation

## 2012-06-18 DIAGNOSIS — F4310 Post-traumatic stress disorder, unspecified: Secondary | ICD-10-CM | POA: Diagnosis not present

## 2012-06-18 DIAGNOSIS — Z8739 Personal history of other diseases of the musculoskeletal system and connective tissue: Secondary | ICD-10-CM | POA: Insufficient documentation

## 2012-06-18 DIAGNOSIS — B349 Viral infection, unspecified: Secondary | ICD-10-CM

## 2012-06-18 NOTE — ED Notes (Signed)
tegaderm removed from anterior aspect of right knee.  Pt is s/p knee replacement.  Incision appears CDI and edges well appoximated.  No drainage noted.  Non adherent dressing and abd pad applied.

## 2012-06-18 NOTE — ED Provider Notes (Addendum)
History     CSN: [id number]  Arrival date & time 06/18/12  4098   First MD Initiated Contact with Patient 06/18/12 [phone]      Chief Complaint  Patient presents with  . Cough  . Generalized Body Aches    (Consider location/radiation/quality/duration/timing/severity/associated sxs/prior treatment) HPI Comments: Patient presents with runny nose congestion and coughing for the last 3-4 days. He's also had some body aches associated with that. He states his cough is only mildly productive. He denies any chest pain or pleuritic-type pain. He has some mild shortness of breath occasionally on exertion. He feels that this is partly attributed to his nasal congestion. He had recent right knee replacement. He states the swelling has been improving since the surgery and denies any new swelling to his leg or calf or thigh pain. He denies any history of DVT.  Patient is a 62 y.o. male presenting with cough.  Cough Associated symptoms include rhinorrhea and shortness of breath (mild). Pertinent negatives include no chest pain, no chills and no headaches.    Past Medical History  Diagnosis Date  . Mental disorder     PTSD  . Hypertension   . Sleep apnea     uses cpap nightly  . Coronary artery disease     stents x4-last intervention 2009  . Hyperlipidemia   . BPH (benign prostatic hyperplasia)   . Myocardial infarction     stent placed-2009 adn 2006 hx of mi x 3   . Diabetes mellitus     type 2 on pills   . GERD (gastroesophageal reflux disease)   . Arthritis     generalized   . Complication of anesthesia     HEART ATTACK IN THE RECOVERY ROOM AFTER NECK SURGERY    Past Surgical History  Procedure Date  . Cardiac catheterization     4/11 ,PTCA w/stent '03,'09  . Cervical discectomy 4/11  . Arthroscopy rt shoulder-2007  . Knee arthroscopy 1970's  . Coronary angioplasty     stents in 2006 and 2009   . Right shoulder subacromial decompression  05/23/11   . Knee arthroscopy with  lateral menisectomy 06/10/2012    Procedure: KNEE ARTHROSCOPY WITH LATERAL MENISECTOMY;  Surgeon: Shelda Pal, MD;  Location: [facility];  Service: Orthopedics;  Laterality: Right;  . Partial knee arthroplasty 06/10/2012    Procedure: UNICOMPARTMENTAL KNEE;  Surgeon: Shelda Pal, MD;  Location: [facility];  Service: Orthopedics;  Laterality: Right;  Right Medial Uni Knee    No family history on file.  History  Substance Use Topics  . Smoking status: Former Smoker    Quit date: 09/13/2007  . Smokeless tobacco: Never Used  . Alcohol Use: Yes     Comment: rare      Review of Systems  Constitutional: Positive for fatigue. Negative for fever, chills and diaphoresis.  HENT: Positive for congestion, rhinorrhea and postnasal drip. Negative for sneezing.   Eyes: Negative.   Respiratory: Positive for cough and shortness of breath (mild). Negative for chest tightness.   Cardiovascular: Positive for leg swelling (improving since surgery). Negative for chest pain.  Gastrointestinal: Negative for nausea, vomiting, abdominal pain, diarrhea and blood in stool.  Genitourinary: Negative for frequency, hematuria, flank pain and difficulty urinating.  Musculoskeletal: Positive for joint swelling (right knee since surgery). Negative for back pain and arthralgias.  Skin: Negative for rash.  Neurological: Negative for dizziness, speech difficulty, weakness, numbness and headaches.    Allergies  Metformin and related and Niacin  and related  Home Medications   Current Outpatient Rx  Name  Route  Sig  Dispense  Refill  . AMLODIPINE BESYLATE 10 MG PO TABS   Oral   Take 10 mg by mouth at bedtime.          . ASPIRIN 81 MG PO TABS   Oral   Take 81 mg by mouth at bedtime. Instructed to stay on per [name],and [name]         . ATORVASTATIN CALCIUM 40 MG PO TABS   Oral   Take 40 mg by mouth at bedtime.         . CLOPIDOGREL BISULFATE 75 MG PO TABS   Oral   Take 75 mg by mouth at bedtime.            Marland Kitchen DIPHENHYDRAMINE HCL 25 MG PO CAPS   Oral   Take 1 capsule (25 mg total) by mouth every 6 (six) hours as needed for itching, allergies or sleep.   30 capsule      . EZETIMIBE 10 MG PO TABS   Oral   Take 10 mg by mouth at bedtime.          Marland Kitchen FERROUS SULFATE 325 (65 FE) MG PO TABS   Oral   Take 1 tablet (325 mg total) by mouth 3 (three) times daily after meals.         Marland Kitchen HYDROCODONE-ACETAMINOPHEN 7.5-325 MG PO TABS   Oral   Take 1-2 tablets by mouth every 4 (four) hours as needed for pain.   120 tablet   0   . METHOCARBAMOL 500 MG PO TABS   Oral   Take 1 tablet (500 mg total) by mouth every 6 (six) hours as needed (muscle spasms).         . METOPROLOL TARTRATE 50 MG PO TABS   Oral   Take 50 mg by mouth at bedtime.          . ADULT MULTIVITAMIN W/MINERALS CH   Oral   Take 1 tablet by mouth daily.         Marland Kitchen NITROGLYCERIN 0.4 MG SL SUBL   Sublingual   Place 0.4 mg under the tongue every 5 (five) minutes as needed. For chest pain         . OMEPRAZOLE 20 MG PO CPDR   Oral   Take 20 mg by mouth daily.          Marland Kitchen POLYETHYLENE GLYCOL 3350 PO PACK   Oral   Take 17 g by mouth 2 (two) times daily.   14 each      . QUETIAPINE FUMARATE 25 MG PO TABS   Oral   Take 25 mg by mouth at bedtime.          Marland Kitchen RANITIDINE HCL 150 MG PO TABS   Oral   Take 150 mg by mouth 2 (two) times daily.         Marland Kitchen SAXAGLIPTIN HCL 5 MG PO TABS   Oral   Take 5 mg by mouth every morning.          Marland Kitchen SERTRALINE HCL 50 MG PO TABS   Oral   Take 50 mg by mouth at bedtime.          . TRAZODONE HCL 150 MG PO TABS   Oral   Take 150 mg by mouth at bedtime.            BP 155/90  Pulse  61  Temp 98 F (36.7 C) (Oral)  Resp 18  SpO2 98%  Physical Exam  Constitutional: He is oriented to person, place, and time. He appears well-developed and well-nourished.  HENT:  Head: Normocephalic and atraumatic.  Mouth/Throat: Oropharynx is clear and moist.  Eyes:  Pupils are equal, round, and reactive to light.  Neck: Normal range of motion. Neck supple.  Cardiovascular: Normal rate, regular rhythm and normal heart sounds.   Pulmonary/Chest: Effort normal and breath sounds normal. No respiratory distress. He has no wheezes. He has no rales. He exhibits no tenderness.  Abdominal: Soft. Bowel sounds are normal. There is no tenderness. There is no rebound and no guarding.  Musculoskeletal: Normal range of motion. He exhibits no edema.       Mild swelling around right knee, dressing in place.  No significant swelling to lower leg.  No pain to calf/thigh, negative Homans sign  Lymphadenopathy:    He has no cervical adenopathy.  Neurological: He is alert and oriented to person, place, and time.  Skin: Skin is warm and dry. No rash noted.  Psychiatric: He has a normal mood and affect.    ED Course  Procedures (including critical care time)   Dg Chest 2 View  06/18/2012  *RADIOLOGY REPORT*  Clinical Data: Cough, congestion.  CHEST - 2 VIEW  Comparison: 06/03/2012  Findings: Heart and mediastinal contours are within normal limits. No focal opacities or effusions.  No acute bony abnormality.  IMPRESSION: No active cardiopulmonary disease.   Original Report Authenticated By: Charlett Nose, M.D.    D   1. Viral syndrome       MDM  Patient with upper respiratory symptoms and flulike illness that started 3-4 days ago. He's out of the window for Tamiflu. He is well-appearing with normal oxygen saturations. He has no respiratory difficulties currently. Wells criteria shows low probability for VTE. He has no other symptoms suggestive of pulmonary embolus or DVT. He is no evidence of pneumonia. He's currently taking hydrocodone at home so I did not give him any further cough medicine. He's going to continue the Mucinex at home. I advised him to followup with his primary care physician within the next 2-3 days if his symptoms are not improving or return here if he  has any worsening symptoms or shortness of breath. I also advised him of signs and symptoms of DVT/PE and to return for any of these symptoms.        Rolan Bucco, MD 06/18/12 1021  Rolan Bucco, MD 06/18/12 1021

## 2012-06-18 NOTE — ED Notes (Signed)
Patient transported to X-ray 

## 2012-06-18 NOTE — ED Notes (Signed)
Pt reports cough and generalized body aches x 3-4 days.

## 2012-06-19 DIAGNOSIS — E119 Type 2 diabetes mellitus without complications: Secondary | ICD-10-CM | POA: Diagnosis not present

## 2012-06-19 DIAGNOSIS — IMO0001 Reserved for inherently not codable concepts without codable children: Secondary | ICD-10-CM | POA: Diagnosis not present

## 2012-06-19 DIAGNOSIS — I2510 Atherosclerotic heart disease of native coronary artery without angina pectoris: Secondary | ICD-10-CM | POA: Diagnosis not present

## 2012-06-19 DIAGNOSIS — I10 Essential (primary) hypertension: Secondary | ICD-10-CM | POA: Diagnosis not present

## 2012-06-19 DIAGNOSIS — M159 Polyosteoarthritis, unspecified: Secondary | ICD-10-CM | POA: Diagnosis not present

## 2012-06-19 DIAGNOSIS — Z96649 Presence of unspecified artificial hip joint: Secondary | ICD-10-CM | POA: Diagnosis not present

## 2012-06-21 DIAGNOSIS — Z96649 Presence of unspecified artificial hip joint: Secondary | ICD-10-CM | POA: Diagnosis not present

## 2012-06-21 DIAGNOSIS — E119 Type 2 diabetes mellitus without complications: Secondary | ICD-10-CM | POA: Diagnosis not present

## 2012-06-21 DIAGNOSIS — M159 Polyosteoarthritis, unspecified: Secondary | ICD-10-CM | POA: Diagnosis not present

## 2012-06-21 DIAGNOSIS — IMO0001 Reserved for inherently not codable concepts without codable children: Secondary | ICD-10-CM | POA: Diagnosis not present

## 2012-06-21 DIAGNOSIS — I2510 Atherosclerotic heart disease of native coronary artery without angina pectoris: Secondary | ICD-10-CM | POA: Diagnosis not present

## 2012-06-21 DIAGNOSIS — I10 Essential (primary) hypertension: Secondary | ICD-10-CM | POA: Diagnosis not present

## 2012-06-25 DIAGNOSIS — M159 Polyosteoarthritis, unspecified: Secondary | ICD-10-CM | POA: Diagnosis not present

## 2012-06-25 DIAGNOSIS — IMO0001 Reserved for inherently not codable concepts without codable children: Secondary | ICD-10-CM | POA: Diagnosis not present

## 2012-06-25 DIAGNOSIS — E119 Type 2 diabetes mellitus without complications: Secondary | ICD-10-CM | POA: Diagnosis not present

## 2012-06-25 DIAGNOSIS — Z96649 Presence of unspecified artificial hip joint: Secondary | ICD-10-CM | POA: Diagnosis not present

## 2012-06-25 DIAGNOSIS — I2510 Atherosclerotic heart disease of native coronary artery without angina pectoris: Secondary | ICD-10-CM | POA: Diagnosis not present

## 2012-06-25 DIAGNOSIS — I10 Essential (primary) hypertension: Secondary | ICD-10-CM | POA: Diagnosis not present

## 2012-06-26 DIAGNOSIS — I2510 Atherosclerotic heart disease of native coronary artery without angina pectoris: Secondary | ICD-10-CM | POA: Diagnosis not present

## 2012-06-26 DIAGNOSIS — IMO0001 Reserved for inherently not codable concepts without codable children: Secondary | ICD-10-CM | POA: Diagnosis not present

## 2012-06-26 DIAGNOSIS — Z96649 Presence of unspecified artificial hip joint: Secondary | ICD-10-CM | POA: Diagnosis not present

## 2012-06-26 DIAGNOSIS — I10 Essential (primary) hypertension: Secondary | ICD-10-CM | POA: Diagnosis not present

## 2012-06-26 DIAGNOSIS — M159 Polyosteoarthritis, unspecified: Secondary | ICD-10-CM | POA: Diagnosis not present

## 2012-06-26 DIAGNOSIS — E119 Type 2 diabetes mellitus without complications: Secondary | ICD-10-CM | POA: Diagnosis not present

## 2012-06-28 DIAGNOSIS — I2510 Atherosclerotic heart disease of native coronary artery without angina pectoris: Secondary | ICD-10-CM | POA: Diagnosis not present

## 2012-06-28 DIAGNOSIS — M159 Polyosteoarthritis, unspecified: Secondary | ICD-10-CM | POA: Diagnosis not present

## 2012-06-28 DIAGNOSIS — Z96649 Presence of unspecified artificial hip joint: Secondary | ICD-10-CM | POA: Diagnosis not present

## 2012-06-28 DIAGNOSIS — E119 Type 2 diabetes mellitus without complications: Secondary | ICD-10-CM | POA: Diagnosis not present

## 2012-06-28 DIAGNOSIS — I10 Essential (primary) hypertension: Secondary | ICD-10-CM | POA: Diagnosis not present

## 2012-06-28 DIAGNOSIS — IMO0001 Reserved for inherently not codable concepts without codable children: Secondary | ICD-10-CM | POA: Diagnosis not present

## 2012-07-02 DIAGNOSIS — M25569 Pain in unspecified knee: Secondary | ICD-10-CM | POA: Diagnosis not present

## 2012-07-04 DIAGNOSIS — M25569 Pain in unspecified knee: Secondary | ICD-10-CM | POA: Diagnosis not present

## 2012-07-25 DIAGNOSIS — M25569 Pain in unspecified knee: Secondary | ICD-10-CM | POA: Diagnosis not present

## 2012-07-26 DIAGNOSIS — Z96659 Presence of unspecified artificial knee joint: Secondary | ICD-10-CM | POA: Diagnosis not present

## 2012-07-30 DIAGNOSIS — I10 Essential (primary) hypertension: Secondary | ICD-10-CM | POA: Diagnosis not present

## 2012-07-30 DIAGNOSIS — I2510 Atherosclerotic heart disease of native coronary artery without angina pectoris: Secondary | ICD-10-CM | POA: Diagnosis not present

## 2012-08-20 DIAGNOSIS — I2510 Atherosclerotic heart disease of native coronary artery without angina pectoris: Secondary | ICD-10-CM | POA: Diagnosis not present

## 2012-08-20 DIAGNOSIS — I10 Essential (primary) hypertension: Secondary | ICD-10-CM | POA: Diagnosis not present

## 2012-09-06 DIAGNOSIS — Z96659 Presence of unspecified artificial knee joint: Secondary | ICD-10-CM | POA: Diagnosis not present

## 2012-09-11 DIAGNOSIS — I10 Essential (primary) hypertension: Secondary | ICD-10-CM | POA: Diagnosis not present

## 2012-09-11 DIAGNOSIS — E7800 Pure hypercholesterolemia, unspecified: Secondary | ICD-10-CM | POA: Diagnosis not present

## 2012-09-11 DIAGNOSIS — G4733 Obstructive sleep apnea (adult) (pediatric): Secondary | ICD-10-CM | POA: Diagnosis not present

## 2012-09-11 DIAGNOSIS — I2510 Atherosclerotic heart disease of native coronary artery without angina pectoris: Secondary | ICD-10-CM | POA: Diagnosis not present

## 2012-09-11 DIAGNOSIS — E291 Testicular hypofunction: Secondary | ICD-10-CM | POA: Diagnosis not present

## 2012-09-11 DIAGNOSIS — E1169 Type 2 diabetes mellitus with other specified complication: Secondary | ICD-10-CM | POA: Diagnosis not present

## 2012-09-11 DIAGNOSIS — Z0000 Encounter for general adult medical examination without abnormal findings: Secondary | ICD-10-CM | POA: Diagnosis not present

## 2012-09-11 DIAGNOSIS — Z125 Encounter for screening for malignant neoplasm of prostate: Secondary | ICD-10-CM | POA: Diagnosis not present

## 2012-09-11 DIAGNOSIS — F411 Generalized anxiety disorder: Secondary | ICD-10-CM | POA: Diagnosis not present

## 2012-10-07 DIAGNOSIS — R351 Nocturia: Secondary | ICD-10-CM | POA: Diagnosis not present

## 2012-10-07 DIAGNOSIS — N400 Enlarged prostate without lower urinary tract symptoms: Secondary | ICD-10-CM | POA: Diagnosis not present

## 2012-10-07 DIAGNOSIS — R3915 Urgency of urination: Secondary | ICD-10-CM | POA: Diagnosis not present

## 2012-10-18 DIAGNOSIS — E1159 Type 2 diabetes mellitus with other circulatory complications: Secondary | ICD-10-CM | POA: Diagnosis not present

## 2012-10-18 DIAGNOSIS — I10 Essential (primary) hypertension: Secondary | ICD-10-CM | POA: Diagnosis not present

## 2012-10-30 DIAGNOSIS — M5430 Sciatica, unspecified side: Secondary | ICD-10-CM | POA: Diagnosis not present

## 2012-11-18 DIAGNOSIS — J069 Acute upper respiratory infection, unspecified: Secondary | ICD-10-CM | POA: Diagnosis not present

## 2012-12-02 DIAGNOSIS — E7800 Pure hypercholesterolemia, unspecified: Secondary | ICD-10-CM | POA: Diagnosis not present

## 2012-12-02 DIAGNOSIS — G4733 Obstructive sleep apnea (adult) (pediatric): Secondary | ICD-10-CM | POA: Diagnosis not present

## 2012-12-02 DIAGNOSIS — I10 Essential (primary) hypertension: Secondary | ICD-10-CM | POA: Diagnosis not present

## 2012-12-02 DIAGNOSIS — I2510 Atherosclerotic heart disease of native coronary artery without angina pectoris: Secondary | ICD-10-CM | POA: Diagnosis not present

## 2013-03-13 DIAGNOSIS — E1169 Type 2 diabetes mellitus with other specified complication: Secondary | ICD-10-CM | POA: Diagnosis not present

## 2013-03-13 DIAGNOSIS — E7800 Pure hypercholesterolemia, unspecified: Secondary | ICD-10-CM | POA: Diagnosis not present

## 2013-03-13 DIAGNOSIS — I2510 Atherosclerotic heart disease of native coronary artery without angina pectoris: Secondary | ICD-10-CM | POA: Diagnosis not present

## 2013-03-13 DIAGNOSIS — I10 Essential (primary) hypertension: Secondary | ICD-10-CM | POA: Diagnosis not present

## 2013-04-09 DIAGNOSIS — L821 Other seborrheic keratosis: Secondary | ICD-10-CM | POA: Diagnosis not present

## 2013-04-09 DIAGNOSIS — L570 Actinic keratosis: Secondary | ICD-10-CM | POA: Diagnosis not present

## 2013-04-21 DIAGNOSIS — L570 Actinic keratosis: Secondary | ICD-10-CM | POA: Diagnosis not present

## 2013-05-13 DIAGNOSIS — L570 Actinic keratosis: Secondary | ICD-10-CM | POA: Diagnosis not present

## 2013-05-13 DIAGNOSIS — L821 Other seborrheic keratosis: Secondary | ICD-10-CM | POA: Diagnosis not present

## 2013-05-13 DIAGNOSIS — Z808 Family history of malignant neoplasm of other organs or systems: Secondary | ICD-10-CM | POA: Diagnosis not present

## 2013-05-13 DIAGNOSIS — D239 Other benign neoplasm of skin, unspecified: Secondary | ICD-10-CM | POA: Diagnosis not present

## 2013-05-22 DIAGNOSIS — J069 Acute upper respiratory infection, unspecified: Secondary | ICD-10-CM | POA: Diagnosis not present

## 2013-06-03 ENCOUNTER — Encounter: Payer: Self-pay | Admitting: General Surgery

## 2013-06-03 DIAGNOSIS — G4733 Obstructive sleep apnea (adult) (pediatric): Secondary | ICD-10-CM | POA: Insufficient documentation

## 2013-06-03 DIAGNOSIS — I2510 Atherosclerotic heart disease of native coronary artery without angina pectoris: Secondary | ICD-10-CM

## 2013-06-03 DIAGNOSIS — E7800 Pure hypercholesterolemia, unspecified: Secondary | ICD-10-CM | POA: Insufficient documentation

## 2013-06-03 DIAGNOSIS — I10 Essential (primary) hypertension: Secondary | ICD-10-CM | POA: Insufficient documentation

## 2013-06-11 DIAGNOSIS — I951 Orthostatic hypotension: Secondary | ICD-10-CM | POA: Diagnosis not present

## 2013-06-11 DIAGNOSIS — I10 Essential (primary) hypertension: Secondary | ICD-10-CM | POA: Diagnosis not present

## 2013-06-20 ENCOUNTER — Ambulatory Visit (INDEPENDENT_AMBULATORY_CARE_PROVIDER_SITE_OTHER): Payer: Medicare Other | Admitting: Cardiology

## 2013-06-20 ENCOUNTER — Encounter: Payer: Self-pay | Admitting: Cardiology

## 2013-06-20 VITALS — BP 120/78 | HR 61 | Ht 70.0 in | Wt 235.4 lb

## 2013-06-20 DIAGNOSIS — G4733 Obstructive sleep apnea (adult) (pediatric): Secondary | ICD-10-CM | POA: Diagnosis not present

## 2013-06-20 DIAGNOSIS — E7800 Pure hypercholesterolemia, unspecified: Secondary | ICD-10-CM

## 2013-06-20 DIAGNOSIS — I2510 Atherosclerotic heart disease of native coronary artery without angina pectoris: Secondary | ICD-10-CM

## 2013-06-20 DIAGNOSIS — I10 Essential (primary) hypertension: Secondary | ICD-10-CM | POA: Diagnosis not present

## 2013-06-20 DIAGNOSIS — E669 Obesity, unspecified: Secondary | ICD-10-CM

## 2013-06-20 DIAGNOSIS — R011 Cardiac murmur, unspecified: Secondary | ICD-10-CM

## 2013-06-20 DIAGNOSIS — R42 Dizziness and giddiness: Secondary | ICD-10-CM

## 2013-06-20 NOTE — Progress Notes (Signed)
[place], [address] [place], [place]  [address] Phone: [phone] Fax:  [phone]  Date:  06/20/2013   ID:  Seth Washington, DOB February 08, 1951, MRN [id number]  PCP:  Osborne Casco, MD  Cardiologist:  Fransico Him, MD     History of Present Illness: Seth Washington is a 63 y.o. male with a history of ASCAD, dyslipidemia, OSA on CPAP, obesity and HTN who presents today for followup.  He is doing well.  He denies any chest pain, LE edema,  palpitations or syncope.  He tolerates his CPAP well.  He tolerates the full face mask and feels the pressure is adequate. He says that his wife says he is He will feel sleep during the day but does not have to take a nap.  He also complains of dizzy spells which he describes as vertigo.  Whenever he lays down the room will spin and when he sits up in the am he gets dizzy.  He had chronic DOE when going up stairs that is unchanged.  He has not been getting any aerobic exercise and has gained weight.   Wt Readings from Last 3 Encounters:  06/20/13 235 lb 6.4 oz (106.777 kg)  06/03/13 235 lb (106.595 kg)  06/10/12 230 lb (104.327 kg)     Past Medical History  Diagnosis Date  . Mental disorder     PTSD  . Hypertension   . Sleep apnea     uses cpap nightly  . Hyperlipidemia   . BPH (benign prostatic hyperplasia)   . Myocardial infarction     stent placed-2009 adn 2006 hx of mi x 3   . GERD (gastroesophageal reflux disease)   . Arthritis     generalized   . Complication of anesthesia     HEART ATTACK IN THE RECOVERY ROOM AFTER NECK SURGERY  . Diabetes mellitus     type 2 on pills   . Coronary artery disease     stents x4-last intervention 2009  . Anxiety   . OSA (obstructive sleep apnea)   . Glaucoma   . PUD (peptic ulcer disease)   . PTSD (post-traumatic stress disorder)   . Obesity   . Aortic valve sclerosis     Mild no stenosis    Current Outpatient Prescriptions  Medication Sig Dispense Refill  . aspirin 81 MG tablet  Take 81 mg by mouth at bedtime. Instructed to stay on per [name],and Dr [name]      . atorvastatin (LIPITOR) 40 MG tablet Take 40 mg by mouth at bedtime.      . clopidogrel (PLAVIX) 75 MG tablet Take 75 mg by mouth at bedtime.       . diphenhydrAMINE (BENADRYL) 25 mg capsule Take 1 capsule (25 mg total) by mouth every 6 (six) hours as needed for itching, allergies or sleep.  30 capsule    . ezetimibe (ZETIA) 10 MG tablet Take 10 mg by mouth at bedtime.       . ferrous sulfate 325 (65 FE) MG tablet Take 1 tablet (325 mg total) by mouth 3 (three) times daily after meals.      Marland Kitchen HYDROcodone-acetaminophen (NORCO) 7.5-325 MG per tablet Take 1-2 tablets by mouth every 4 (four) hours as needed for pain.  120 tablet  0  . metoprolol (LOPRESSOR) 50 MG tablet Take 100 mg by mouth at bedtime.       Marland Kitchen omeprazole (PRILOSEC) 20 MG capsule Take 20 mg by mouth daily.       Marland Kitchen  polyethylene glycol (MIRALAX / GLYCOLAX) packet Take 17 g by mouth 2 (two) times daily.  14 each    . QUEtiapine (SEROQUEL) 25 MG tablet Take 25 mg by mouth at bedtime.       . ranitidine (ZANTAC) 150 MG tablet Take 150 mg by mouth 2 (two) times daily.      . saxagliptin HCl (ONGLYZA) 5 MG TABS tablet Take 5 mg by mouth every morning.       . sertraline (ZOLOFT) 50 MG tablet Take 50 mg by mouth at bedtime.       . traZODone (DESYREL) 150 MG tablet Take 150 mg by mouth at bedtime.        No current facility-administered medications for this visit.    Allergies:    Allergies  Allergen Reactions  . Ace Inhibitors Cough  . Metformin And Related Diarrhea    Severe diarrhea  . Niacin And Related     Face flushed    Social History:  The patient  reports that he quit smoking about 5 years ago. He has never used smokeless tobacco. He reports that he drinks alcohol. He reports that he does not use illicit drugs.   Family History:  The patient's family history is not on file.   ROS:  Please see the history of present illness.       All other systems reviewed and negative.   PHYSICAL EXAM: VS:  BP 120/78  Pulse 61  Ht 5\' 10"  (1.778 m)  Wt 235 lb 6.4 oz (106.777 kg)  BMI 33.78 kg/m2 Well nourished, well developed, in no acute distress HEENT: normal Neck: no JVD Cardiac:  normal S1, S2; RRR; no murmur Lungs:  clear to auscultation bilaterally, no wheezing, rhonchi or rales Abd: soft, nontender, no hepatomegaly Ext: no edema Skin: warm and dry Neuro:  CNs 2-12 intact, no focal abnormalities noted      ASSESSMENT AND PLAN:  1. ASCAD with no angina  - continue ASA/Plavix 2. HTN - well controlled  - continue metoprolol 3. Dyslipidemia - LDL 50 on lipid check 10/14  - continue Atorvastatin/Zetia 4. OSA on CPAP and tolerating well but has some non restorative sleep and daytime sleepiness  - I will get a download from his DME to make sure that his settings are adequate since he has gained some weight 5. Obesity  - I have encouraged him to get back into a routine exercise program 6.  Vertigo  - I will refer him to ENT 7.  Heart murmur  - 2D echo to assess  Followup with me in 6 months  Signed, Fransico Him, MD 06/20/2013 11:13 AM

## 2013-06-20 NOTE — Patient Instructions (Signed)
Your physician has requested that you have an echocardiogram. Echocardiography is a painless test that uses sound waves to create images of your heart. It provides your doctor with information about the size and shape of your heart and how well your heart's chambers and valves are working. This procedure takes approximately one hour. There are no restrictions for this procedure.  You have been referred to Ear Nose and throat  Your physician wants you to follow-up in: 6 months with Dr. Mallie Snooks will receive a reminder letter in the mail two months in advance. If you don't receive a letter, please call our office to schedule the follow-up appointment.

## 2013-06-27 DIAGNOSIS — H8110 Benign paroxysmal vertigo, unspecified ear: Secondary | ICD-10-CM | POA: Diagnosis not present

## 2013-06-27 DIAGNOSIS — R361 Hematospermia: Secondary | ICD-10-CM | POA: Diagnosis not present

## 2013-07-16 ENCOUNTER — Other Ambulatory Visit (HOSPITAL_COMMUNITY): Payer: Medicare Other

## 2013-07-21 ENCOUNTER — Other Ambulatory Visit (HOSPITAL_COMMUNITY): Payer: Medicare Other

## 2013-07-22 ENCOUNTER — Other Ambulatory Visit (HOSPITAL_COMMUNITY): Payer: Medicare Other

## 2013-07-23 ENCOUNTER — Emergency Department (HOSPITAL_COMMUNITY): Payer: Medicare Other

## 2013-07-23 ENCOUNTER — Encounter (HOSPITAL_COMMUNITY): Payer: Self-pay | Admitting: Emergency Medicine

## 2013-07-23 ENCOUNTER — Emergency Department (HOSPITAL_COMMUNITY)
Admission: EM | Admit: 2013-07-23 | Discharge: 2013-07-23 | Disposition: A | Discharge status: Home / Self Care | Payer: Medicare Other | Attending: Emergency Medicine | Admitting: Emergency Medicine

## 2013-07-23 DIAGNOSIS — Z7902 Long term (current) use of antithrombotics/antiplatelets: Secondary | ICD-10-CM | POA: Insufficient documentation

## 2013-07-23 DIAGNOSIS — Z9981 Dependence on supplemental oxygen: Secondary | ICD-10-CM | POA: Insufficient documentation

## 2013-07-23 DIAGNOSIS — H409 Unspecified glaucoma: Secondary | ICD-10-CM | POA: Diagnosis not present

## 2013-07-23 DIAGNOSIS — Z9889 Other specified postprocedural states: Secondary | ICD-10-CM | POA: Insufficient documentation

## 2013-07-23 DIAGNOSIS — Z87448 Personal history of other diseases of urinary system: Secondary | ICD-10-CM | POA: Insufficient documentation

## 2013-07-23 DIAGNOSIS — G4730 Sleep apnea, unspecified: Secondary | ICD-10-CM | POA: Insufficient documentation

## 2013-07-23 DIAGNOSIS — F411 Generalized anxiety disorder: Secondary | ICD-10-CM | POA: Diagnosis not present

## 2013-07-23 DIAGNOSIS — N201 Calculus of ureter: Secondary | ICD-10-CM | POA: Diagnosis not present

## 2013-07-23 DIAGNOSIS — I10 Essential (primary) hypertension: Secondary | ICD-10-CM | POA: Insufficient documentation

## 2013-07-23 DIAGNOSIS — Z8711 Personal history of peptic ulcer disease: Secondary | ICD-10-CM | POA: Diagnosis not present

## 2013-07-23 DIAGNOSIS — I252 Old myocardial infarction: Secondary | ICD-10-CM | POA: Diagnosis not present

## 2013-07-23 DIAGNOSIS — Z79899 Other long term (current) drug therapy: Secondary | ICD-10-CM | POA: Insufficient documentation

## 2013-07-23 DIAGNOSIS — R1031 Right lower quadrant pain: Secondary | ICD-10-CM | POA: Diagnosis not present

## 2013-07-23 DIAGNOSIS — N200 Calculus of kidney: Secondary | ICD-10-CM | POA: Diagnosis not present

## 2013-07-23 DIAGNOSIS — Z87891 Personal history of nicotine dependence: Secondary | ICD-10-CM | POA: Diagnosis not present

## 2013-07-23 DIAGNOSIS — E119 Type 2 diabetes mellitus without complications: Secondary | ICD-10-CM | POA: Insufficient documentation

## 2013-07-23 DIAGNOSIS — F4310 Post-traumatic stress disorder, unspecified: Secondary | ICD-10-CM | POA: Insufficient documentation

## 2013-07-23 DIAGNOSIS — K219 Gastro-esophageal reflux disease without esophagitis: Secondary | ICD-10-CM | POA: Insufficient documentation

## 2013-07-23 DIAGNOSIS — E785 Hyperlipidemia, unspecified: Secondary | ICD-10-CM | POA: Insufficient documentation

## 2013-07-23 DIAGNOSIS — G4733 Obstructive sleep apnea (adult) (pediatric): Secondary | ICD-10-CM | POA: Insufficient documentation

## 2013-07-23 DIAGNOSIS — I2510 Atherosclerotic heart disease of native coronary artery without angina pectoris: Secondary | ICD-10-CM | POA: Insufficient documentation

## 2013-07-23 DIAGNOSIS — M129 Arthropathy, unspecified: Secondary | ICD-10-CM | POA: Insufficient documentation

## 2013-07-23 DIAGNOSIS — R61 Generalized hyperhidrosis: Secondary | ICD-10-CM | POA: Insufficient documentation

## 2013-07-23 DIAGNOSIS — E669 Obesity, unspecified: Secondary | ICD-10-CM | POA: Diagnosis not present

## 2013-07-23 DIAGNOSIS — Z9861 Coronary angioplasty status: Secondary | ICD-10-CM | POA: Insufficient documentation

## 2013-07-23 DIAGNOSIS — Z7982 Long term (current) use of aspirin: Secondary | ICD-10-CM | POA: Insufficient documentation

## 2013-07-23 LAB — CBC WITH DIFFERENTIAL/PLATELET
BASOS PCT: 1 % (ref 0–1)
Basophils Absolute: 0.1 10*3/uL (ref 0.0–0.1)
EOS ABS: 0.1 10*3/uL (ref 0.0–0.7)
Eosinophils Relative: 1 % (ref 0–5)
HCT: 41.6 % (ref 39.0–52.0)
HEMOGLOBIN: 15.1 g/dL (ref 13.0–17.0)
Lymphocytes Relative: 35 % (ref 12–46)
Lymphs Abs: 3.3 10*3/uL (ref 0.7–4.0)
MCH: 31.6 pg (ref 26.0–34.0)
MCHC: 36.3 g/dL — AB (ref 30.0–36.0)
MCV: 87 fL (ref 78.0–100.0)
MONOS PCT: 9 % (ref 3–12)
Monocytes Absolute: 0.8 10*3/uL (ref 0.1–1.0)
NEUTROS PCT: 54 % (ref 43–77)
Neutro Abs: 5.1 10*3/uL (ref 1.7–7.7)
Platelets: 196 10*3/uL (ref 150–400)
RBC: 4.78 MIL/uL (ref 4.22–5.81)
RDW: 12 % (ref 11.5–15.5)
WBC: 9.4 10*3/uL (ref 4.0–10.5)

## 2013-07-23 LAB — COMPREHENSIVE METABOLIC PANEL
ALBUMIN: 4.4 g/dL (ref 3.5–5.2)
ALK PHOS: 60 U/L (ref 39–117)
ALT: 35 U/L (ref 0–53)
AST: 28 U/L (ref 0–37)
BUN: 14 mg/dL (ref 6–23)
CALCIUM: 9.4 mg/dL (ref 8.4–10.5)
CO2: 21 mEq/L (ref 19–32)
CREATININE: 1.04 mg/dL (ref 0.50–1.35)
Chloride: 98 mEq/L (ref 96–112)
GFR calc Af Amer: 88 mL/min — ABNORMAL LOW (ref >90)
GFR calc non Af Amer: 76 mL/min — ABNORMAL LOW (ref >90)
Glucose, Bld: 297 mg/dL — ABNORMAL HIGH (ref 70–99)
POTASSIUM: 4.7 meq/L (ref 3.7–5.3)
Sodium: 135 mEq/L — ABNORMAL LOW (ref 137–147)
TOTAL PROTEIN: 7.6 g/dL (ref 6.0–8.3)
Total Bilirubin: 0.7 mg/dL (ref 0.3–1.2)

## 2013-07-23 LAB — URINALYSIS, ROUTINE W REFLEX MICROSCOPIC
Bilirubin Urine: NEGATIVE
Glucose, UA: >1000 mg/dL — AB
Ketones, ur: NEGATIVE mg/dL
LEUKOCYTES UA: NEGATIVE
NITRITE: NEGATIVE
PH: 7.5 (ref 5.0–8.0)
Protein, ur: NEGATIVE mg/dL
SPECIFIC GRAVITY, URINE: 1.019 (ref 1.005–1.030)
Urobilinogen, UA: 0.2 mg/dL (ref 0.0–1.0)

## 2013-07-23 LAB — CG4 I-STAT (LACTIC ACID): Lactic Acid, Venous: 3.08 mmol/L — ABNORMAL HIGH (ref 0.5–2.2)

## 2013-07-23 LAB — LIPASE, BLOOD: LIPASE: 44 U/L (ref 11–59)

## 2013-07-23 LAB — URINE MICROSCOPIC-ADD ON

## 2013-07-23 MED ORDER — ONDANSETRON 4 MG PO TBDP: 4.0000 mg | ORAL_TABLET | Freq: Three times a day (TID) | ORAL | Status: DC | PRN

## 2013-07-23 MED ORDER — ONDANSETRON HCL 4 MG/2ML IJ SOLN
4.0000 mg | Freq: Once | INTRAMUSCULAR | Status: AC
  Administered 2013-07-23: 4 mg via INTRAVENOUS
  Filled 2013-07-23: qty 2

## 2013-07-23 MED ORDER — KETOROLAC TROMETHAMINE 30 MG/ML IJ SOLN
30.0000 mg | Freq: Once | INTRAMUSCULAR | Status: AC
  Administered 2013-07-23: 30 mg via INTRAVENOUS
  Filled 2013-07-23: qty 2

## 2013-07-23 MED ORDER — HYDROCODONE-ACETAMINOPHEN 10-325 MG PO TABS: 1.0000 | ORAL_TABLET | Freq: Four times a day (QID) | ORAL | Status: DC | PRN

## 2013-07-23 MED ORDER — HYDROMORPHONE HCL PF 1 MG/ML IJ SOLN
1.0000 mg | Freq: Once | INTRAMUSCULAR | Status: AC
Start: 2013-07-23 — End: 2013-07-23
  Administered 2013-07-23: 1 mg via INTRAVENOUS
  Filled 2013-07-23: qty 1

## 2013-07-23 MED ORDER — SODIUM CHLORIDE 0.9 % IV SOLN
1000.0000 mL | Freq: Once | INTRAVENOUS | Status: AC
  Administered 2013-07-23: 1000 mL via INTRAVENOUS

## 2013-07-23 NOTE — ED Notes (Signed)
Not in waiting room X1

## 2013-07-23 NOTE — ED Notes (Signed)
Gave CG4 TO DR. lOCKWOOD

## 2013-07-23 NOTE — ED Notes (Signed)
Patient presents today with a chief complaint of RLQ abdominal pain that began this AM while resting. Patient denies flank pain, urinary symptoms of dysuria, frequency and urgency. Patient reports pain is 10/10. No rebound tenderness noted.

## 2013-07-23 NOTE — Discharge Instructions (Signed)
As discussed, it is important that you follow up with our urologist tomorrow for continued management of your condition.  If you develop any new, or concerning changes in your condition, please return to the emergency department immediately.    Kidney Stones Kidney stones (urolithiasis) are deposits that form inside your kidneys. The intense pain is caused by the stone moving through the urinary tract. When the stone moves, the ureter goes into spasm around the stone. The stone is usually passed in the urine.  CAUSES   A disorder that makes certain neck glands produce too much parathyroid hormone (primary hyperparathyroidism).  A buildup of uric acid crystals, similar to gout in your joints.  Narrowing (stricture) of the ureter.  A kidney obstruction present at birth (congenital obstruction).  Previous surgery on the kidney or ureters.  Numerous kidney infections. SYMPTOMS   Feeling sick to your stomach (nauseous).  Throwing up (vomiting).  Blood in the urine (hematuria).  Pain that usually spreads (radiates) to the groin.  Frequency or urgency of urination. DIAGNOSIS   Taking a history and physical exam.  Blood or urine tests.  CT scan.  Occasionally, an examination of the inside of the urinary bladder (cystoscopy) is performed. TREATMENT   Observation.  Increasing your fluid intake.  Extracorporeal shock wave lithotripsy This is a noninvasive procedure that uses shock waves to break up kidney stones.  Surgery may be needed if you have severe pain or persistent obstruction. There are various surgical procedures. Most of the procedures are performed with the use of small instruments. Only small incisions are needed to accommodate these instruments, so recovery time is minimized. The size, location, and chemical composition are all important variables that will determine the proper choice of action for you. Talk to your health care provider to better understand your  situation so that you will minimize the risk of injury to yourself and your kidney.  HOME CARE INSTRUCTIONS   Drink enough water and fluids to keep your urine clear or pale yellow. This will help you to pass the stone or stone fragments.  Strain all urine through the provided strainer. Keep all particulate matter and stones for your health care provider to see. The stone causing the pain may be as small as a grain of salt. It is very important to use the strainer each and every time you pass your urine. The collection of your stone will allow your health care provider to analyze it and verify that a stone has actually passed. The stone analysis will often identify what you can do to reduce the incidence of recurrences.  Only take over-the-counter or prescription medicines for pain, discomfort, or fever as directed by your health care provider.  Make a follow-up appointment with your health care provider as directed.  Get follow-up X-rays if required. The absence of pain does not always mean that the stone has passed. It may have only stopped moving. If the urine remains completely obstructed, it can cause loss of kidney function or even complete destruction of the kidney. It is your responsibility to make sure X-rays and follow-ups are completed. Ultrasounds of the kidney can show blockages and the status of the kidney. Ultrasounds are not associated with any radiation and can be performed easily in a matter of minutes. SEEK MEDICAL CARE IF:  You experience pain that is progressive and unresponsive to any pain medicine you have been prescribed. SEEK IMMEDIATE MEDICAL CARE IF:   Pain cannot be controlled with the prescribed medicine.  You have a fever or shaking chills.  The severity or intensity of pain increases over 18 hours and is not relieved by pain medicine.  You develop a new onset of abdominal pain.  You feel faint or pass out.  You are unable to urinate. MAKE SURE YOU:    Understand these instructions.  Will watch your condition.  Will get help right away if you are not doing well or get worse. Document Released: 05/22/2005 Document Revised: 01/22/2013 Document Reviewed: 10/23/2012 [facility] Patient Information 2014 [place].

## 2013-07-23 NOTE — ED Notes (Signed)
MD at bedside. 

## 2013-07-23 NOTE — ED Notes (Addendum)
PT WHILE ASLEEP ON CPAP MACHINE AT HOME. PT ASLEEP, PT  02 SATS DROP. PT CURRENTLY AWAKE 02 SATS 94 RA WITH NO ACUTE DISTRESS. EDP  LOCKWOOD MADE AWARE. NO NEW ORDERS GIVEN

## 2013-07-23 NOTE — ED Provider Notes (Signed)
CSN: [id number]     Arrival date & time 07/23/13  1151 History   First MD Initiated Contact with Patient 07/23/13 1154     Chief Complaint  Patient presents with  . Abdominal Pain      HPI Patient presents with relatively acute onset right lower quadrant pain.  Pain began within the past hours, has become more severe.  Pain is focally in the right lower quadrant, with no radiation.  Pain is severe, sharp, not improved by anything. There is associated nausea, no diarrhea. Patient had multiple trips for both urination and defecation yesterday, which is abnormal, though no pain yesterday. Currently patient has no fever, chills, disorientation, confusion. No history of abdominal surgery.  Past Medical History  Diagnosis Date  . Mental disorder     PTSD  . Hypertension   . Sleep apnea     uses cpap nightly  . Hyperlipidemia   . BPH (benign prostatic hyperplasia)   . Myocardial infarction     stent placed-2009 adn 2006 hx of mi x 3   . GERD (gastroesophageal reflux disease)   . Arthritis     generalized   . Complication of anesthesia     HEART ATTACK IN THE RECOVERY ROOM AFTER NECK SURGERY  . Diabetes mellitus     type 2 on pills   . Anxiety   . OSA (obstructive sleep apnea)   . Glaucoma   . PUD (peptic ulcer disease)   . PTSD (post-traumatic stress disorder)   . Obesity   . Aortic valve sclerosis     Mild no stenosis  . Coronary artery disease     PCI LAD 10/03, NSTEMI with PCI PDA 2/09, PCI of left circ 5/09, STEMI in setting of post op from spine surgery with nonobstructive thrombus in the diagonal and 70% LAD secondary to spasm with no PCI due to spinal surgery   Past Surgical History  Procedure Laterality Date  . Cardiac catheterization      4/11 ,PTCA w/stent '03,'09  . Cervical discectomy  4/11  . Arthroscopy  rt shoulder-2007  . Knee arthroscopy  1970's  . Coronary angioplasty      stents in 2006 and 2009   . Right shoulder subacromial decompression  05/23/11     . Knee arthroscopy with lateral menisectomy  06/10/2012    Procedure: KNEE ARTHROSCOPY WITH LATERAL MENISECTOMY;  Surgeon: [name], MD;  Location: [facility];  Service: Orthopedics;  Laterality: Right;  . Partial knee arthroplasty  06/10/2012    Procedure: UNICOMPARTMENTAL KNEE;  Surgeon: [name], MD;  Location: [facility];  Service: Orthopedics;  Laterality: Right;  Right Medial Uni Knee   No family history on file. History  Substance Use Topics  . Smoking status: Former Smoker    Quit date: 09/13/2007  . Smokeless tobacco: Never Used  . Alcohol Use: No    Review of Systems  Constitutional:       Per HPI, otherwise negative  HENT:       Per HPI, otherwise negative  Respiratory:       Per HPI, otherwise negative  Cardiovascular:       Per HPI, otherwise negative  Gastrointestinal: Positive for nausea and abdominal pain. Negative for vomiting.  Endocrine:       Negative aside from HPI  Genitourinary:       Neg aside from HPI   Musculoskeletal:       Per HPI, otherwise negative  Skin: Negative.  Neurological: Negative for syncope.      Allergies  Ace inhibitors; Metformin and related; and Niacin and related  Home Medications   Current Outpatient Rx  Name  Route  Sig  Dispense  Refill  . aspirin 81 MG tablet   Oral   Take 81 mg by mouth at bedtime. Instructed to stay on per [name],and [name]         . atorvastatin (LIPITOR) 40 MG tablet   Oral   Take 40 mg by mouth at bedtime.         . clopidogrel (PLAVIX) 75 MG tablet   Oral   Take 75 mg by mouth at bedtime.          . diphenhydrAMINE (BENADRYL) 25 mg capsule   Oral   Take 1 capsule (25 mg total) by mouth every 6 (six) hours as needed for itching, allergies or sleep.   30 capsule      . ezetimibe (ZETIA) 10 MG tablet   Oral   Take 10 mg by mouth at bedtime.          . ferrous sulfate 325 (65 FE) MG tablet   Oral   Take 1 tablet (325 mg total) by mouth 3 (three) times daily after  meals.         Marland Kitchen HYDROcodone-acetaminophen (NORCO) 7.5-325 MG per tablet   Oral   Take 1-2 tablets by mouth every 4 (four) hours as needed for pain.   120 tablet   0   . metoprolol (LOPRESSOR) 50 MG tablet   Oral   Take 100 mg by mouth at bedtime.          Marland Kitchen omeprazole (PRILOSEC) 20 MG capsule   Oral   Take 20 mg by mouth daily.          . polyethylene glycol (MIRALAX / GLYCOLAX) packet   Oral   Take 17 g by mouth 2 (two) times daily.   14 each      . QUEtiapine (SEROQUEL) 25 MG tablet   Oral   Take 25 mg by mouth at bedtime.          . ranitidine (ZANTAC) 150 MG tablet   Oral   Take 150 mg by mouth 2 (two) times daily.         . saxagliptin HCl (ONGLYZA) 5 MG TABS tablet   Oral   Take 5 mg by mouth every morning.          . sertraline (ZOLOFT) 50 MG tablet   Oral   Take 50 mg by mouth at bedtime.          . traZODone (DESYREL) 150 MG tablet   Oral   Take 150 mg by mouth at bedtime.           BP 208/108  Pulse 57  Temp(Src) 97.4 F (36.3 C) (Oral)  Resp 25  SpO2 98% Physical Exam  Nursing note and vitals reviewed. Constitutional: He is oriented to person, place, and time. He appears well-developed. He appears distressed.  HENT:  Head: Normocephalic and atraumatic.  Eyes: Conjunctivae and EOM are normal.  Cardiovascular: Normal rate and regular rhythm.   Pulmonary/Chest: Effort normal. No stridor. No respiratory distress.  Abdominal: He exhibits no distension.  Abdomen is soft, nontender, no peritoneal  Musculoskeletal: He exhibits no edema.  Neurological: He is alert and oriented to person, place, and time.  Skin: Skin is warm. He is diaphoretic.  Psychiatric: He has a  normal mood and affect.    ED Course  Procedures (including critical care time) Labs Review Labs Reviewed  CBC WITH DIFFERENTIAL - Abnormal; Notable for the following:    MCHC 36.3 (*)    All other components within normal limits  COMPREHENSIVE METABOLIC PANEL -  Abnormal; Notable for the following:    Sodium 135 (*)    Glucose, Bld 297 (*)    GFR calc non Af Amer 76 (*)    GFR calc Af Amer 88 (*)    All other components within normal limits  URINALYSIS, ROUTINE W REFLEX MICROSCOPIC - Abnormal; Notable for the following:    Glucose, UA >1000 (*)    Hgb urine dipstick MODERATE (*)    All other components within normal limits  CG4 I-STAT (LACTIC ACID) - Abnormal; Notable for the following:    Lactic Acid, Venous 3.08 (*)    All other components within normal limits  LIPASE, BLOOD  URINE MICROSCOPIC-ADD ON   Imaging Review Ct Abdomen Pelvis Wo Contrast  07/23/2013   CLINICAL DATA:  Right lower quadrant pain  EXAM: CT ABDOMEN AND PELVIS WITHOUT CONTRAST  TECHNIQUE: Multidetector CT imaging of the abdomen and pelvis was performed following the standard protocol without IV contrast.  COMPARISON:  CT ABDOMEN W/O CM dated 08/27/2008  FINDINGS: Mild right hydronephrosis and perinephric stranding. 9 mm proximal right ureteral calculus. Tiny calculus in the upper pole of the left kidney. 7 mm calculus in the lower pole of the left kidney. Benign cysts in the right kidney.  Coronary artery calcifications.  Liver, gallbladder, spleen, pancreas, adrenal glands are within normal limits.  Normal appendix.  Sigmoid diverticulosis.  Advanced L4-5 and L5-S1 degenerative disc disease. Severe right L4-5 lateral recess narrowing secondary to a gas-filled disc extrusion. Significant bilateral L5-S1 foraminal narrowing.  IMPRESSION: 9 mm right ureteral calculus associated with secondary findings of right ureteral obstruction.  Left nephrolithiasis.   Electronically Signed   By: Maryclare Bean M.D.   On: 07/23/2013 13:06    Lactic acid >3.  IVF started empirically, and will continue.  Update: Patient substantially better.  Update: I discussed the patient's case with our urology team.  Update: Patient's pain has subsided.  Blood pressure improved.  Patient will follow up with  urology tomorrow.  MDM   Final diagnoses:  None   Patient presents with acute onset right lower quadrant pain.  On exam he is uncomfortable, though he improved substantially here with initiation of therapy.  Patient has a 9 mm obstructive stone.  With his clinical improvement, he was discharged to followup with urology after I discussed his case with them to ensure appropriate ongoing care.    Carmin Muskrat, MD 07/23/13 [phone]

## 2013-07-24 DIAGNOSIS — N201 Calculus of ureter: Secondary | ICD-10-CM | POA: Diagnosis not present

## 2013-07-24 DIAGNOSIS — R82998 Other abnormal findings in urine: Secondary | ICD-10-CM | POA: Diagnosis not present

## 2013-07-25 ENCOUNTER — Ambulatory Visit (HOSPITAL_COMMUNITY): Payer: Medicare Other | Attending: Cardiology | Admitting: Radiology

## 2013-07-25 ENCOUNTER — Encounter: Payer: Self-pay | Admitting: Cardiology

## 2013-07-25 ENCOUNTER — Encounter (INDEPENDENT_AMBULATORY_CARE_PROVIDER_SITE_OTHER): Payer: Self-pay

## 2013-07-25 DIAGNOSIS — R011 Cardiac murmur, unspecified: Secondary | ICD-10-CM

## 2013-07-25 DIAGNOSIS — G4733 Obstructive sleep apnea (adult) (pediatric): Secondary | ICD-10-CM | POA: Diagnosis not present

## 2013-07-25 DIAGNOSIS — Z6833 Body mass index (BMI) 33.0-33.9, adult: Secondary | ICD-10-CM | POA: Insufficient documentation

## 2013-07-25 DIAGNOSIS — I2510 Atherosclerotic heart disease of native coronary artery without angina pectoris: Secondary | ICD-10-CM | POA: Diagnosis not present

## 2013-07-25 DIAGNOSIS — E785 Hyperlipidemia, unspecified: Secondary | ICD-10-CM | POA: Diagnosis not present

## 2013-07-25 DIAGNOSIS — I10 Essential (primary) hypertension: Secondary | ICD-10-CM | POA: Diagnosis not present

## 2013-07-25 DIAGNOSIS — E669 Obesity, unspecified: Secondary | ICD-10-CM | POA: Insufficient documentation

## 2013-07-25 NOTE — Progress Notes (Signed)
Echocardiogram performed.  

## 2013-08-01 ENCOUNTER — Telehealth: Payer: Self-pay | Admitting: Cardiology

## 2013-08-01 ENCOUNTER — Other Ambulatory Visit: Payer: Self-pay | Admitting: General Surgery

## 2013-08-01 DIAGNOSIS — Z01818 Encounter for other preprocedural examination: Secondary | ICD-10-CM

## 2013-08-01 DIAGNOSIS — I420 Dilated cardiomyopathy: Secondary | ICD-10-CM

## 2013-08-01 NOTE — Telephone Encounter (Signed)
Follow up  ° ° °Patient calling back to speak with nurse  °

## 2013-08-01 NOTE — Telephone Encounter (Signed)
New Message  Pt called. States that there is a form for Urologist that is Due. Pt states that he has to have a stone surgically removed and they are requesting that this form is faxed back immediatly. Please fax form back [facility] with Dr. Ellin Mayhew. (Pt did not provide fax #)

## 2013-08-01 NOTE — Telephone Encounter (Signed)
LVM for pt to return call. We need a Myoview stress test done on pt before he can have procedure.

## 2013-08-01 NOTE — Telephone Encounter (Signed)
SPoke with pt....

## 2013-08-04 ENCOUNTER — Telehealth (HOSPITAL_COMMUNITY): Payer: Self-pay | Admitting: *Deleted

## 2013-08-06 ENCOUNTER — Ambulatory Visit (HOSPITAL_COMMUNITY)
Admission: RE | Admit: 2013-08-06 | Discharge: 2013-08-06 | Disposition: A | Discharge status: Home / Self Care | Payer: Medicare Other | Source: Ambulatory Visit | Attending: Cardiovascular Disease | Admitting: Cardiovascular Disease

## 2013-08-06 DIAGNOSIS — I428 Other cardiomyopathies: Secondary | ICD-10-CM | POA: Diagnosis not present

## 2013-08-06 DIAGNOSIS — I420 Dilated cardiomyopathy: Secondary | ICD-10-CM

## 2013-08-06 MED ORDER — TECHNETIUM TC 99M SESTAMIBI GENERIC - CARDIOLITE
10.1000 | Freq: Once | INTRAVENOUS | Status: AC | PRN
  Administered 2013-08-06: 10.1 via INTRAVENOUS

## 2013-08-06 MED ORDER — TECHNETIUM TC 99M SESTAMIBI GENERIC - CARDIOLITE
30.6000 | Freq: Once | INTRAVENOUS | Status: AC | PRN
  Administered 2013-08-06: 31 via INTRAVENOUS

## 2013-08-06 NOTE — Procedures (Addendum)
[place] [address] [place] [address] [phone]  Cardiology Nuclear Med Study  Seth Washington is a 63 y.o. male     MRN : [id number]     DOB: 04-12-1951  Procedure Date: 08/06/2013  Nuclear Med Background Indication for Stress Test:  Surgical Clearance and [facility] History:  CAD;MI;STENT/PTCA-2003 & 2009;dialated cardiomyopathy;aortic valve sclerosis Cardiac Risk Factors: Family History - CAD, History of Smoking, Hypertension, Lipids, NIDDM and Obesity  Symptoms:  Dizziness, DOE and Light-Headedness   Nuclear Pre-Procedure Caffeine/Decaff Intake:  12:00am NPO After: 10am   IV Site: R Hand  IV 0.9% NS with Angio Cath:  22g  Chest Size (in):  44"  IV Started by: Azucena Cecil, RN  Height: 5\' 10"  (1.778 m)  Cup Size: n/a  BMI:  Body mass index is 33.72 kg/(m^2). Weight:  235 lb (106.595 kg)   Tech Comments:  n/a    Nuclear Med Study 1 or 2 day study: 1 day  Stress Test Type:  Stress  Order Authorizing Provider:  Fransico Him, MD   Resting Radionuclide: Technetium 37m Sestamibi  Resting Radionuclide Dose: 10.1 mCi   Stress Radionuclide:  Technetium 67m Sestamibi  Stress Radionuclide Dose: 30.6 mCi           Stress Protocol Rest HR: 74 Stress HR: 136  Rest BP: 124/80 Stress BP: 143/70  Exercise Time (min): 8 METS: 10.1   Predicted Max HR: 159 bpm % Max HR: 85.53 bpm Rate Pressure Product: 19720  Dose of Adenosine (mg):  n/a Dose of Lexiscan: n/a mg  Dose of Atropine (mg): n/a Dose of Dobutamine: n/a mcg/kg/min (at max HR)  Stress Test Technologist: Leane Para, CCT Nuclear Technologist: Imagene Riches, CNMT   Rest Procedure:  Myocardial perfusion imaging was performed at rest 45 minutes following the intravenous administration of Technetium 68m Sestamibi. Stress Procedure:  The patient performed treadmill exercise using a Bruce  Protocol for 8 minutes. The patient stopped due to SOB, Leg  Discomfort and general fatigue and denied any chest pain.  There were no significant ST-T wave changes.  Technetium 78m Sestamibi was injected at peak exercise and myocardial perfusion imaging was performed after a brief delay.  Transient Ischemic Dilatation (Normal <1.22):  0.84 Lung/Heart Ratio (Normal <0.45):  0.37 QGS EDV:  92 ml QGS ESV:  38 ml LV Ejection Fraction: 58%  Signed by       Rest ECG: NSR with small Q waves inferiorly  Stress ECG: No significant change from baseline ECG  QPS Raw Data Images:  Normal; no motion artifact; normal heart/lung ratio. Stress Images:  Moderate in size and intensity inferior wall defect extending distally to basally. Rest Images:   Moderate in size and intensity inferior wall defect Subtraction (SDS):  Predominant fixed inferior defect with very minimal peri-infarct ischemia  Impression Exercise Capacity:  Good exercise capacity. BP Response:  Normal blood pressure response. Clinical Symptoms:  Mild shortness of breath ECG Impression:  No significant ST segment change suggestive of ischemia. Comparison with Prior Nuclear Study: No images to compare  Overall Impression:  Low risk study demonstrating a moderate region of inferior scar with minimal peri-infarct ischemia.  LV Wall Motion:  NL LV Function, EF 58%; NL Wall Motion   KELLY,THOMAS A, MD  08/06/2013 6:09 PM

## 2013-08-07 NOTE — Telephone Encounter (Signed)
Follow Up  Pt called. States that there is a form for Urologist that is Due. Pt states that he has to have a stone surgically removed and they are requesting that this form is faxed back immediatly. Please fax form back [facility] with Dr. Ellin Mayhew.  Please call back to assist

## 2013-08-07 NOTE — Telephone Encounter (Signed)
[facility] to [facility] as [facility].

## 2013-08-07 NOTE — Telephone Encounter (Signed)
[facility]ed [facility] and made them aware pt has been cleared and stated for plavix he is ok to be off plavix for procedure but it is up to surgeon if they want him off it or not.

## 2013-08-08 ENCOUNTER — Encounter (HOSPITAL_COMMUNITY): Payer: Medicare Other

## 2013-08-08 ENCOUNTER — Other Ambulatory Visit: Payer: Self-pay | Admitting: Urology

## 2013-08-08 DIAGNOSIS — N201 Calculus of ureter: Secondary | ICD-10-CM | POA: Diagnosis not present

## 2013-08-11 ENCOUNTER — Encounter (HOSPITAL_COMMUNITY)
Admission: RE | Admit: 2013-08-11 | Discharge: 2013-08-11 | Disposition: A | Discharge status: Home / Self Care | Payer: Medicare Other | Source: Ambulatory Visit | Attending: Urology | Admitting: Urology

## 2013-08-11 ENCOUNTER — Encounter (HOSPITAL_COMMUNITY): Payer: Self-pay

## 2013-08-11 DIAGNOSIS — N1330 Unspecified hydronephrosis: Secondary | ICD-10-CM | POA: Diagnosis not present

## 2013-08-11 DIAGNOSIS — Z888 Allergy status to other drugs, medicaments and biological substances status: Secondary | ICD-10-CM | POA: Diagnosis not present

## 2013-08-11 DIAGNOSIS — Z8711 Personal history of peptic ulcer disease: Secondary | ICD-10-CM | POA: Diagnosis not present

## 2013-08-11 DIAGNOSIS — H409 Unspecified glaucoma: Secondary | ICD-10-CM | POA: Diagnosis not present

## 2013-08-11 DIAGNOSIS — Z87891 Personal history of nicotine dependence: Secondary | ICD-10-CM | POA: Diagnosis not present

## 2013-08-11 DIAGNOSIS — M129 Arthropathy, unspecified: Secondary | ICD-10-CM | POA: Diagnosis not present

## 2013-08-11 DIAGNOSIS — I2510 Atherosclerotic heart disease of native coronary artery without angina pectoris: Secondary | ICD-10-CM | POA: Diagnosis not present

## 2013-08-11 DIAGNOSIS — G4733 Obstructive sleep apnea (adult) (pediatric): Secondary | ICD-10-CM | POA: Diagnosis not present

## 2013-08-11 DIAGNOSIS — E119 Type 2 diabetes mellitus without complications: Secondary | ICD-10-CM | POA: Diagnosis not present

## 2013-08-11 DIAGNOSIS — I10 Essential (primary) hypertension: Secondary | ICD-10-CM | POA: Diagnosis not present

## 2013-08-11 DIAGNOSIS — E785 Hyperlipidemia, unspecified: Secondary | ICD-10-CM | POA: Diagnosis not present

## 2013-08-11 DIAGNOSIS — N200 Calculus of kidney: Secondary | ICD-10-CM | POA: Diagnosis not present

## 2013-08-11 DIAGNOSIS — N400 Enlarged prostate without lower urinary tract symptoms: Secondary | ICD-10-CM | POA: Diagnosis not present

## 2013-08-11 DIAGNOSIS — K219 Gastro-esophageal reflux disease without esophagitis: Secondary | ICD-10-CM | POA: Diagnosis not present

## 2013-08-11 DIAGNOSIS — I252 Old myocardial infarction: Secondary | ICD-10-CM | POA: Diagnosis not present

## 2013-08-11 DIAGNOSIS — N201 Calculus of ureter: Secondary | ICD-10-CM | POA: Diagnosis not present

## 2013-08-11 DIAGNOSIS — I359 Nonrheumatic aortic valve disorder, unspecified: Secondary | ICD-10-CM | POA: Diagnosis not present

## 2013-08-11 DIAGNOSIS — S43429A Sprain of unspecified rotator cuff capsule, initial encounter: Secondary | ICD-10-CM | POA: Diagnosis not present

## 2013-08-11 DIAGNOSIS — F4310 Post-traumatic stress disorder, unspecified: Secondary | ICD-10-CM | POA: Diagnosis not present

## 2013-08-11 NOTE — Patient Instructions (Addendum)
20 Seth Washington  08/11/2013   Your procedure is scheduled on: 3-11  -2015  Report to [facility] at    [place]    AM   Call this number if you have problems the morning of surgery: [phone]  Or Presurgical Testing [phone](Kalyse Meharg) For Living Will and/or Health Care Power Attorney Forms: please provide copy for your medical record,may bring AM of surgery(Forms should be already notarized -we do not provide this service).(08-11-13 Has connected living will to Financial will-would not prefer to provide at this time.)   For Cpap use: Bring mask and tubing only.   Do not eat food:After Midnight.    Take these medicines the morning of surgery with A SIP OF WATER: Hydrocodone. (Take usual Pm meds to include Metoprolol.)   Do not wear jewelry, make-up or nail polish.  Do not wear lotions, powders, or perfumes. You may wear deodorant.  Do not shave 48 hours(2 days) prior to first CHG shower(legs and under arms).(Shaving face and neck okay.)  Do not bring valuables to the hospital.(Hospital is not responsible for lost valuables).  Contacts, dentures or removable bridgework, body piercing, hair pins may not be worn into surgery.  Leave suitcase in the car. After surgery it may be brought to your room.  For patients admitted to the hospital, checkout time is 11:00 AM the day of discharge.(Restricted visitors-Any Persons displaying flu-like symptoms or illness).    Patients discharged the day of surgery will not be allowed to drive home. Must have responsible person with you x 24 hours once discharged.  Name and phone number of your driver: Judy-spouse [phone] cell  Special Instructions: CHG(Chlorhedine 4%-"Hibiclens","Betasept","Aplicare") Shower Use Special Wash: see special instructions.(avoid face and genitals)       Failure to follow these instructions may result in Cancellation of your surgery.   Patient  signature_______________________________________________________

## 2013-08-11 NOTE — Pre-Procedure Instructions (Deleted)
20 Mattew B Noland  08/11/2013   Your procedure is scheduled on:  3-9--2015  Report to [facility] at   0845     AM.  Call this number if you have problems the morning of surgery: [phone]  Or Presurgical Testing [phone](Kaleah Hagemeister) For Living Will and/or Health Care Power Attorney Forms: please provide copy for your medical record,may bring AM of surgery(Forms should be already notarized -we do not provide this service).(08-11-13 Living will is connected to financial will-will not provide at this time).   For Cpap use: Bring mask and tubing only.   Do not eat food:After Midnight.    Take these medicines the morning of surgery with A SIP OF WATER: Take all PM meds as usual(to include Metoprolol.)   Do not wear jewelry, make-up or nail polish.  Do not wear lotions, powders, or perfumes. You may wear deodorant.  Do not shave 48 hours(2 days) prior to first CHG shower(legs and under arms).(Shaving face and neck okay.)  Do not bring valuables to the hospital.(Hospital is not responsible for lost valuables).  Contacts, dentures or removable bridgework, body piercing, hair pins may not be worn into surgery.  Leave suitcase in the car. After surgery it may be brought to your room.  For patients admitted to the hospital, checkout time is 11:00 AM the day of discharge.(Restricted visitors-Any Persons displaying flu-like symptoms or illness).    Patients discharged the day of surgery will not be allowed to drive home. Must have responsible person with you x 24 hours once discharged.  Name and phone number of your driver: Judy-spouse [phone] cell  Special Instructions: CHG(Chlorhedine 4%-"Hibiclens","Betasept","Aplicare") Shower Use Special Wash: see special instructions.(avoid face and genital).  Failure to follow these instructions may result in Cancellation of your surgery.   Patient signature_______________________________________________________     

## 2013-08-11 NOTE — Pre-Procedure Instructions (Signed)
20 Seth Washington  08/11/2013   Your procedure is scheduled on:  3-9--2015  Report to [place] at   [place]     AM.  Call this number if you have problems the morning of surgery: [phone]  Or Presurgical Testing [phone]([name]) For Living Will and/or Health Care Power Attorney Forms: please provide copy for your medical record,may bring AM of surgery(Forms should be already notarized -we do not provide this service).(08-11-13 Living will is connected to financial will-will not provide at this time).   For Cpap use: Bring mask and tubing only.   Do not eat food:After Midnight.    Take these medicines the morning of surgery with A SIP OF WATER: Take all PM meds as usual(to include Metoprolol.)   Do not wear jewelry, make-up or nail polish.  Do not wear lotions, powders, or perfumes. You may wear deodorant.  Do not shave 48 hours(2 days) prior to first CHG shower(legs and under arms).(Shaving face and neck okay.)  Do not bring valuables to the hospital.(Hospital is not responsible for lost valuables).  Contacts, dentures or removable bridgework, body piercing, hair pins may not be worn into surgery.  Leave suitcase in the car. After surgery it may be brought to your room.  For patients admitted to the hospital, checkout time is 11:00 AM the day of discharge.(Restricted visitors-Any Persons displaying flu-like symptoms or illness).    Patients discharged the day of surgery will not be allowed to drive home. Must have responsible person with you x 24 hours once discharged.  Name and phone number of your driver: Judy-spouse [phone] cell  Special Instructions: CHG(Chlorhedine 4%-"Hibiclens","Betasept","Aplicare") Shower Use Special Wash: see special instructions.(avoid face and genital).  Failure to follow these instructions may result in Cancellation of your surgery.   Patient signature_______________________________________________________

## 2013-08-11 NOTE — Pre-Procedure Instructions (Addendum)
08-11-13 CBC/d, CMP 07-23-13 Epic. EKG /Stress 08-06-13,with cardiac clearance note Epic(Dr. Radford Pax). Echo 2'15 Epic. CXR 1'14, CT abd/pelvis 2'15 -Epic.

## 2013-08-13 ENCOUNTER — Encounter (HOSPITAL_COMMUNITY): Payer: Self-pay | Admitting: *Deleted

## 2013-08-13 ENCOUNTER — Ambulatory Visit (HOSPITAL_COMMUNITY): Payer: Medicare Other

## 2013-08-13 ENCOUNTER — Encounter (HOSPITAL_COMMUNITY)
Admission: RE | Disposition: A | Discharge status: Home / Self Care | Payer: Self-pay | Source: Ambulatory Visit | Attending: Urology

## 2013-08-13 ENCOUNTER — Ambulatory Visit (HOSPITAL_COMMUNITY): Payer: Medicare Other | Admitting: Anesthesiology

## 2013-08-13 ENCOUNTER — Encounter (HOSPITAL_COMMUNITY): Payer: Medicare Other | Admitting: Anesthesiology

## 2013-08-13 ENCOUNTER — Ambulatory Visit (HOSPITAL_COMMUNITY)
Admission: RE | Admit: 2013-08-13 | Discharge: 2013-08-13 | Disposition: A | Discharge status: Home / Self Care | Payer: Medicare Other | Source: Ambulatory Visit | Attending: Urology | Admitting: Urology

## 2013-08-13 DIAGNOSIS — K219 Gastro-esophageal reflux disease without esophagitis: Secondary | ICD-10-CM | POA: Insufficient documentation

## 2013-08-13 DIAGNOSIS — I359 Nonrheumatic aortic valve disorder, unspecified: Secondary | ICD-10-CM | POA: Insufficient documentation

## 2013-08-13 DIAGNOSIS — N1330 Unspecified hydronephrosis: Secondary | ICD-10-CM | POA: Insufficient documentation

## 2013-08-13 DIAGNOSIS — Z01818 Encounter for other preprocedural examination: Secondary | ICD-10-CM | POA: Diagnosis not present

## 2013-08-13 DIAGNOSIS — M129 Arthropathy, unspecified: Secondary | ICD-10-CM | POA: Insufficient documentation

## 2013-08-13 DIAGNOSIS — I2510 Atherosclerotic heart disease of native coronary artery without angina pectoris: Secondary | ICD-10-CM | POA: Insufficient documentation

## 2013-08-13 DIAGNOSIS — E785 Hyperlipidemia, unspecified: Secondary | ICD-10-CM | POA: Insufficient documentation

## 2013-08-13 DIAGNOSIS — N200 Calculus of kidney: Secondary | ICD-10-CM | POA: Diagnosis not present

## 2013-08-13 DIAGNOSIS — Z87891 Personal history of nicotine dependence: Secondary | ICD-10-CM | POA: Insufficient documentation

## 2013-08-13 DIAGNOSIS — F4310 Post-traumatic stress disorder, unspecified: Secondary | ICD-10-CM | POA: Insufficient documentation

## 2013-08-13 DIAGNOSIS — I252 Old myocardial infarction: Secondary | ICD-10-CM | POA: Insufficient documentation

## 2013-08-13 DIAGNOSIS — I10 Essential (primary) hypertension: Secondary | ICD-10-CM | POA: Diagnosis not present

## 2013-08-13 DIAGNOSIS — Z888 Allergy status to other drugs, medicaments and biological substances status: Secondary | ICD-10-CM | POA: Insufficient documentation

## 2013-08-13 DIAGNOSIS — E119 Type 2 diabetes mellitus without complications: Secondary | ICD-10-CM | POA: Insufficient documentation

## 2013-08-13 DIAGNOSIS — N201 Calculus of ureter: Secondary | ICD-10-CM | POA: Insufficient documentation

## 2013-08-13 DIAGNOSIS — G4733 Obstructive sleep apnea (adult) (pediatric): Secondary | ICD-10-CM | POA: Insufficient documentation

## 2013-08-13 DIAGNOSIS — H409 Unspecified glaucoma: Secondary | ICD-10-CM | POA: Insufficient documentation

## 2013-08-13 DIAGNOSIS — Z8711 Personal history of peptic ulcer disease: Secondary | ICD-10-CM | POA: Insufficient documentation

## 2013-08-13 DIAGNOSIS — N400 Enlarged prostate without lower urinary tract symptoms: Secondary | ICD-10-CM | POA: Insufficient documentation

## 2013-08-13 HISTORY — PX: CYSTOSCOPY WITH RETROGRADE PYELOGRAM, URETEROSCOPY AND STENT PLACEMENT: SHX5789

## 2013-08-13 HISTORY — PX: HOLMIUM LASER APPLICATION: SHX5852

## 2013-08-13 LAB — GLUCOSE, CAPILLARY: GLUCOSE-CAPILLARY: 194 mg/dL — AB (ref 70–99)

## 2013-08-13 SURGERY — CYSTOURETEROSCOPY, WITH RETROGRADE PYELOGRAM AND STENT INSERTION
Anesthesia: General | Laterality: Right

## 2013-08-13 MED ORDER — GLYCOPYRROLATE 0.2 MG/ML IJ SOLN
INTRAMUSCULAR | Status: AC
  Filled 2013-08-13: qty 1

## 2013-08-13 MED ORDER — SUCCINYLCHOLINE CHLORIDE 20 MG/ML IJ SOLN
INTRAMUSCULAR | Status: AC
  Filled 2013-08-13: qty 1

## 2013-08-13 MED ORDER — OXYCODONE HCL 5 MG PO TABS: 5.0000 mg | ORAL_TABLET | Freq: Once | ORAL | Status: DC | PRN

## 2013-08-13 MED ORDER — SODIUM CHLORIDE 0.9 % IR SOLN
Status: DC | PRN
  Administered 2013-08-13: 3000 mL via INTRAVESICAL

## 2013-08-13 MED ORDER — CEFAZOLIN SODIUM-DEXTROSE 2-3 GM-% IV SOLR
2.0000 g | INTRAVENOUS | Status: AC
  Administered 2013-08-13: 2 g via INTRAVENOUS

## 2013-08-13 MED ORDER — LIDOCAINE HCL (CARDIAC) 20 MG/ML IV SOLN
INTRAVENOUS | Status: DC | PRN
  Administered 2013-08-13: 100 mg via INTRAVENOUS

## 2013-08-13 MED ORDER — LIDOCAINE HCL (CARDIAC) 20 MG/ML IV SOLN
INTRAVENOUS | Status: AC
  Filled 2013-08-13: qty 5

## 2013-08-13 MED ORDER — BELLADONNA ALKALOIDS-OPIUM 16.2-60 MG RE SUPP
RECTAL | Status: DC | PRN
  Administered 2013-08-13: 1 via RECTAL

## 2013-08-13 MED ORDER — EPHEDRINE SULFATE 50 MG/ML IJ SOLN
INTRAMUSCULAR | Status: AC
  Filled 2013-08-13: qty 1

## 2013-08-13 MED ORDER — SENNOSIDES-DOCUSATE SODIUM 8.6-50 MG PO TABS: 1.0000 | ORAL_TABLET | Freq: Two times a day (BID) | ORAL | Status: DC

## 2013-08-13 MED ORDER — CEPHALEXIN 500 MG PO CAPS: 500.0000 mg | ORAL_CAPSULE | Freq: Three times a day (TID) | ORAL | Status: DC

## 2013-08-13 MED ORDER — PHENAZOPYRIDINE HCL 100 MG PO TABS: 100.0000 mg | ORAL_TABLET | Freq: Three times a day (TID) | ORAL | Status: DC | PRN

## 2013-08-13 MED ORDER — ACETAMINOPHEN 10 MG/ML IV SOLN
INTRAVENOUS | Status: DC | PRN
  Administered 2013-08-13: 1000 mg via INTRAVENOUS

## 2013-08-13 MED ORDER — LIDOCAINE HCL 2 % EX GEL
CUTANEOUS | Status: AC
  Filled 2013-08-13: qty 10

## 2013-08-13 MED ORDER — PHENYLEPHRINE 40 MCG/ML (10ML) SYRINGE FOR IV PUSH (FOR BLOOD PRESSURE SUPPORT)
PREFILLED_SYRINGE | INTRAVENOUS | Status: AC
  Filled 2013-08-13: qty 10

## 2013-08-13 MED ORDER — PROPOFOL 10 MG/ML IV BOLUS
INTRAVENOUS | Status: AC
  Filled 2013-08-13: qty 20

## 2013-08-13 MED ORDER — HYDROMORPHONE HCL PF 1 MG/ML IJ SOLN: 0.2500 mg | INTRAMUSCULAR | Status: DC | PRN

## 2013-08-13 MED ORDER — GLYCOPYRROLATE 0.2 MG/ML IJ SOLN
INTRAMUSCULAR | Status: DC | PRN
  Administered 2013-08-13: 0.2 mg via INTRAVENOUS

## 2013-08-13 MED ORDER — EPHEDRINE SULFATE 50 MG/ML IJ SOLN
INTRAMUSCULAR | Status: DC | PRN
  Administered 2013-08-13 (×3): 10 mg via INTRAVENOUS

## 2013-08-13 MED ORDER — MEPERIDINE HCL 50 MG/ML IJ SOLN: 6.2500 mg | INTRAMUSCULAR | Status: DC | PRN

## 2013-08-13 MED ORDER — LIDOCAINE HCL 2 % EX GEL
CUTANEOUS | Status: DC | PRN
  Administered 2013-08-13: 1 via URETHRAL

## 2013-08-13 MED ORDER — HYOSCYAMINE SULFATE 0.125 MG PO TABS: 0.1250 mg | ORAL_TABLET | ORAL | Status: DC | PRN

## 2013-08-13 MED ORDER — OXYCODONE-ACETAMINOPHEN 5-325 MG PO TABS: 1.0000 | ORAL_TABLET | ORAL | Status: DC | PRN

## 2013-08-13 MED ORDER — ONDANSETRON HCL 4 MG/2ML IJ SOLN
INTRAMUSCULAR | Status: AC
  Filled 2013-08-13: qty 2

## 2013-08-13 MED ORDER — TAMSULOSIN HCL 0.4 MG PO CAPS: 0.8000 mg | ORAL_CAPSULE | Freq: Every day | ORAL | Status: DC

## 2013-08-13 MED ORDER — IOHEXOL 350 MG/ML SOLN
INTRAVENOUS | Status: DC | PRN
  Administered 2013-08-13: 50 mL

## 2013-08-13 MED ORDER — LACTATED RINGERS IV SOLN
INTRAVENOUS | Status: DC
  Administered 2013-08-13: 1000 mL via INTRAVENOUS

## 2013-08-13 MED ORDER — PHENYLEPHRINE HCL 10 MG/ML IJ SOLN
INTRAMUSCULAR | Status: DC | PRN
  Administered 2013-08-13 (×2): 80 ug via INTRAVENOUS

## 2013-08-13 MED ORDER — OXYCODONE HCL 5 MG/5ML PO SOLN
5.0000 mg | Freq: Once | ORAL | Status: DC | PRN
  Filled 2013-08-13: qty 5

## 2013-08-13 MED ORDER — MIDAZOLAM HCL 5 MG/5ML IJ SOLN
INTRAMUSCULAR | Status: DC | PRN
  Administered 2013-08-13: 2 mg via INTRAVENOUS

## 2013-08-13 MED ORDER — ACETAMINOPHEN 10 MG/ML IV SOLN
1000.0000 mg | Freq: Once | INTRAVENOUS | Status: DC
  Filled 2013-08-13: qty 100

## 2013-08-13 MED ORDER — FENTANYL CITRATE 0.05 MG/ML IJ SOLN
INTRAMUSCULAR | Status: AC
  Filled 2013-08-13: qty 5

## 2013-08-13 MED ORDER — CEFAZOLIN SODIUM-DEXTROSE 2-3 GM-% IV SOLR
INTRAVENOUS | Status: AC
  Filled 2013-08-13: qty 50

## 2013-08-13 MED ORDER — ONDANSETRON HCL 4 MG/2ML IJ SOLN
INTRAMUSCULAR | Status: DC | PRN
  Administered 2013-08-13: 4 mg via INTRAVENOUS

## 2013-08-13 MED ORDER — PROMETHAZINE HCL 25 MG/ML IJ SOLN: 6.2500 mg | INTRAMUSCULAR | Status: DC | PRN

## 2013-08-13 MED ORDER — LACTATED RINGERS IV SOLN
INTRAVENOUS | Status: DC | PRN
  Administered 2013-08-13 (×2): via INTRAVENOUS

## 2013-08-13 MED ORDER — PROPOFOL 10 MG/ML IV BOLUS
INTRAVENOUS | Status: DC | PRN
  Administered 2013-08-13: 200 mg via INTRAVENOUS

## 2013-08-13 MED ORDER — BELLADONNA ALKALOIDS-OPIUM 16.2-60 MG RE SUPP
RECTAL | Status: AC
  Filled 2013-08-13: qty 1

## 2013-08-13 MED ORDER — MIDAZOLAM HCL 2 MG/2ML IJ SOLN
INTRAMUSCULAR | Status: AC
  Filled 2013-08-13: qty 2

## 2013-08-13 MED ORDER — FENTANYL CITRATE 0.05 MG/ML IJ SOLN
INTRAMUSCULAR | Status: DC | PRN
  Administered 2013-08-13 (×3): 50 ug via INTRAVENOUS

## 2013-08-13 SURGICAL SUPPLY — 33 items
BAG URO CATCHER STRL LF (DRAPE) ×3 IMPLANT
BASKET LASER NITINOL 1.9FR (BASKET) IMPLANT
BASKET STNLS GEMINI 4WIRE 3FR (BASKET) IMPLANT
BASKET ZERO TIP NITINOL 2.4FR (BASKET) IMPLANT
BSKT STON RTRVL 120 1.9FR (BASKET)
BSKT STON RTRVL GEM 120X11 3FR (BASKET)
BSKT STON RTRVL ZERO TP 2.4FR (BASKET)
CATH CLEAR GEL 3F BACKSTOP (CATHETERS) IMPLANT
CATH URET 5FR 28IN CONE TIP (BALLOONS)
CATH URET 5FR 28IN OPEN ENDED (CATHETERS) ×2 IMPLANT
CATH URET 5FR 70CM CONE TIP (BALLOONS) IMPLANT
CATH URET DUAL LUMEN 6-10FR 50 (CATHETERS) IMPLANT
CLOTH BEACON ORANGE TIMEOUT ST (SAFETY) ×3 IMPLANT
DRAPE CAMERA CLOSED 9X96 (DRAPES) ×1 IMPLANT
EXTRACTOR STONE NITINOL NGAGE (UROLOGICAL SUPPLIES) ×2 IMPLANT
FIBER LASER FLEXIVA 200 (UROLOGICAL SUPPLIES) ×2 IMPLANT
FIBER LASER FLEXIVA 365 (UROLOGICAL SUPPLIES) IMPLANT
GLOVE BIOGEL M 7.0 STRL (GLOVE) ×3 IMPLANT
GOWN STRL REUS W/TWL LRG LVL3 (GOWN DISPOSABLE) ×3 IMPLANT
GOWN STRL REUS W/TWL XL LVL3 (GOWN DISPOSABLE) ×3 IMPLANT
GUIDEWIRE ANG ZIPWIRE 038X150 (WIRE) IMPLANT
GUIDEWIRE STR DUAL SENSOR (WIRE) ×5 IMPLANT
IV NS IRRIG 3000ML ARTHROMATIC (IV SOLUTION) ×3 IMPLANT
MANIFOLD NEPTUNE II (INSTRUMENTS) ×3 IMPLANT
PACK CYSTO (CUSTOM PROCEDURE TRAY) ×3 IMPLANT
SCRUB PCMX 4 OZ (MISCELLANEOUS) IMPLANT
SHEATH ACCESS URETERAL 38CM (SHEATH) IMPLANT
SHEATH URET ACCESS 12FR/35CM (UROLOGICAL SUPPLIES) IMPLANT
SHEATH URET ACCESS 12FR/55CM (UROLOGICAL SUPPLIES) IMPLANT
STENT POLARIS 5FRX26 (STENTS) ×2 IMPLANT
SYRINGE IRR TOOMEY STRL 70CC (SYRINGE) IMPLANT
TUBING CONNECTING 10 (TUBING) ×2 IMPLANT
TUBING CONNECTING 10' (TUBING) ×1

## 2013-08-13 NOTE — Anesthesia Preprocedure Evaluation (Addendum)
Anesthesia Evaluation  Patient identified by MRN, date of birth, ID band Patient awake    Reviewed: Allergy & Precautions, H&P , NPO status , Patient's Chart, lab work & pertinent test results, reviewed documented beta blocker date and time   Airway Mallampati: II TM Distance: >3 FB Neck ROM: Full    Dental  (+) Dental Advisory Given   Pulmonary sleep apnea , former smoker,  breath sounds clear to auscultation        Cardiovascular hypertension, Pt. on medications and Pt. on home beta blockers + CAD, + Past MI and + Cardiac Stents + Valvular Problems/Murmurs Rhythm:Regular Rate:Normal     Neuro/Psych PSYCHIATRIC DISORDERS Anxiety negative neurological ROS  negative psych ROS   GI/Hepatic Neg liver ROS, PUD, GERD-  Medicated,  Endo/Other  diabetes, Type 2, Oral Hypoglycemic Agents  Renal/GU negative Renal ROS     Musculoskeletal negative musculoskeletal ROS (+)   Abdominal   Peds  Hematology negative hematology ROS (+)   Anesthesia Other Findings   Reproductive/Obstetrics                          Anesthesia Physical Anesthesia Plan  ASA: III  Anesthesia Plan: General   Post-op Pain Management:    Induction: Intravenous  Airway Management Planned: LMA  Additional Equipment:   Intra-op Plan:   Post-operative Plan: Extubation in OR  Informed Consent: I have reviewed the patients History and Physical, chart, labs and discussed the procedure including the risks, benefits and alternatives for the proposed anesthesia with the patient or authorized representative who has indicated his/her understanding and acceptance.   Dental advisory given  Plan Discussed with: CRNA  Anesthesia Plan Comments:         Anesthesia Quick Evaluation                                  Anesthesia Evaluation  Patient identified by MRN, date of birth, ID band Patient awake    Reviewed: Allergy &  Precautions, H&P , NPO status , Patient's Chart, lab work & pertinent test results, reviewed documented beta blocker date and time   History of Anesthesia Complications Negative for: history of anesthetic complications  Airway Mallampati: III TM Distance: >3 FB Neck ROM: full    Dental No notable dental hx. (+) Teeth Intact, Dental Advisory Given and Caps   Pulmonary neg pulmonary ROS, sleep apnea and Continuous Positive Airway Pressure Ventilation ,  breath sounds clear to auscultation  Pulmonary exam normal       Cardiovascular Exercise Tolerance: Good hypertension, On Home Beta Blockers + CAD, + Past MI (MI in April 2011 following cervical disc surgery.) and + Cardiac Stents (Cardiac stents X 4; last stent 2009. Patient denies cardiac symptoms, cleared for surgical procedure  by cardiologist.) Rhythm:regular Rate:Normal  angioplasty   Neuro/Psych PSYCHIATRIC DISORDERS PTSDCervical diskectomy negative neurological ROS  negative psych ROS   GI/Hepatic negative GI ROS, Neg liver ROS, GERD-  Medicated and Controlled,  Endo/Other  negative endocrine ROSdiabetes, Type 2, Oral Hypoglycemic Agents  Renal/GU negative Renal ROS   BPH    Musculoskeletal   Abdominal (+) + obese,   Peds  Hematology negative hematology ROS (+)   Anesthesia Other Findings   Reproductive/Obstetrics negative OB ROS  Anesthesia Physical  Anesthesia Plan  ASA: III  Anesthesia Plan: Spinal   Post-op Pain Management:    Induction: Intravenous  Airway Management Planned: Simple Face Mask  Additional Equipment:   Intra-op Plan:   Post-operative Plan: Extubation in OR  Informed Consent: I have reviewed the patients History and Physical, chart, labs and discussed the procedure including the risks, benefits and alternatives for the proposed anesthesia with the patient or authorized representative who has indicated his/her  understanding and acceptance.   Dental Advisory Given  Plan Discussed with: CRNA and Surgeon  Anesthesia Plan Comments:         Anesthesia Quick Evaluation                                   Anesthesia Evaluation  Patient identified by MRN, date of birth, ID band Patient awake    Reviewed: Allergy & Precautions, H&P , NPO status , Patient's Chart, lab work & pertinent test results, reviewed documented beta blocker date and time   History of Anesthesia Complications Negative for: history of anesthetic complications  Airway Mallampati: III TM Distance: >3 FB Neck ROM: full    Dental No notable dental hx. (+) Teeth Intact, Dental Advisory Given and Caps   Pulmonary neg pulmonary ROS, sleep apnea and Continuous Positive Airway Pressure Ventilation ,  clear to auscultation  Pulmonary exam normal       Cardiovascular Exercise Tolerance: Good hypertension, On Home Beta Blockers + CAD, + Past MI (MI in April 2011 following cervical disc surgery.) and + Cardiac Stents (Cardiac stents X 4; last stent 2009. Patient denies cardiac symptoms, cleared for surgical procedure  by cardiologist.) regular Normal angioplasty   Neuro/Psych PSYCHIATRIC DISORDERS PTSDCervical diskectomy Negative Neurological ROS  Negative Psych ROS   GI/Hepatic negative GI ROS, Neg liver ROS, GERD-  Medicated and Controlled,  Endo/Other  Negative Endocrine ROSDiabetes mellitus-, Well Controlled, Type 2, Oral Hypoglycemic AgentsPatient states he took oral diabetic agent this AM  Renal/GU negative Renal ROS   BPH    Musculoskeletal   Abdominal (+) obese,   Peds  Hematology negative hematology ROS (+)   Anesthesia Other Findings   Reproductive/Obstetrics negative OB ROS                        Anesthesia Physical Anesthesia Plan  ASA: III  Anesthesia Plan: General   Post-op Pain Management:    Induction: Intravenous  Airway Management Planned: Oral  ETT  Additional Equipment:   Intra-op Plan:   Post-operative Plan: Extubation in OR  Informed Consent: I have reviewed the patients History and Physical, chart, labs and discussed the procedure including the risks, benefits and alternatives for the proposed anesthesia with the patient or authorized representative who has indicated his/her understanding and acceptance.   Dental Advisory Given  Plan Discussed with: CRNA and Surgeon  Anesthesia Plan Comments:        Anesthesia Quick Evaluation                                   Anesthesia Evaluation  Patient identified by MRN, date of birth, ID band Patient awake    Reviewed: Allergy & Precautions, H&P , NPO status , Patient's Chart, lab work & pertinent test results, reviewed documented beta blocker date and time   History of Anesthesia Complications  Negative for: history of anesthetic complications  Airway Mallampati: III TM Distance: >3 FB Neck ROM: full    Dental No notable dental hx. (+) Teeth Intact, Dental Advisory Given and Caps   Pulmonary neg pulmonary ROS, sleep apnea and Continuous Positive Airway Pressure Ventilation ,  clear to auscultation  Pulmonary exam normal       Cardiovascular Exercise Tolerance: Good hypertension, On Home Beta Blockers + CAD, + Past MI (MI in April 2011 following cervical disc surgery.) and + Cardiac Stents (Cardiac stents X 4; last stent 2009. Patient denies cardiac symptoms, cleared for surgical procedure  by cardiologist.) regular Normal angioplasty   Neuro/Psych PSYCHIATRIC DISORDERS PTSDCervical diskectomy Negative Neurological ROS  Negative Psych ROS   GI/Hepatic negative GI ROS, Neg liver ROS, GERD-  Medicated and Controlled,  Endo/Other  Negative Endocrine ROSDiabetes mellitus-, Well Controlled, Type 2, Oral Hypoglycemic AgentsPatient states he took oral diabetic agent this AM  Renal/GU negative Renal ROS   BPH    Musculoskeletal   Abdominal (+)  obese,   Peds  Hematology negative hematology ROS (+)   Anesthesia Other Findings   Reproductive/Obstetrics negative OB ROS                        Anesthesia Physical Anesthesia Plan  ASA: III  Anesthesia Plan: General   Post-op Pain Management:    Induction: Intravenous  Airway Management Planned: Oral ETT  Additional Equipment:   Intra-op Plan:   Post-operative Plan: Extubation in OR  Informed Consent: I have reviewed the patients History and Physical, chart, labs and discussed the procedure including the risks, benefits and alternatives for the proposed anesthesia with the patient or authorized representative who has indicated his/her understanding and acceptance.   Dental Advisory Given  Plan Discussed with: CRNA and Surgeon  Anesthesia Plan Comments:        Anesthesia Quick Evaluation

## 2013-08-13 NOTE — H&P (Signed)
Urology History and Physical Exam  CC: Right ureter stone.  HPI:  63 year old male presents today for right ureter stone.  This was discovered when he presented to the ER 07/23/13.  He had a CT scan done.  This showed a right proximal ureter stone.  This was 0.9 mm in size.  It was not 160 pounds but in its intensity.  It was associated with right hydronephrosis.  He also had kidney stones in his left kidney.  He has a history of heart attack and is on Plavix.  He is cleared by his cardiologist, Dr. Golden Hurter, to continue with the surgery but it was recommended decision to discontinue Plavix or not be my decision.  I feel that we can proceed with the planned procedure while taking Plavix.  He presents today for cystoscopy, right ureteroscopy, laser lithotripsy, possible right retrograde program, possible right ureter stent placement.  We discussed the risks, benefits, alternatives, and likelihood of achieving goals. Urine culture from 2/619/50 negative for growth.  In the interim, the patient felt that he may pass the stone.  He presented for KUB in clinic 08/08/13.  This showed a calcification in the right pelvis which could represent migration of the stone distally.  We discussed options including further observation, repeat CT scan, or proceed with surgery.  He wishes to proceed with surgery.  PMH: Past Medical History  Diagnosis Date  . Mental disorder     PTSD  . Hypertension   . Sleep apnea     uses cpap nightly  . Hyperlipidemia   . BPH (benign prostatic hyperplasia)   . Myocardial infarction     stent placed-2009 adn 2006 hx of mi x 3   . GERD (gastroesophageal reflux disease)   . Arthritis     generalized   . Complication of anesthesia     HEART ATTACK IN THE RECOVERY ROOM AFTER NECK SURGERY  . Diabetes mellitus     type 2 on pills   . Anxiety   . OSA (obstructive sleep apnea)   . Glaucoma   . PUD (peptic ulcer disease)   . PTSD (post-traumatic stress disorder)   . Obesity    . Aortic valve sclerosis     Mild no stenosis  . Coronary artery disease     PCI LAD 10/03, NSTEMI with PCI PDA 2/09, PCI of left circ 5/09, STEMI in setting of post op from spine surgery with nonobstructive thrombus in the diagonal and 70% LAD secondary to spasm with no PCI due to spinal surgery    PSH: Past Surgical History  Procedure Laterality Date  . Cardiac catheterization      4/11 ,PTCA w/stent '03,'09  . Cervical discectomy  4/11  . Arthroscopy  rt shoulder-2007  . Knee arthroscopy  1970's  . Coronary angioplasty      stents in 2006 and 2009   . Right shoulder subacromial decompression  05/23/11    . Knee arthroscopy with lateral menisectomy  06/10/2012    Procedure: KNEE ARTHROSCOPY WITH LATERAL MENISECTOMY;  Surgeon: [name], MD;  Location: [facility];  Service: Orthopedics;  Laterality: Right;  . Partial knee arthroplasty  06/10/2012    Procedure: UNICOMPARTMENTAL KNEE;  Surgeon: [name], MD;  Location: [facility];  Service: Orthopedics;  Laterality: Right;  Right Medial Uni Knee    Allergies: Allergies  Allergen Reactions  . Ace Inhibitors Cough  . Metformin And Related Diarrhea    Severe diarrhea  . Niacin And Related  Face flushed    Medications: No prescriptions prior to admission     Social History: History   Social History  . Marital Status: Married    Spouse Name: N/A    Number of Children: N/A  . Years of Education: N/A   Occupational History  . Not on file.   Social History Main Topics  . Smoking status: Former Smoker    Quit date: 09/13/2007  . Smokeless tobacco: Never Used  . Alcohol Use: No  . Drug Use: No  . Sexual Activity: Not on file   Other Topics Concern  . Not on file   Social History Narrative  . No narrative on file    Family History: No family history on file.  Review of Systems: Positive: None. Negative: Fever, SOB, or chest pain.  A further 10 point review of systems was negative except what is listed in  the HPI.  Physical Exam: Filed Vitals:   08/13/13 0847  BP: 111/63  Pulse: 63  Temp: 97.3 F (36.3 C)  Resp: 18    General: No acute distress.  Awake. Head:  Normocephalic.  Atraumatic. ENT:  EOMI.  Mucous membranes moist Neck:  Supple.  No lymphadenopathy. CV:  S1 present. S2 present. Regular rate. Pulmonary: Equal effort bilaterally.  Clear to auscultation bilaterally. Abdomen: Soft.  Non- tender to palpation. Skin:  Normal turgor.  No visible rash. Extremity: No gross deformity of bilateral upper extremities.  No gross deformity of    bilateral lower extremities. Neurologic: Alert. Appropriate mood.    Studies:  No results found for this basename: HGB, WBC, PLT,  in the last 72 hours  No results found for this basename: NA, K, CL, CO2, BUN, CREATININE, CALCIUM, MAGNESIUM, GFRNONAA, GFRAA,  in the last 72 hours   No results found for this basename: PT, INR, APTT,  in the last 72 hours   No components found with this basename: ABG,     Assessment:  Right ureter stone.  Plan: To OR for cystoscopy, right ureteroscopy, laser lithotripsy, possible right retrograde program, possible right ureter stent placement.

## 2013-08-13 NOTE — Discharge Instructions (Signed)
DISCHARGE INSTRUCTIONS FOR KIDNEY STONES OR URETERAL STENT  MEDICATIONS:   1.  Resume all your other meds from home.  ACTIVITY 1. No strenuous activity x 1week 2. No driving while on narcotic pain medications 3. Drink plenty of water 4. Continue to walk at home - you can still get blood clots when you are at home, so keep active, but don't over do it. 5. May return to work in 3 days.  BATHING 1. You can shower or bath.    SIGNS/SYMPTOMS TO CALL: 1. Please call us if you have a fever greater than 101.5, uncontrolled  nausea/vomiting, uncontrolled pain, dizziness, unable to urinate, chest pain, shortness of breath, leg swelling, leg pain, redness around wound, drainage from wound, or any other concerns or questions.  You can reach [place] at [phone].

## 2013-08-13 NOTE — Anesthesia Postprocedure Evaluation (Signed)
Anesthesia Post Note  Patient: TRUE Garciamartinez Carlino  Procedure(s) Performed: Procedure(s) (LRB): CYSTOSCOPY WITH RETROGRADE PYELOGRAM, URETEROSCOPY AND STENT PLACEMENT (Right) HOLMIUM LASER APPLICATION (Right)  Anesthesia type: General  Patient location: PACU  Post pain: Pain level controlled  Post assessment: Post-op Vital signs reviewed  Last Vitals: BP 124/77  Pulse 60  Temp(Src) 36.6 C (Oral)  Resp 14  SpO2 92%  Post vital signs: Reviewed  Level of consciousness: sedated  Complications: No apparent anesthesia complications

## 2013-08-13 NOTE — Op Note (Signed)
Urology Operative Report  Date of Procedure: 08/13/13  Surgeon: Rolan Bucco, MD Assistant:  None  Preoperative Diagnosis: Right ureter stone. Postoperative Diagnosis:  Same  Procedure(s): Right ureteroscopy with laser lithotripsy and stone removal. Right ureter stent placement (5 x 26 polaris, no tether). Right retrograde pyelogram with interpretation. Cystoscopy.  Estimated blood loss: Minimal  Specimen: Stones sent for analysis at [facility].  Drains: None  Complications: None  Findings: Multiple Randall's plaques. Large ureter stone.  History of present illness: 63 year old male presents today for an obstructing right ureter stone. He was cleared by his cardiologist but he continued his Plavix as planned due to his risk of heart attack. He presents today for ureteroscopy and laser lithotripsy.   Procedure in detail: After informed consent was obtained, the patient was taken to the operating room. They were placed in the supine position. SCDs were turned on and in place. IV antibiotics were infused, and general anesthesia was induced. A timeout was performed in which the correct patient, surgical site, and procedure were identified and agreed upon by the team.  The patient was placed in a dorsolithotomy position, making sure to pad all pertinent neurovascular pressure points. The genitals were prepped and draped in the usual sterile fashion.  A rigid cystoscope was placed into the urethra meatus. This was very narrow but the scope was able to be placed. It was noted to be tight but did not require dilation. This was then advanced through the urethra and into the bladder. The bladder string. He was then fully distended and evaluated in a systematic fashion so that the entire surface of the bladder was visualized. This was negative for bladder tumors.  Attention was turned to the right ureter orifice. 2 sensor wires were placed into the orifice and into the right renal pelvis on  fluoroscopy. A ureter access sheath was then placed over the working wire while the safety wire was secured to the drape. I used an 11-13 ureter access sheath by first placing the obturator over the wire and into the distal ureter where the stone may be located. The stone was not clearly visible on fluoroscopy. This was placed easily and then I placed the access sheath into the distal ureter. The stone was visualized. It was too large to be removed.  Lithotripsy was carried out with a 200  holmium laser filament at the settings of 0.5 J 5 Hz. After the stone was broken smaller pieces the pieces were removed with a grasping basket. I then placed a second sensor wire up the ureter and placed the ureter access sheath into the proximal ureter.  The ureter scope was navigated into the kidney and the calyces were inspected in a systematic fashion to visualize all the calyces. There were multiple Randall's plaques, but no residual stone fragments.  I obtained a retrograde diagram by withdrawing the ureter scope into the proximal ureter and injecting 20 cc of Omnipaque. I then reexplored the renal calyces and there were no other stones.  I withdrew the ureter scope and access sheath. There was noted to be some edema where the stone had been located and therefore elected to leave a ureter stent in place.  There was no damage the mucosa noted.  Cystoscopy was placed over the safety wire and a 5 x 26 Polaris stent without tether was placed over the wire under fluoroscopy. A good curl deployed in the right renal pelvis and the loops were within the bladder. The bladder was then drained and  the cystoscope was removed.  I placed 10 cc of lidocaine jelly into the urethra and placed a belladonna and opium suppository to the rectum. This completed the procedure. The patient was placed back in a supine position and taken to the PAC in stable condition.  All counts were correct at the end of the case.  He will  followup as planned in clinic for stent removal. I will give him Keflex to start the day before stent removal.

## 2013-08-13 NOTE — Transfer of Care (Signed)
Immediate Anesthesia Transfer of Care Note  Patient: Seth Washington  Procedure(s) Performed: Procedure(s): CYSTOSCOPY WITH RETROGRADE PYELOGRAM, URETEROSCOPY AND STENT PLACEMENT (Right) HOLMIUM LASER APPLICATION (Right)  Patient Location: PACU  Anesthesia Type:General  Level of Consciousness: awake, alert , oriented and patient cooperative  Airway & Oxygen Therapy: Patient Spontanous Breathing and Patient connected to face mask oxygen  Post-op Assessment: Report given to PACU RN, Post -op Vital signs reviewed and stable and Patient moving all extremities X 4  Post vital signs: stable  Complications: No apparent anesthesia complications

## 2013-08-13 NOTE — Preoperative (Signed)
Beta Blockers   Reason not to administer Beta Blockers:Not Applicable Patient took Beta Blocker 08-12-13 at 2200.

## 2013-08-14 ENCOUNTER — Encounter (HOSPITAL_COMMUNITY): Payer: Self-pay | Admitting: Urology

## 2013-08-14 DIAGNOSIS — N201 Calculus of ureter: Secondary | ICD-10-CM | POA: Diagnosis not present

## 2013-08-19 DIAGNOSIS — S43429A Sprain of unspecified rotator cuff capsule, initial encounter: Secondary | ICD-10-CM | POA: Diagnosis not present

## 2013-08-25 DIAGNOSIS — S43429A Sprain of unspecified rotator cuff capsule, initial encounter: Secondary | ICD-10-CM | POA: Diagnosis not present

## 2013-08-26 DIAGNOSIS — R82998 Other abnormal findings in urine: Secondary | ICD-10-CM | POA: Diagnosis not present

## 2013-08-26 DIAGNOSIS — N201 Calculus of ureter: Secondary | ICD-10-CM | POA: Diagnosis not present

## 2013-08-26 DIAGNOSIS — N200 Calculus of kidney: Secondary | ICD-10-CM | POA: Diagnosis not present

## 2013-09-19 DIAGNOSIS — H9209 Otalgia, unspecified ear: Secondary | ICD-10-CM | POA: Diagnosis not present

## 2013-09-19 DIAGNOSIS — H60399 Other infective otitis externa, unspecified ear: Secondary | ICD-10-CM | POA: Diagnosis not present

## 2013-10-15 DIAGNOSIS — N200 Calculus of kidney: Secondary | ICD-10-CM | POA: Diagnosis not present

## 2013-10-17 DIAGNOSIS — J019 Acute sinusitis, unspecified: Secondary | ICD-10-CM | POA: Diagnosis not present

## 2013-10-17 DIAGNOSIS — J309 Allergic rhinitis, unspecified: Secondary | ICD-10-CM | POA: Diagnosis not present

## 2013-10-23 DIAGNOSIS — N200 Calculus of kidney: Secondary | ICD-10-CM | POA: Diagnosis not present

## 2013-10-23 DIAGNOSIS — R351 Nocturia: Secondary | ICD-10-CM | POA: Diagnosis not present

## 2013-10-23 DIAGNOSIS — N400 Enlarged prostate without lower urinary tract symptoms: Secondary | ICD-10-CM | POA: Diagnosis not present

## 2013-10-23 DIAGNOSIS — R3915 Urgency of urination: Secondary | ICD-10-CM | POA: Diagnosis not present

## 2013-10-28 DIAGNOSIS — R82998 Other abnormal findings in urine: Secondary | ICD-10-CM | POA: Diagnosis not present

## 2013-10-28 DIAGNOSIS — N400 Enlarged prostate without lower urinary tract symptoms: Secondary | ICD-10-CM | POA: Diagnosis not present

## 2013-10-28 DIAGNOSIS — N200 Calculus of kidney: Secondary | ICD-10-CM | POA: Diagnosis not present

## 2013-10-31 IMAGING — CR DG CHEST 2V
2 series · 2 of 2 positions shown · non-contrast
Comparison: 06/03/2012

CLINICAL DATA: Cough, congestion.

CHEST - 2 VIEW

[w chest pa]
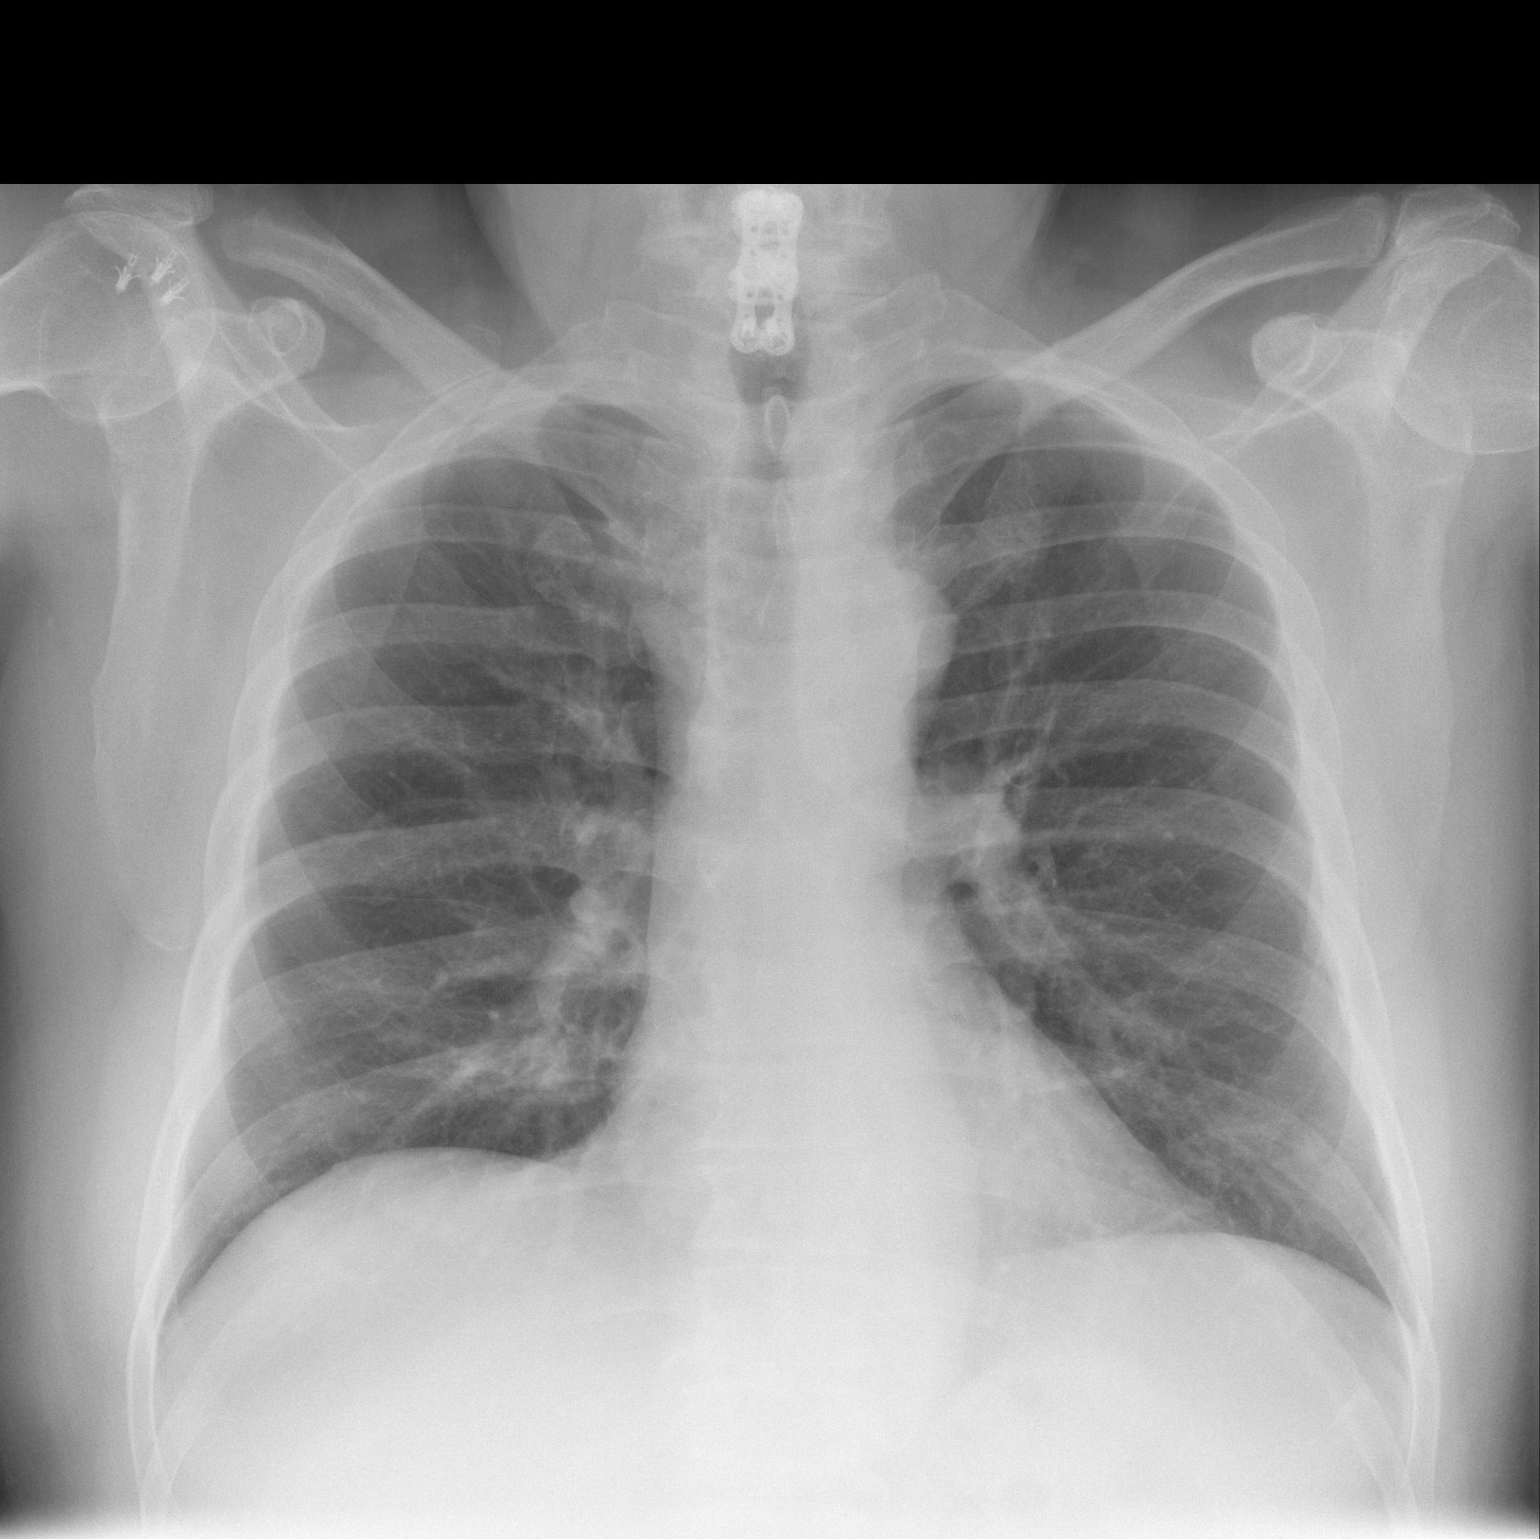

[w chest lat]
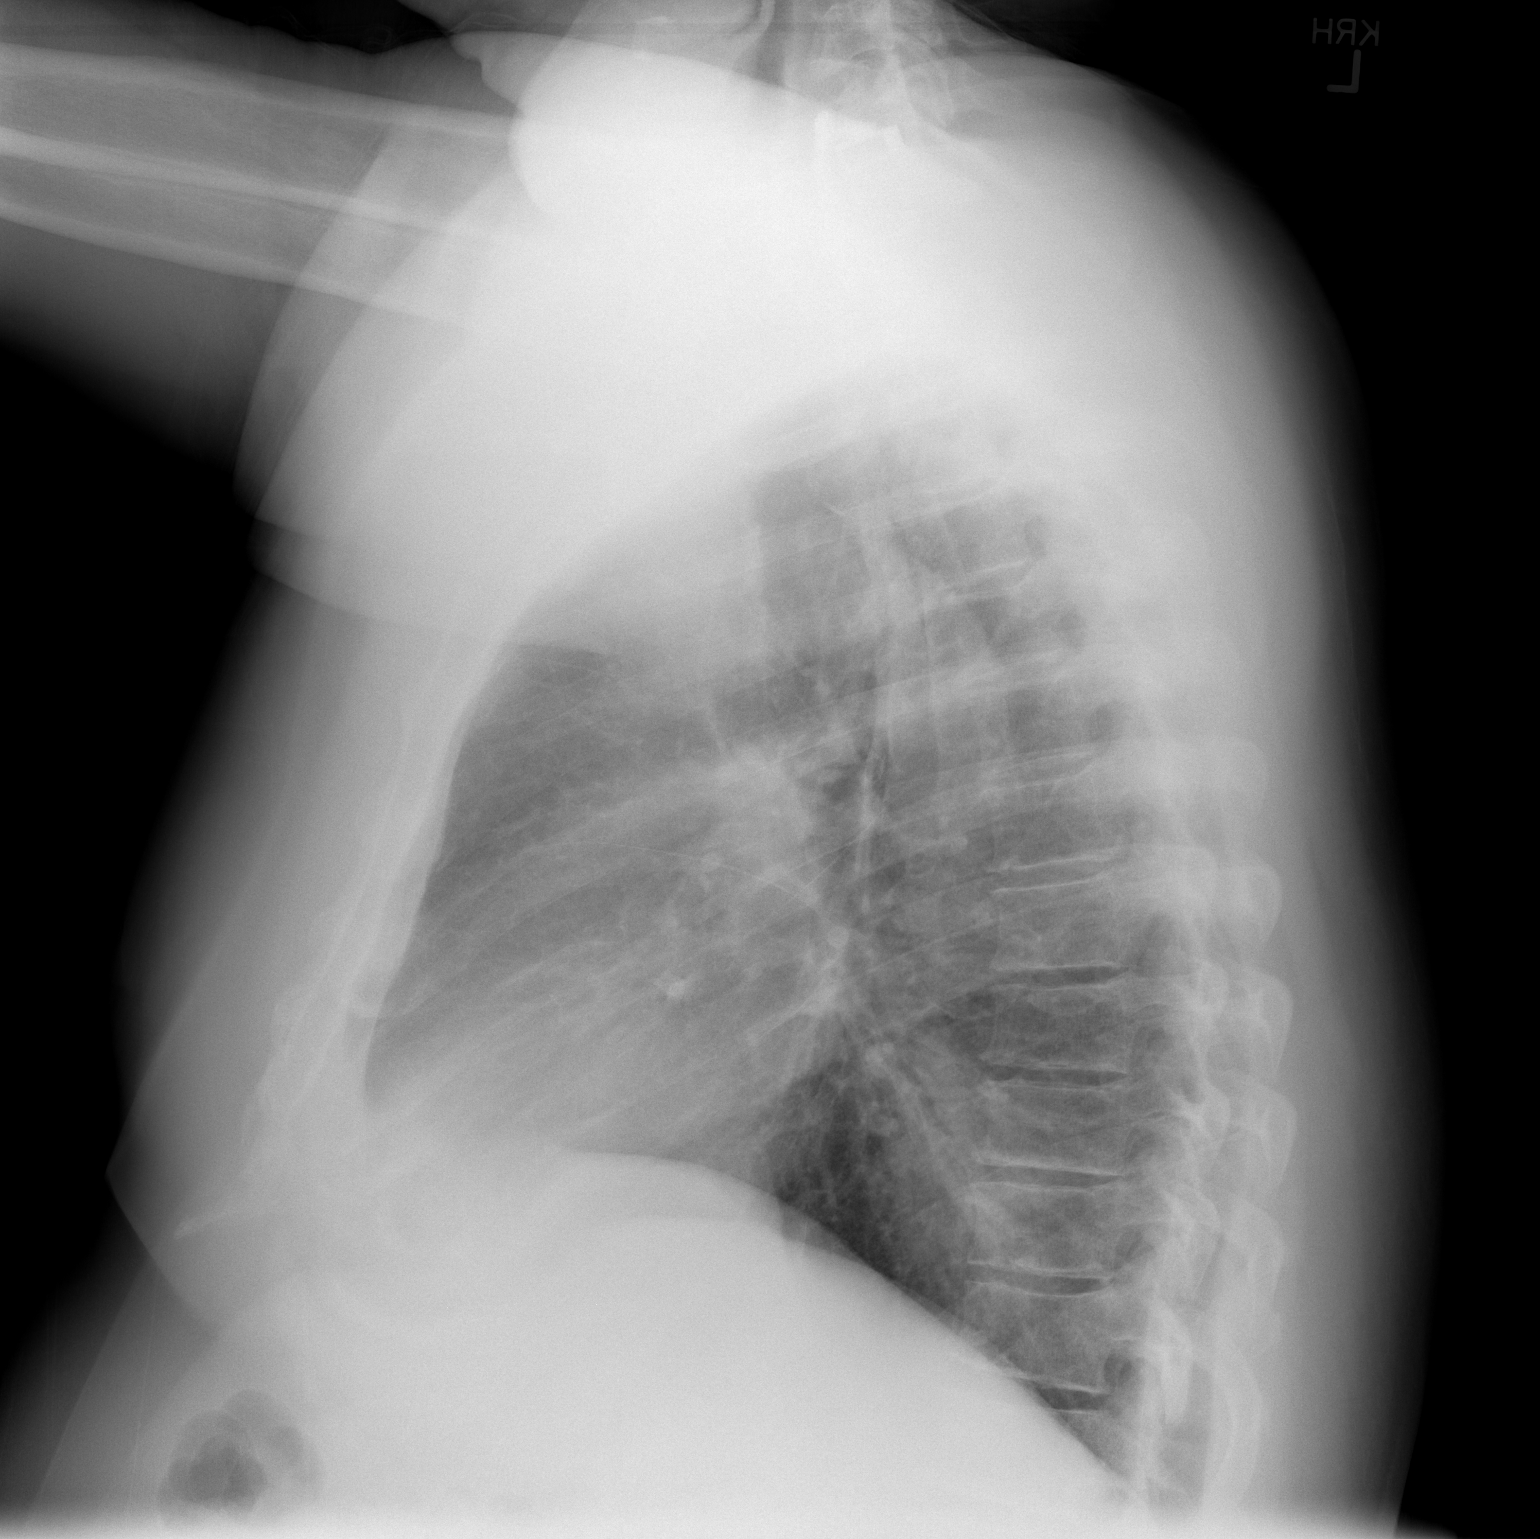

[2 of 2 positions shown; findings below may reference images not displayed]

FINDINGS: Heart and mediastinal contours are within normal limits.
No focal opacities or effusions.  No acute bony abnormality.
IMPRESSION: No active cardiopulmonary disease.

## 2013-11-11 DIAGNOSIS — L570 Actinic keratosis: Secondary | ICD-10-CM | POA: Diagnosis not present

## 2013-11-24 ENCOUNTER — Telehealth: Payer: Self-pay | Admitting: Cardiology

## 2013-11-24 NOTE — Telephone Encounter (Signed)
ERROR// WRONG PT// CLOSING MESSSAGE//SR

## 2013-11-24 NOTE — Telephone Encounter (Signed)
New Message  Pt wife called states that the pt believes that he has experienced a slight heart attack.Seth Washington left arm went numb with chest pains over the weekend. No current symptoms. Requests a call back to discuss.

## 2013-11-28 DIAGNOSIS — M25569 Pain in unspecified knee: Secondary | ICD-10-CM | POA: Diagnosis not present

## 2013-11-28 DIAGNOSIS — M25869 Other specified joint disorders, unspecified knee: Secondary | ICD-10-CM | POA: Diagnosis not present

## 2013-12-24 ENCOUNTER — Telehealth: Payer: Self-pay | Admitting: Cardiology

## 2013-12-24 NOTE — Telephone Encounter (Signed)
Pt denied any Chest pain. I will print and Forward to Dr Honor Loh Office.

## 2013-12-24 NOTE — Telephone Encounter (Signed)
Pts Surgery will be done at [facility] with Dr Paralee Cancel. Phone: [phone] fax: [phone]

## 2013-12-24 NOTE — Telephone Encounter (Signed)
The patient needs preop for knee surgery. He has moderate obstructive ASCAD.  His last PCI was in 2012 to the PL branch of the RCA.  His last nuclear medicine study 08/2013 was a Low risk study demonstrating a moderate region of inferior scar with minimal peri-infarct ischemia. LV Wall Motion: NL LV Function, EF 58%; NL Wall Motion. Patient low to moderate risk from cardiac standpoint for surgery. Given known underlying CAD would use intraoperative NTG to promote coronary perfusion and avoid hypotension. OK to be off plavix for 7 days prior to procedure.  Please call patient to verify that he has not had any chest pain since I last saw him in January.  If no chest pain then ok to proceed with surgery.

## 2014-01-12 DIAGNOSIS — L820 Inflamed seborrheic keratosis: Secondary | ICD-10-CM | POA: Diagnosis not present

## 2014-01-12 DIAGNOSIS — L570 Actinic keratosis: Secondary | ICD-10-CM | POA: Diagnosis not present

## 2014-01-12 DIAGNOSIS — D485 Neoplasm of uncertain behavior of skin: Secondary | ICD-10-CM | POA: Diagnosis not present

## 2014-01-12 DIAGNOSIS — D0430 Carcinoma in situ of skin of unspecified part of face: Secondary | ICD-10-CM | POA: Diagnosis not present

## 2014-01-12 DIAGNOSIS — D0439 Carcinoma in situ of skin of other parts of face: Secondary | ICD-10-CM | POA: Diagnosis not present

## 2014-02-05 ENCOUNTER — Encounter (HOSPITAL_COMMUNITY): Payer: Self-pay | Admitting: Pharmacy Technician

## 2014-02-10 ENCOUNTER — Ambulatory Visit (INDEPENDENT_AMBULATORY_CARE_PROVIDER_SITE_OTHER): Payer: Medicare Other | Admitting: Cardiology

## 2014-02-10 VITALS — BP 120/72 | HR 56 | Ht 70.0 in | Wt 227.0 lb

## 2014-02-10 DIAGNOSIS — E7800 Pure hypercholesterolemia, unspecified: Secondary | ICD-10-CM | POA: Diagnosis not present

## 2014-02-10 DIAGNOSIS — G4733 Obstructive sleep apnea (adult) (pediatric): Secondary | ICD-10-CM

## 2014-02-10 DIAGNOSIS — I10 Essential (primary) hypertension: Secondary | ICD-10-CM

## 2014-02-10 DIAGNOSIS — I2510 Atherosclerotic heart disease of native coronary artery without angina pectoris: Secondary | ICD-10-CM

## 2014-02-10 DIAGNOSIS — I420 Dilated cardiomyopathy: Secondary | ICD-10-CM

## 2014-02-10 DIAGNOSIS — I428 Other cardiomyopathies: Secondary | ICD-10-CM

## 2014-02-10 MED ORDER — LOSARTAN POTASSIUM 25 MG PO TABS: 12.5000 mg | ORAL_TABLET | Freq: Every day | ORAL | Status: DC

## 2014-02-10 NOTE — Progress Notes (Signed)
[place], [place],   [address] Phone: [phone] Fax:  [phone]  Date:  02/10/2014   ID:  Seth Washington, DOB 1950-10-26, MRN [id number]  PCP:  Osborne Casco, MD  Cardiologist:  Fransico Him, MD     History of Present Illness: Seth Washington is a 63 y.o. male with a history of ASCAD, dyslipidemia, OSA on CPAP, obesity and HTN who presents today for followup. He is doing well. He denies any chest pain, LE edema, palpitations or syncope. He tolerates his CPAP well. He tolerates the full face mask and feels the pressure is adequate.  He will feel sleepy during the day but does not have to take a nap. He goes to bed at 10-12MN and gets up at 7:30am.  He wakes up at night to go to the bathroom and due to chronic pain.  He had chronic DOE when going up stairs that is unchanged. He has not been getting any aerobic exercise and has gained weight and has chronic knee pain.      Wt Readings from Last 3 Encounters:  02/10/14 227 lb (102.967 kg)  08/11/13 230 lb (104.327 kg)  08/06/13 235 lb (106.595 kg)     Past Medical History  Diagnosis Date  . Mental disorder     PTSD  . Hypertension   . Sleep apnea     uses cpap nightly  . Hyperlipidemia   . BPH (benign prostatic hyperplasia)   . Myocardial infarction     stent placed-2009 adn 2006 hx of mi x 3   . GERD (gastroesophageal reflux disease)   . Arthritis     generalized   . Complication of anesthesia     HEART ATTACK IN THE RECOVERY ROOM AFTER NECK SURGERY  . Diabetes mellitus     type 2 on pills   . Anxiety   . OSA (obstructive sleep apnea)   . Glaucoma   . PUD (peptic ulcer disease)   . PTSD (post-traumatic stress disorder)   . Obesity   . Aortic valve sclerosis     Mild no stenosis  . Coronary artery disease     PCI LAD 10/03, NSTEMI with PCI PDA 2/09, PCI of left circ 5/09, STEMI in setting of post op from spine surgery with nonobstructive thrombus in the diagonal and 70% LAD secondary  to spasm with no PCI due to spinal surgery    Current Outpatient Prescriptions  Medication Sig Dispense Refill  . aspirin 81 MG tablet Take 81 mg by mouth at bedtime. Instructed to stay on per T.Robyn Nohr MD,and Dr [name]      . atorvastatin (LIPITOR) 40 MG tablet Take 40 mg by mouth at bedtime.      . clopidogrel (PLAVIX) 75 MG tablet Take 75 mg by mouth at bedtime.       Marland Kitchen ezetimibe (ZETIA) 10 MG tablet Take 10 mg by mouth at bedtime.       . fluticasone (FLONASE) 50 MCG/ACT nasal spray Place 1 spray into both nostrils daily as needed for allergies.       . metoprolol (LOPRESSOR) 50 MG tablet Take 50 mg by mouth at bedtime.       . nitroGLYCERIN (NITROSTAT) 0.4 MG SL tablet Place 0.4 mg under the tongue every 5 (five) minutes as needed for chest pain.      Marland Kitchen omeprazole (PRILOSEC) 20 MG capsule Take 20 mg by mouth daily.      . QUEtiapine (SEROQUEL) 25  MG tablet Take 25 mg by mouth at bedtime.       . saxagliptin HCl (ONGLYZA) 5 MG TABS tablet Take 5 mg by mouth at bedtime.       . sertraline (ZOLOFT) 50 MG tablet Take 100 mg by mouth at bedtime.       . traZODone (DESYREL) 150 MG tablet Take 150 mg by mouth at bedtime.       . cyclobenzaprine (FLEXERIL) 10 MG tablet Take 10 mg by mouth 3 (three) times daily as needed for muscle spasms.      . hyoscyamine (LEVSIN, ANASPAZ) 0.125 MG tablet Take 1 tablet (0.125 mg total) by mouth every 4 (four) hours as needed (bladder spasms).  40 tablet  4   No current facility-administered medications for this visit.    Allergies:    Allergies  Allergen Reactions  . Ace Inhibitors Cough  . Metformin And Related Diarrhea    Severe diarrhea  . Niacin And Related Other (See Comments)    body flushed, makes body feel like its on fire    Social History:  The patient  reports that he quit smoking about 6 years ago. He has never used smokeless tobacco. He reports that he does not drink alcohol or use illicit drugs.   Family History:  The patient's family  history is not on file.   ROS:  Please see the history of present illness.      All other systems reviewed and negative.   PHYSICAL EXAM: VS:  BP 120/72  Pulse 56  Ht 5\' 10"  (1.778 m)  Wt 227 lb (102.967 kg)  BMI 32.57 kg/m2 Well nourished, well developed, in no acute distress HEENT: normal Neck: no JVD Cardiac:  normal S1, S2; RRR; no murmur Lungs:  clear to auscultation bilaterally, no wheezing, rhonchi or rales Abd: soft, nontender, no hepatomegaly Ext: no edema Skin: warm and dry Neuro:  CNs 2-12 intact, no focal abnormalities noted  ASSESSMENT and PLAN:  1. ASCAD with no angina - continue ASA/Plavix  2. HTN - well controlled - continue metoprolol  - check BMET 3. Dyslipidemia - LDL 50 on lipid check 10/14 - continue Atorvastatin/Zetia  - check FLP and ALT 4. OSA on CPAP and tolerating well but has some non restorative sleep and daytime sleepiness - I will get a download from his DME 5. Obesity - I have encouraged him to get back into a routine exercise program as his knee will tolerate       7.    Heart murmur - secondary to AV sclerosis       8.    Mild LV dysfunction EF 40-45% by echo with minimal schemia on last stress myoview.  He has no anginal symptoms.  He appears euvolemic on exam.  He has normal renal function.  He has had a cough with ACE I in the past so I will start him on Losartan 12.5mg  daily.  He will check his BP daily for a week and call with the results.  Followup with me in 6 months      Signed, Fransico Him, MD 02/10/2014 11:15 AM

## 2014-02-10 NOTE — Patient Instructions (Signed)
Your physician has recommended you make the following change in your medication: 1. Start Losartan 25 MG 1/2 tablet daily  Your physician has requested that you regularly monitor and record your blood pressure readings at home. Please use the same machine at the same time of day to check your readings and record them for one week and call us with the results  Your physician recommends that you return for a FASTING lipid profile, ALT, and BMET. Please schedule before leaving today  Your physician wants you to follow-up in: 6 months with Dr Mallie Snooks will receive a reminder letter in the mail two months in advance. If you don't receive a letter, please call our office to schedule the follow-up appointment.

## 2014-02-11 ENCOUNTER — Encounter (HOSPITAL_COMMUNITY): Payer: Self-pay

## 2014-02-11 ENCOUNTER — Encounter (HOSPITAL_COMMUNITY)
Admission: RE | Admit: 2014-02-11 | Discharge: 2014-02-11 | Disposition: A | Discharge status: Home / Self Care | Payer: Medicare Other | Source: Ambulatory Visit | Attending: Orthopedic Surgery | Admitting: Orthopedic Surgery

## 2014-02-11 ENCOUNTER — Other Ambulatory Visit (INDEPENDENT_AMBULATORY_CARE_PROVIDER_SITE_OTHER): Payer: Medicare Other

## 2014-02-11 DIAGNOSIS — Z01810 Encounter for preprocedural cardiovascular examination: Secondary | ICD-10-CM | POA: Insufficient documentation

## 2014-02-11 DIAGNOSIS — E7800 Pure hypercholesterolemia, unspecified: Secondary | ICD-10-CM | POA: Diagnosis not present

## 2014-02-11 DIAGNOSIS — X58XXXA Exposure to other specified factors, initial encounter: Secondary | ICD-10-CM | POA: Diagnosis not present

## 2014-02-11 DIAGNOSIS — Z96659 Presence of unspecified artificial knee joint: Secondary | ICD-10-CM | POA: Insufficient documentation

## 2014-02-11 DIAGNOSIS — I10 Essential (primary) hypertension: Secondary | ICD-10-CM | POA: Insufficient documentation

## 2014-02-11 DIAGNOSIS — Z7982 Long term (current) use of aspirin: Secondary | ICD-10-CM | POA: Diagnosis not present

## 2014-02-11 DIAGNOSIS — T84029A Dislocation of unspecified internal joint prosthesis, initial encounter: Secondary | ICD-10-CM | POA: Diagnosis not present

## 2014-02-11 DIAGNOSIS — Z7902 Long term (current) use of antithrombotics/antiplatelets: Secondary | ICD-10-CM | POA: Diagnosis not present

## 2014-02-11 DIAGNOSIS — Z01812 Encounter for preprocedural laboratory examination: Secondary | ICD-10-CM | POA: Diagnosis not present

## 2014-02-11 HISTORY — DX: Personal history of urinary calculi: Z87.442

## 2014-02-11 HISTORY — DX: Pain, unspecified: R52

## 2014-02-11 LAB — URINALYSIS, ROUTINE W REFLEX MICROSCOPIC
Bilirubin Urine: NEGATIVE
Glucose, UA: >1000 mg/dL — AB
Hgb urine dipstick: NEGATIVE
Ketones, ur: NEGATIVE mg/dL
LEUKOCYTES UA: NEGATIVE
NITRITE: NEGATIVE
PH: 6 (ref 5.0–8.0)
Protein, ur: NEGATIVE mg/dL
Specific Gravity, Urine: 1.03 (ref 1.005–1.030)
Urobilinogen, UA: 1 mg/dL (ref 0.0–1.0)

## 2014-02-11 LAB — BASIC METABOLIC PANEL
BUN: 14 mg/dL (ref 6–23)
CALCIUM: 8.8 mg/dL (ref 8.4–10.5)
CHLORIDE: 99 meq/L (ref 96–112)
CO2: 27 mEq/L (ref 19–32)
Creatinine, Ser: 1 mg/dL (ref 0.4–1.5)
GFR: 82.31 mL/min (ref >60.00)
Glucose, Bld: 274 mg/dL — ABNORMAL HIGH (ref 70–99)
Potassium: 4.1 mEq/L (ref 3.5–5.1)
Sodium: 133 mEq/L — ABNORMAL LOW (ref 135–145)

## 2014-02-11 LAB — SURGICAL PCR SCREEN
MRSA, PCR: NEGATIVE
STAPHYLOCOCCUS AUREUS: POSITIVE — AB

## 2014-02-11 LAB — CBC
HEMATOCRIT: 41.3 % (ref 39.0–52.0)
HEMOGLOBIN: 14.6 g/dL (ref 13.0–17.0)
MCH: 31.1 pg (ref 26.0–34.0)
MCHC: 35.4 g/dL (ref 30.0–36.0)
MCV: 87.9 fL (ref 78.0–100.0)
Platelets: 145 10*3/uL — ABNORMAL LOW (ref 150–400)
RBC: 4.7 MIL/uL (ref 4.22–5.81)
RDW: 12.1 % (ref 11.5–15.5)
WBC: 7.3 10*3/uL (ref 4.0–10.5)

## 2014-02-11 LAB — URINE MICROSCOPIC-ADD ON: Urine-Other: NONE SEEN

## 2014-02-11 LAB — LIPID PANEL
Cholesterol: 126 mg/dL (ref 0–200)
HDL: 31.9 mg/dL — ABNORMAL LOW (ref >39.00)
LDL Cholesterol: 58 mg/dL (ref 0–99)
NONHDL: 94.1
Total CHOL/HDL Ratio: 4
Triglycerides: 181 mg/dL — ABNORMAL HIGH (ref 0.0–149.0)
VLDL: 36.2 mg/dL (ref 0.0–40.0)

## 2014-02-11 LAB — PROTIME-INR
INR: 1.03 (ref 0.00–1.49)
Prothrombin Time: 13.5 seconds (ref 11.6–15.2)

## 2014-02-11 LAB — ALT: ALT: 36 U/L (ref 0–53)

## 2014-02-11 LAB — APTT: aPTT: 26 seconds (ref 24–37)

## 2014-02-11 NOTE — Patient Instructions (Addendum)
YOUR SURGERY IS SCHEDULED AT [facility]  ON:   Monday  9/14  REPORT TO  SHORT STAY CENTER AT:  5:30 AM   PLEASE COME IN THE [place] ENTRANCE AND FOLLOW SIGNS TO SHORT STAY CENTER.  DO NOT EAT OR DRINK ANYTHING AFTER MIDNIGHT THE NIGHT BEFORE YOUR SURGERY.  YOU MAY BRUSH YOUR TEETH, RINSE OUT YOUR MOUTH--BUT NO WATER, NO FOOD, NO CHEWING GUM, NO MINTS, NO CANDIES, NO CHEWING TOBACCO.  PLEASE TAKE THE FOLLOWING MEDICATIONS THE AM OF YOUR SURGERY WITH A FEW SIPS OF WATER:  NO MEDICATIONS TO TAKE   IF YOU ARE DIABETIC:  DO NOT TAKE ANY DIABETIC MEDICATIONS THE AM OF YOUR SURGERY.    IF YOU HAVE SLEEP APNEA AND USE CPAP OR BIPAP--PLEASE BRING THE MASK AND THE TUBING.  DO NOT BRING YOUR MACHINE.  DO NOT BRING VALUABLES, MONEY, CREDIT CARDS.  DO NOT WEAR JEWELRY, MAKE-UP, NAIL POLISH AND NO METAL PINS OR CLIPS IN YOUR HAIR. CONTACT LENS, DENTURES / PARTIALS, GLASSES SHOULD NOT BE WORN TO SURGERY AND IN MOST CASES-HEARING AIDS WILL NEED TO BE REMOVED.  BRING YOUR GLASSES CASE, ANY EQUIPMENT NEEDED FOR YOUR CONTACT LENS. FOR PATIENTS ADMITTED TO THE HOSPITAL--CHECK OUT TIME THE DAY OF DISCHARGE IS 11:00 AM.  ALL INPATIENT ROOMS ARE PRIVATE - WITH BATHROOM, TELEPHONE, TELEVISION AND WIFI INTERNET.                                                    PLEASE READ OVER ANY  FACT SHEETS THAT YOU WERE GIVEN: MRSA INFORMATION, BLOOD TRANSFUSION INFORMATION, INCENTIVE SPIROMETER INFORMATION.  PLEASE BE AWARE THAT YOU MAY NEED ADDITIONAL BLOOD DRAWN DAY OF YOUR SURGERY  _______________________________________________________________________   [facility] - Preparing for Surgery Before surgery, you can play an important role.  Because skin is not sterile, your skin needs to be as free of germs as possible.  You can reduce the number of germs on your skin by washing with CHG (chlorahexidine gluconate) soap before surgery.  CHG is an antiseptic cleaner which kills germs and  bonds with the skin to continue killing germs even after washing. Please DO NOT use if you have an allergy to CHG or antibacterial soaps.  If your skin becomes reddened/irritated stop using the CHG and inform your nurse when you arrive at Short Stay. Do not shave (including legs and underarms) for at least 48 hours prior to the first CHG shower.  You may shave your face/neck. Please follow these instructions carefully:  1.  Shower with CHG Soap the night before surgery and the  morning of Surgery.  2.  If you choose to wash your hair, wash your hair first as usual with your  normal  shampoo.  3.  After you shampoo, rinse your hair and body thoroughly to remove the  shampoo.                           4.  Use CHG as you would any other liquid soap.  You can apply chg directly  to the skin and wash                       Gently with a scrungie or clean washcloth.  5.  Apply the CHG  Soap to your body ONLY FROM THE NECK DOWN.   Do not use on face/ open                           Wound or open sores. Avoid contact with eyes, ears mouth and genitals (private parts).                       Wash face,  Genitals (private parts) with your normal soap.             6.  Wash thoroughly, paying special attention to the area where your surgery  will be performed.  7.  Thoroughly rinse your body with warm water from the neck down.  8.  DO NOT shower/wash with your normal soap after using and rinsing off  the CHG Soap.                9.  Pat yourself dry with a clean towel.            10.  Wear clean pajamas.            11.  Place clean sheets on your bed the night of your first shower and do not  sleep with pets. Day of Surgery : Do not apply any lotions/deodorants the morning of surgery.  Please wear clean clothes to the hospital/surgery center.  FAILURE TO FOLLOW THESE INSTRUCTIONS MAY RESULT IN THE CANCELLATION OF YOUR SURGERY PATIENT SIGNATURE_________________________________  NURSE  SIGNATURE__________________________________  ________________________________________________________________________   Seth Washington  An incentive spirometer is a tool that can help keep your lungs clear and active. This tool measures how well you are filling your lungs with each breath. Taking long deep breaths may help reverse or decrease the chance of developing breathing (pulmonary) problems (especially infection) following:  A long period of time when you are unable to move or be active. BEFORE THE PROCEDURE   If the spirometer includes an indicator to show your best effort, your nurse or respiratory therapist will set it to a desired goal.  If possible, sit up straight or lean slightly forward. Try not to slouch.  Hold the incentive spirometer in an upright position. INSTRUCTIONS FOR USE  1. Sit on the edge of your bed if possible, or sit up as far as you can in bed or on a chair. 2. Hold the incentive spirometer in an upright position. 3. Breathe out normally. 4. Place the mouthpiece in your mouth and seal your lips tightly around it. 5. Breathe in slowly and as deeply as possible, raising the piston or the ball toward the top of the column. 6. Hold your breath for 3-5 seconds or for as long as possible. Allow the piston or ball to fall to the bottom of the column. 7. Remove the mouthpiece from your mouth and breathe out normally. 8. Rest for a few seconds and repeat Steps 1 through 7 at least 10 times every 1-2 hours when you are awake. Take your time and take a few normal breaths between deep breaths. 9. The spirometer may include an indicator to show your best effort. Use the indicator as a goal to work toward during each repetition. 10. After each set of 10 deep breaths, practice coughing to be sure your lungs are clear. If you have an incision (the cut made at the time of surgery), support your incision when coughing by placing a pillow or rolled up towels  firmly  against it. Once you are able to get out of bed, walk around indoors and cough well. You may stop using the incentive spirometer when instructed by your caregiver.  RISKS AND COMPLICATIONS  Take your time so you do not get dizzy or light-headed.  If you are in pain, you may need to take or ask for pain medication before doing incentive spirometry. It is harder to take a deep breath if you are having pain. AFTER USE  Rest and breathe slowly and easily.  It can be helpful to keep track of a log of your progress. Your caregiver can provide you with a simple table to help with this. If you are using the spirometer at home, follow these instructions: [place] IF:   You are having difficultly using the spirometer.  You have trouble using the spirometer as often as instructed.  Your pain medication is not giving enough relief while using the spirometer.  You develop fever of 100.5 F (38.1 C) or higher. SEEK IMMEDIATE MEDICAL CARE IF:   You cough up bloody sputum that had not been present before.  You develop fever of 102 F (38.9 C) or greater.  You develop worsening pain at or near the incision site. MAKE SURE YOU:   Understand these instructions.  Will watch your condition.  Will get help right away if you are not doing well or get worse. Document Released: 10/02/2006 Document Revised: 08/14/2011 Document Reviewed: 12/03/2006 Exit[employer] Patient Information 2014 [employer], [place].   ________________________________________________________________________  WHAT IS A BLOOD TRANSFUSION? Blood Transfusion Information  A transfusion is the replacement of blood or some of its parts. Blood is made up of multiple cells which provide different functions.  Red blood cells carry oxygen and are used for blood loss replacement.  White blood cells fight against infection.  Platelets control bleeding.  Plasma helps clot blood.  Other blood products are available for  specialized needs, such as hemophilia or other clotting disorders. BEFORE THE TRANSFUSION  Who gives blood for transfusions?   Healthy volunteers who are fully evaluated to make sure their blood is safe. This is blood bank blood. Transfusion therapy is the safest it has ever been in the practice of medicine. Before blood is taken from a donor, a complete history is taken to make sure that person has no history of diseases nor engages in risky social behavior (examples are intravenous drug use or sexual activity with multiple partners). The donor's travel history is screened to minimize risk of transmitting infections, such as malaria. The donated blood is tested for signs of infectious diseases, such as HIV and hepatitis. The blood is then tested to be sure it is compatible with you in order to minimize the chance of a transfusion reaction. If you or a relative donates blood, this is often done in anticipation of surgery and is not appropriate for emergency situations. It takes many days to process the donated blood. RISKS AND COMPLICATIONS Although transfusion therapy is very safe and saves many lives, the main dangers of transfusion include:   Getting an infectious disease.  Developing a transfusion reaction. This is an allergic reaction to something in the blood you were given. Every precaution is taken to prevent this. The decision to have a blood transfusion has been considered carefully by your caregiver before blood is given. Blood is not given unless the benefits outweigh the risks. AFTER THE TRANSFUSION  Right after receiving a blood transfusion, you will usually feel much better  and more energetic. This is especially true if your red blood cells have gotten low (anemic). The transfusion raises the level of the red blood cells which carry oxygen, and this usually causes an energy increase.  The nurse administering the transfusion will monitor you carefully for complications. HOME CARE  INSTRUCTIONS  No special instructions are needed after a transfusion. You may find your energy is better. Speak with your caregiver about any limitations on activity for underlying diseases you may have. SEEK MEDICAL CARE IF:   Your condition is not improving after your transfusion.  You develop redness or irritation at the intravenous (IV) site. SEEK IMMEDIATE MEDICAL CARE IF:  Any of the following symptoms occur over the next 12 hours:  Shaking chills.  You have a temperature by mouth above 102 F (38.9 C), not controlled by medicine.  Chest, back, or muscle pain.  People around you feel you are not acting correctly or are confused.  Shortness of breath or difficulty breathing.  Dizziness and fainting.  You get a rash or develop hives.  You have a decrease in urine output.  Your urine turns a dark color or changes to pink, red, or brown. Any of the following symptoms occur over the next 10 days:  You have a temperature by mouth above 102 F (38.9 C), not controlled by medicine.  Shortness of breath.  Weakness after normal activity.  The white part of the eye turns yellow (jaundice).  You have a decrease in the amount of urine or are urinating less often.  Your urine turns a dark color or changes to pink, red, or brown. Document Released: 05/19/2000 Document Revised: 08/14/2011 Document Reviewed: 01/06/2008 [facility] Patient Information 2014 [place], [place].  _______________________________________________________________________

## 2014-02-11 NOTE — Pre-Procedure Instructions (Addendum)
PT HAS NOTE OF CLEARANCE FOR SURGERY ON HIS CHART - FAXED BY DR. Aurea Graff OFFICE - FROM CARDIOLOGIST DR. Fransico Him.  PT STATES HE IS DOING WELL - NO CHEST PAINS. THERE IS A STRESS TEST REPORT IN EPIC FROM 08-06-13, 2 D ECHO REPORT IN EPIC FROM 07-15-13.   THERE IS NO EKG REPORT IN EPIC.  EKG WAS DONE TODAY PREOP AT [facility]. CXR REPORT 08-13-13 IS IN EPIC. PT STATED HE HAD SOME LABS DRAWN THIS AM AT [place] OFFICE.  BMET WAS DONE - REPORT IN EPIC - BMET WILL NOT BE REPEATED TODAY - BUT DID THE OTHER LABS ORDERED BY DR. Alvan Dame FOR SURGERY TODAY AT [facility].

## 2014-02-11 NOTE — H&P (Signed)
REVISION UKR ADMISSION H&P  Patient is being admitted for right knee incisional debridement and possibly poly exchange.  Subjective:  Chief Complaint:   Right knee pain S/P right medial UKR  HPI: Seth Washington, 63 y.o. male, has a history of pain and functional disability in the right knee(s) due to excessive scar and symptomatic catching and patient has failed non-surgical conservative treatments for greater than 12 weeks to include NSAID's and/or analgesics, corticosteriod injections, supervised PT with diminished ADL's post treatment, use of assistive devices and activity modification. The indications for the incisional debridement and poly exchange of the UKR are pain, excessive scarring and symptomatic catching. Onset of symptoms was gradual starting 1+ years ago with gradually worsening course since that time.  Prior procedures on the right knee(s) include unicompartmental arthroplasty.  Patient currently rates pain in the right knee(s) at 8 out of 10 when the knee catched. There is worsening of pain with activity and weight bearing, pain that interferes with activities of daily living, pain with passive range of motion, crepitus and joint swelling.  Patient has evidence of previous UKR by imaging studies. This condition presents safety issues increasing the risk of falls.  There is no current active infection.  Risks, benefits and expectations were discussed with the patient.  Risks including but not limited to the risk of anesthesia, blood clots, nerve damage, blood vessel damage, failure of the prosthesis, infection and up to and including death.  Patient understand the risks, benefits and expectations and wishes to proceed with surgery.   PCP: Osborne Casco, MD  D/C Plans:      Home with HHPT  Post-op Meds:       No Rx given  Tranexamic Acid:      To be given - topically  Decadron:      Is to be given  FYI:     Plavix and ASA, resume post-op  Oxycodone post-op  CPAP to be  ordered    Patient Active Problem List   Diagnosis Date Noted  . DCM (dilated cardiomyopathy) 02/10/2014  . Heart murmur 06/20/2013  . Vertigo 06/20/2013  . Coronary artery disease   . Coronary atherosclerosis of native coronary artery 06/03/2013  . Essential hypertension, benign 06/03/2013  . Obstructive sleep apnea (adult) (pediatric) 06/03/2013  . Pure hypercholesterolemia 06/03/2013  . Obese 06/11/2012  . Hyponatremia 06/11/2012  . S/P right UKA 06/10/2012   Past Medical History  Diagnosis Date  . Mental disorder     PTSD  . Hypertension   . Sleep apnea     uses cpap nightly  . Hyperlipidemia   . BPH (benign prostatic hyperplasia)   . Myocardial infarction     stent placed-2009 adn 2006 hx of mi x 3   . GERD (gastroesophageal reflux disease)   . Complication of anesthesia     HEART ATTACK IN THE RECOVERY ROOM AFTER NECK SURGERY  . Diabetes mellitus     type 2 on pills   . Anxiety   . OSA (obstructive sleep apnea)   . Glaucoma   . PUD (peptic ulcer disease)   . PTSD (post-traumatic stress disorder)   . Obesity   . Aortic valve sclerosis     Mild no stenosis  . Coronary artery disease     PCI LAD 10/03, NSTEMI with PCI PDA 2/09, PCI of left circ 5/09, STEMI in setting of post op from spine surgery with nonobstructive thrombus in the diagonal and 70% LAD secondary to spasm with no  PCI due to spinal surgery  . History of kidney stones   . Pain     LEFT SHOULDER PAIN - TORN ROTATOR CUFF - PT STATES SURGERY PLANNED IN OCT 2015  . Pain     RIGHT KNEE PAIN - S/P RIGHT UNI KNEE 06-10-12  . Arthritis     generalized   DDD    Past Surgical History  Procedure Laterality Date  . Cardiac catheterization      4/11 ,PTCA w/stent '03,'09  . Cervical discectomy  4/11    FUSION  . Arthroscopy  rt shoulder-2007  . Knee arthroscopy  1970's  . Coronary angioplasty      stents in 2006 and 2009   . Right shoulder subacromial decompression  05/23/11    . Knee arthroscopy with  lateral menisectomy  06/10/2012    Procedure: KNEE ARTHROSCOPY WITH LATERAL MENISECTOMY;  Surgeon: [name], MD;  Location: [facility];  Service: Orthopedics;  Laterality: Right;  . Partial knee arthroplasty  06/10/2012    Procedure: UNICOMPARTMENTAL KNEE;  Surgeon: [name], MD;  Location: [facility];  Service: Orthopedics;  Laterality: Right;  Right Medial Uni Knee  . Cystoscopy with retrograde pyelogram, ureteroscopy and stent placement Right 08/13/2013    Procedure: CYSTOSCOPY WITH RETROGRADE PYELOGRAM, URETEROSCOPY AND STENT PLACEMENT;  Surgeon: [name], MD;  Location: [facility];  Service: Urology;  Laterality: Right;  . Holmium laser application Right 2/44/0102    Procedure: HOLMIUM LASER APPLICATION;  Surgeon: [name], MD;  Location: [facility];  Service: Urology;  Laterality: Right;    No prescriptions prior to admission   Allergies  Allergen Reactions  . Ace Inhibitors Cough  . Metformin And Related Diarrhea    Severe diarrhea  . Niacin And Related Other (See Comments)    body flushed, makes body feel like its on fire    History  Substance Use Topics  . Smoking status: Former Smoker    Quit date: 09/13/2007  . Smokeless tobacco: Never Used  . Alcohol Use: No       Review of Systems  Constitutional: Negative.   HENT: Negative.   Eyes: Negative.   Respiratory: Negative.   Cardiovascular: Negative.   Gastrointestinal: Positive for heartburn.  Genitourinary: Negative.   Musculoskeletal: Positive for back pain and joint pain.  Skin: Negative.   Neurological: Negative.   Endo/Heme/Allergies: Negative.   Psychiatric/Behavioral: The patient is nervous/anxious.      Objective:  Physical Exam  Constitutional: He is oriented to person, place, and time. He appears well-developed and well-nourished.  HENT:  Head: Normocephalic and atraumatic.  Mouth/Throat: Oropharynx is clear and moist.  Eyes: Pupils are equal, round, and reactive to light.  Neck:  Neck supple. No JVD present. No tracheal deviation present. No thyromegaly present.  Cardiovascular: Normal rate, regular rhythm and intact distal pulses.   Murmur heard. Respiratory: Effort normal and breath sounds normal. No stridor. No respiratory distress. He has no wheezes.  GI: Soft. There is no tenderness. There is no guarding.  Musculoskeletal:       Right knee: He exhibits decreased range of motion, laceration (previous incision healed) and bony tenderness. He exhibits no effusion, no ecchymosis, no deformity, no erythema and normal alignment. Tenderness found.       Right ankle: He exhibits laceration (across top of foot. From pressure washer.  Looks to be healing well and no signs of infection).  Lymphadenopathy:    He has no cervical adenopathy.  Neurological: He  is alert and oriented to person, place, and time.  Skin: Skin is warm and dry.  Psychiatric: He has a normal mood and affect.    Vital signs in last 24 hours: Temp:  [97.5 F (36.4 C)] 97.5 F (36.4 C) (09/09 1335) Pulse Rate:  [53] 53 (09/09 1335) Resp:  [16] 16 (09/09 1335) BP: (145)/(81) 145/81 mmHg (09/09 1335) SpO2:  [96 %] 96 % (09/09 1335) Weight:  [102.422 kg (225 lb 12.8 oz)] 102.422 kg (225 lb 12.8 oz) (09/09 1335)  Labs:  Estimated body mass index is 33.00 kg/(m^2) as calculated from the following:   Height as of 08/11/13: 5\' 10"  (1.778 m).   Weight as of 08/11/13: 104.327 kg (230 lb).  Imaging Review Plain radiographs demonstrate previous medial UKR of the right knee(s). The overall alignment is neutral.  The bone quality appears to be good for age and reported activity level.  Assessment/Plan:  Right knee with failed previous UKR.   The patient history, physical examination, clinical judgment of the provider and imaging studies are consistent with previous medial UKR of the right knee. Incisional debridement and possible poly exchange is deemed medically necessary. The treatment options including  medical management, injection therapy, arthroscopy and revision arthroplasty were discussed at length. The risks and benefits of revision total knee arthroplasty were presented and reviewed. The risks due to aseptic loosening, infection, stiffness, patella tracking problems, thromboembolic complications and other imponderables were discussed. The patient acknowledged the explanation, agreed to proceed with the plan and consent was signed. Patient is being admitted for outpatient / observation treatment for surgery, pain control, PT, OT, prophylactic antibiotics, VTE prophylaxis, progressive ambulation and ADL's and discharge planning.The patient is planning to be discharged home with home health services.    [place] Aaliyana Fredericks   PA-C  02/11/2014, 4:08 PM

## 2014-02-12 DIAGNOSIS — E785 Hyperlipidemia, unspecified: Secondary | ICD-10-CM | POA: Diagnosis not present

## 2014-02-12 DIAGNOSIS — K219 Gastro-esophageal reflux disease without esophagitis: Secondary | ICD-10-CM | POA: Diagnosis not present

## 2014-02-12 DIAGNOSIS — F411 Generalized anxiety disorder: Secondary | ICD-10-CM | POA: Diagnosis not present

## 2014-02-12 DIAGNOSIS — Z8719 Personal history of other diseases of the digestive system: Secondary | ICD-10-CM | POA: Diagnosis not present

## 2014-02-12 DIAGNOSIS — Z6832 Body mass index (BMI) 32.0-32.9, adult: Secondary | ICD-10-CM | POA: Diagnosis not present

## 2014-02-12 DIAGNOSIS — Y831 Surgical operation with implant of artificial internal device as the cause of abnormal reaction of the patient, or of later complication, without mention of misadventure at the time of the procedure: Secondary | ICD-10-CM | POA: Diagnosis not present

## 2014-02-12 DIAGNOSIS — Z79899 Other long term (current) drug therapy: Secondary | ICD-10-CM | POA: Diagnosis not present

## 2014-02-12 DIAGNOSIS — T84099A Other mechanical complication of unspecified internal joint prosthesis, initial encounter: Secondary | ICD-10-CM | POA: Diagnosis not present

## 2014-02-12 DIAGNOSIS — Z7982 Long term (current) use of aspirin: Secondary | ICD-10-CM | POA: Diagnosis not present

## 2014-02-12 DIAGNOSIS — G4733 Obstructive sleep apnea (adult) (pediatric): Secondary | ICD-10-CM | POA: Diagnosis not present

## 2014-02-12 DIAGNOSIS — E669 Obesity, unspecified: Secondary | ICD-10-CM | POA: Diagnosis not present

## 2014-02-12 DIAGNOSIS — H409 Unspecified glaucoma: Secondary | ICD-10-CM | POA: Diagnosis not present

## 2014-02-12 DIAGNOSIS — E119 Type 2 diabetes mellitus without complications: Secondary | ICD-10-CM | POA: Diagnosis not present

## 2014-02-12 DIAGNOSIS — Z96659 Presence of unspecified artificial knee joint: Secondary | ICD-10-CM | POA: Diagnosis not present

## 2014-02-12 DIAGNOSIS — I252 Old myocardial infarction: Secondary | ICD-10-CM | POA: Diagnosis not present

## 2014-02-12 DIAGNOSIS — L905 Scar conditions and fibrosis of skin: Secondary | ICD-10-CM | POA: Diagnosis not present

## 2014-02-12 DIAGNOSIS — N400 Enlarged prostate without lower urinary tract symptoms: Secondary | ICD-10-CM | POA: Diagnosis not present

## 2014-02-12 DIAGNOSIS — I10 Essential (primary) hypertension: Secondary | ICD-10-CM | POA: Diagnosis not present

## 2014-02-12 DIAGNOSIS — I2510 Atherosclerotic heart disease of native coronary artery without angina pectoris: Secondary | ICD-10-CM | POA: Diagnosis not present

## 2014-02-12 DIAGNOSIS — I359 Nonrheumatic aortic valve disorder, unspecified: Secondary | ICD-10-CM | POA: Diagnosis not present

## 2014-02-12 DIAGNOSIS — F4310 Post-traumatic stress disorder, unspecified: Secondary | ICD-10-CM | POA: Diagnosis not present

## 2014-02-13 LAB — ABO/RH: ABO/RH(D): AB NEG

## 2014-02-13 NOTE — Pre-Procedure Instructions (Signed)
PT'S PREOP LABS FAXED TO DR. Aurea Graff OFFICE WITH NOTE BLOOD GLUCOSE 274, URINE GLUCOSE > 1000, PCR STAPH AUREUS.

## 2014-02-15 NOTE — Anesthesia Preprocedure Evaluation (Addendum)
Anesthesia Evaluation  Patient identified by MRN, date of birth, ID band Patient awake    Reviewed: Allergy & Precautions, H&P , NPO status , Patient's Chart, lab work & pertinent test results, reviewed documented beta blocker date and time   Airway Mallampati: II TM Distance: >3 FB Neck ROM: Full    Dental  (+) Dental Advisory Given   Pulmonary sleep apnea , former smoker,  breath sounds clear to auscultation        Cardiovascular hypertension, Pt. on medications and Pt. on home beta blockers + CAD, + Past MI and + Cardiac Stents + Valvular Problems/Murmurs Rhythm:Regular Rate:Normal  Echo 07/2013 - Left ventricle: Average global strain is reduced at -9%   The cavity size was normal. Systolic function was mildly   to moderately reduced. The estimated ejection fraction was in the range of 40% to 45%. Wall motion was normal; there were no regional wall motion abnormalities. - Aortic valve: Moderate thickening and calcification,   consistent with sclerosis. - Left atrium: The atrium was moderately dilated.    Neuro/Psych PSYCHIATRIC DISORDERS Anxiety negative neurological ROS  negative psych ROS   GI/Hepatic Neg liver ROS, PUD, GERD-  Medicated,  Endo/Other  diabetes, Type 2, Oral Hypoglycemic Agents  Renal/GU negative Renal ROS     Musculoskeletal  (+) Arthritis -, Osteoarthritis,    Abdominal   Peds  Hematology negative hematology ROS (+)   Anesthesia Other Findings   Reproductive/Obstetrics                          Anesthesia Physical  Anesthesia Plan  ASA: III  Anesthesia Plan: General   Post-op Pain Management:    Induction: Intravenous  Airway Management Planned: LMA  Additional Equipment:   Intra-op Plan:   Post-operative Plan: Extubation in OR  Informed Consent: I have reviewed the patients History and Physical, chart, labs and discussed the procedure including the risks,  benefits and alternatives for the proposed anesthesia with the patient or authorized representative who has indicated his/her understanding and acceptance.   Dental advisory given  Plan Discussed with: CRNA  Anesthesia Plan Comments:        Anesthesia Quick Evaluation                                  Anesthesia Evaluation  Patient identified by MRN, date of birth, ID band Patient awake    Reviewed: Allergy & Precautions, H&P , NPO status , Patient's Chart, lab work & pertinent test results, reviewed documented beta blocker date and time   History of Anesthesia Complications Negative for: history of anesthetic complications  Airway Mallampati: III TM Distance: >3 FB Neck ROM: full    Dental No notable dental hx. (+) Teeth Intact, Dental Advisory Given and Caps   Pulmonary neg pulmonary ROS, sleep apnea and Continuous Positive Airway Pressure Ventilation ,  breath sounds clear to auscultation  Pulmonary exam normal       Cardiovascular Exercise Tolerance: Good hypertension, On Home Beta Blockers + CAD, + Past MI (MI in April 2011 following cervical disc surgery.) and + Cardiac Stents (Cardiac stents X 4; last stent 2009. Patient denies cardiac symptoms, cleared for surgical procedure  by cardiologist.) Rhythm:regular Rate:Normal  angioplasty   Neuro/Psych PSYCHIATRIC DISORDERS PTSDCervical diskectomy negative neurological ROS  negative psych ROS   GI/Hepatic negative GI ROS, Neg liver ROS, GERD-  Medicated and Controlled,  Endo/Other  negative endocrine ROSdiabetes, Type 2, Oral Hypoglycemic Agents  Renal/GU negative Renal ROS   BPH    Musculoskeletal   Abdominal (+) + obese,   Peds  Hematology negative hematology ROS (+)   Anesthesia Other Findings   Reproductive/Obstetrics negative OB ROS                           Anesthesia Physical  Anesthesia Plan  ASA: III  Anesthesia Plan: Spinal   Post-op Pain  Management:    Induction: Intravenous  Airway Management Planned: Simple Face Mask  Additional Equipment:   Intra-op Plan:   Post-operative Plan: Extubation in OR  Informed Consent: I have reviewed the patients History and Physical, chart, labs and discussed the procedure including the risks, benefits and alternatives for the proposed anesthesia with the patient or authorized representative who has indicated his/her understanding and acceptance.   Dental Advisory Given  Plan Discussed with: CRNA and Surgeon  Anesthesia Plan Comments:         Anesthesia Quick Evaluation                                   Anesthesia Evaluation  Patient identified by MRN, date of birth, ID band Patient awake    Reviewed: Allergy & Precautions, H&P , NPO status , Patient's Chart, lab work & pertinent test results, reviewed documented beta blocker date and time   History of Anesthesia Complications Negative for: history of anesthetic complications  Airway Mallampati: III TM Distance: >3 FB Neck ROM: full    Dental No notable dental hx. (+) Teeth Intact, Dental Advisory Given and Caps   Pulmonary neg pulmonary ROS, sleep apnea and Continuous Positive Airway Pressure Ventilation ,  clear to auscultation  Pulmonary exam normal       Cardiovascular Exercise Tolerance: Good hypertension, On Home Beta Blockers + CAD, + Past MI (MI in April 2011 following cervical disc surgery.) and + Cardiac Stents (Cardiac stents X 4; last stent 2009. Patient denies cardiac symptoms, cleared for surgical procedure  by cardiologist.) regular Normal angioplasty   Neuro/Psych PSYCHIATRIC DISORDERS PTSDCervical diskectomy Negative Neurological ROS  Negative Psych ROS   GI/Hepatic negative GI ROS, Neg liver ROS, GERD-  Medicated and Controlled,  Endo/Other  Negative Endocrine ROSDiabetes mellitus-, Well Controlled, Type 2, Oral Hypoglycemic AgentsPatient states he took oral diabetic agent this  AM  Renal/GU negative Renal ROS   BPH    Musculoskeletal   Abdominal (+) obese,   Peds  Hematology negative hematology ROS (+)   Anesthesia Other Findings   Reproductive/Obstetrics negative OB ROS                        Anesthesia Physical Anesthesia Plan  ASA: III  Anesthesia Plan: General   Post-op Pain Management:    Induction: Intravenous  Airway Management Planned: Oral ETT  Additional Equipment:   Intra-op Plan:   Post-operative Plan: Extubation in OR  Informed Consent: I have reviewed the patients History and Physical, chart, labs and discussed the procedure including the risks, benefits and alternatives for the proposed anesthesia with the patient or authorized representative who has indicated his/her understanding and acceptance.   Dental Advisory Given  Plan Discussed with: CRNA and Surgeon  Anesthesia Plan Comments:        Anesthesia Quick Evaluation  Anesthesia Evaluation  Patient identified by MRN, date of birth, ID band Patient awake    Reviewed: Allergy & Precautions, H&P , NPO status , Patient's Chart, lab work & pertinent test results, reviewed documented beta blocker date and time   History of Anesthesia Complications Negative for: history of anesthetic complications  Airway Mallampati: III TM Distance: >3 FB Neck ROM: full    Dental No notable dental hx. (+) Teeth Intact, Dental Advisory Given and Caps   Pulmonary neg pulmonary ROS, sleep apnea and Continuous Positive Airway Pressure Ventilation ,  clear to auscultation  Pulmonary exam normal       Cardiovascular Exercise Tolerance: Good hypertension, On Home Beta Blockers + CAD, + Past MI (MI in April 2011 following cervical disc surgery.) and + Cardiac Stents (Cardiac stents X 4; last stent 2009. Patient denies cardiac symptoms, cleared for surgical procedure  by cardiologist.) regular Normal angioplasty    Neuro/Psych PSYCHIATRIC DISORDERS PTSDCervical diskectomy Negative Neurological ROS  Negative Psych ROS   GI/Hepatic negative GI ROS, Neg liver ROS, GERD-  Medicated and Controlled,  Endo/Other  Negative Endocrine ROSDiabetes mellitus-, Well Controlled, Type 2, Oral Hypoglycemic AgentsPatient states he took oral diabetic agent this AM  Renal/GU negative Renal ROS   BPH    Musculoskeletal   Abdominal (+) obese,   Peds  Hematology negative hematology ROS (+)   Anesthesia Other Findings   Reproductive/Obstetrics negative OB ROS                        Anesthesia Physical Anesthesia Plan  ASA: III  Anesthesia Plan: General   Post-op Pain Management:    Induction: Intravenous  Airway Management Planned: Oral ETT  Additional Equipment:   Intra-op Plan:   Post-operative Plan: Extubation in OR  Informed Consent: I have reviewed the patients History and Physical, chart, labs and discussed the procedure including the risks, benefits and alternatives for the proposed anesthesia with the patient or authorized representative who has indicated his/her understanding and acceptance.   Dental Advisory Given  Plan Discussed with: CRNA and Surgeon  Anesthesia Plan Comments:        Anesthesia Quick Evaluation

## 2014-02-16 ENCOUNTER — Other Ambulatory Visit: Payer: Self-pay | Admitting: General Surgery

## 2014-02-16 ENCOUNTER — Encounter (HOSPITAL_COMMUNITY): Payer: Self-pay | Admitting: *Deleted

## 2014-02-16 ENCOUNTER — Encounter (HOSPITAL_COMMUNITY): Payer: Medicare Other | Admitting: Anesthesiology

## 2014-02-16 ENCOUNTER — Observation Stay (HOSPITAL_COMMUNITY)
Admission: RE | Admit: 2014-02-16 | Discharge: 2014-02-17 | Disposition: A | Discharge status: Home / Self Care | Payer: Medicare Other | Source: Ambulatory Visit | Attending: Orthopedic Surgery | Admitting: Orthopedic Surgery

## 2014-02-16 ENCOUNTER — Inpatient Hospital Stay (HOSPITAL_COMMUNITY): Payer: Medicare Other | Admitting: Anesthesiology

## 2014-02-16 ENCOUNTER — Encounter: Payer: Self-pay | Admitting: General Surgery

## 2014-02-16 ENCOUNTER — Encounter (HOSPITAL_COMMUNITY)
Admission: RE | Disposition: A | Discharge status: Home / Self Care | Payer: Self-pay | Source: Ambulatory Visit | Attending: Orthopedic Surgery

## 2014-02-16 DIAGNOSIS — M1990 Unspecified osteoarthritis, unspecified site: Secondary | ICD-10-CM | POA: Diagnosis not present

## 2014-02-16 DIAGNOSIS — Z6832 Body mass index (BMI) 32.0-32.9, adult: Secondary | ICD-10-CM | POA: Insufficient documentation

## 2014-02-16 DIAGNOSIS — F4310 Post-traumatic stress disorder, unspecified: Secondary | ICD-10-CM | POA: Insufficient documentation

## 2014-02-16 DIAGNOSIS — Z79899 Other long term (current) drug therapy: Secondary | ICD-10-CM | POA: Insufficient documentation

## 2014-02-16 DIAGNOSIS — K219 Gastro-esophageal reflux disease without esophagitis: Secondary | ICD-10-CM | POA: Diagnosis not present

## 2014-02-16 DIAGNOSIS — Z96659 Presence of unspecified artificial knee joint: Secondary | ICD-10-CM | POA: Diagnosis not present

## 2014-02-16 DIAGNOSIS — G4733 Obstructive sleep apnea (adult) (pediatric): Secondary | ICD-10-CM | POA: Insufficient documentation

## 2014-02-16 DIAGNOSIS — I2510 Atherosclerotic heart disease of native coronary artery without angina pectoris: Secondary | ICD-10-CM | POA: Insufficient documentation

## 2014-02-16 DIAGNOSIS — I359 Nonrheumatic aortic valve disorder, unspecified: Secondary | ICD-10-CM | POA: Insufficient documentation

## 2014-02-16 DIAGNOSIS — T84099A Other mechanical complication of unspecified internal joint prosthesis, initial encounter: Secondary | ICD-10-CM | POA: Diagnosis not present

## 2014-02-16 DIAGNOSIS — I252 Old myocardial infarction: Secondary | ICD-10-CM | POA: Insufficient documentation

## 2014-02-16 DIAGNOSIS — T84039A Mechanical loosening of unspecified internal prosthetic joint, initial encounter: Secondary | ICD-10-CM | POA: Diagnosis not present

## 2014-02-16 DIAGNOSIS — Z96651 Presence of right artificial knee joint: Secondary | ICD-10-CM

## 2014-02-16 DIAGNOSIS — E119 Type 2 diabetes mellitus without complications: Secondary | ICD-10-CM | POA: Insufficient documentation

## 2014-02-16 DIAGNOSIS — E669 Obesity, unspecified: Secondary | ICD-10-CM | POA: Insufficient documentation

## 2014-02-16 DIAGNOSIS — Y831 Surgical operation with implant of artificial internal device as the cause of abnormal reaction of the patient, or of later complication, without mention of misadventure at the time of the procedure: Secondary | ICD-10-CM | POA: Insufficient documentation

## 2014-02-16 DIAGNOSIS — T84498A Other mechanical complication of other internal orthopedic devices, implants and grafts, initial encounter: Secondary | ICD-10-CM | POA: Diagnosis not present

## 2014-02-16 DIAGNOSIS — E7800 Pure hypercholesterolemia, unspecified: Secondary | ICD-10-CM

## 2014-02-16 DIAGNOSIS — F411 Generalized anxiety disorder: Secondary | ICD-10-CM | POA: Insufficient documentation

## 2014-02-16 DIAGNOSIS — Z8719 Personal history of other diseases of the digestive system: Secondary | ICD-10-CM | POA: Insufficient documentation

## 2014-02-16 DIAGNOSIS — H409 Unspecified glaucoma: Secondary | ICD-10-CM | POA: Insufficient documentation

## 2014-02-16 DIAGNOSIS — L905 Scar conditions and fibrosis of skin: Secondary | ICD-10-CM | POA: Insufficient documentation

## 2014-02-16 DIAGNOSIS — E785 Hyperlipidemia, unspecified: Secondary | ICD-10-CM | POA: Insufficient documentation

## 2014-02-16 DIAGNOSIS — I10 Essential (primary) hypertension: Secondary | ICD-10-CM | POA: Diagnosis not present

## 2014-02-16 DIAGNOSIS — Z7982 Long term (current) use of aspirin: Secondary | ICD-10-CM | POA: Insufficient documentation

## 2014-02-16 DIAGNOSIS — N400 Enlarged prostate without lower urinary tract symptoms: Secondary | ICD-10-CM | POA: Insufficient documentation

## 2014-02-16 HISTORY — PX: TOTAL KNEE REVISION WITH SCAR DEBRIDEMENT/PATELLA REVISION WITH POLY EXCHANGE: SHX6128

## 2014-02-16 LAB — GLUCOSE, CAPILLARY
GLUCOSE-CAPILLARY: 299 mg/dL — AB (ref 70–99)
GLUCOSE-CAPILLARY: 262 mg/dL — AB (ref 70–99)
Glucose-Capillary: 429 mg/dL — ABNORMAL HIGH (ref 70–99)
Glucose-Capillary: 355 mg/dL — ABNORMAL HIGH (ref 70–99)

## 2014-02-16 LAB — TYPE AND SCREEN
ABO/RH(D): AB NEG
Antibody Screen: NEGATIVE

## 2014-02-16 SURGERY — TOTAL KNEE REVISION WITH SCAR DEBRIDEMENT/PATELLA REVISION WITH POLY EXCHANGE
Anesthesia: General | Site: Knee | Laterality: Right

## 2014-02-16 MED ORDER — CLOPIDOGREL BISULFATE 75 MG PO TABS
75.0000 mg | ORAL_TABLET | Freq: Every day | ORAL | Status: DC
  Administered 2014-02-17: 75 mg via ORAL
  Filled 2014-02-16 (×2): qty 1

## 2014-02-16 MED ORDER — DEXAMETHASONE SODIUM PHOSPHATE 10 MG/ML IJ SOLN: 10.0000 mg | Freq: Once | INTRAMUSCULAR | Status: DC

## 2014-02-16 MED ORDER — PHENYLEPHRINE HCL 10 MG/ML IJ SOLN
INTRAMUSCULAR | Status: DC | PRN
  Administered 2014-02-16: 120 ug via INTRAVENOUS

## 2014-02-16 MED ORDER — EPHEDRINE SULFATE 50 MG/ML IJ SOLN
INTRAMUSCULAR | Status: AC
  Filled 2014-02-16: qty 1

## 2014-02-16 MED ORDER — DEXAMETHASONE SODIUM PHOSPHATE 10 MG/ML IJ SOLN
INTRAMUSCULAR | Status: DC | PRN
  Administered 2014-02-16: 10 mg via INTRAVENOUS

## 2014-02-16 MED ORDER — ONDANSETRON HCL 4 MG/2ML IJ SOLN: 4.0000 mg | Freq: Four times a day (QID) | INTRAMUSCULAR | Status: DC | PRN

## 2014-02-16 MED ORDER — DEXAMETHASONE SODIUM PHOSPHATE 10 MG/ML IJ SOLN
10.0000 mg | Freq: Once | INTRAMUSCULAR | Status: AC
  Administered 2014-02-17: 10 mg via INTRAVENOUS
  Filled 2014-02-16: qty 1

## 2014-02-16 MED ORDER — PROPOFOL 10 MG/ML IV BOLUS
INTRAVENOUS | Status: AC
  Filled 2014-02-16: qty 20

## 2014-02-16 MED ORDER — HYDROMORPHONE HCL PF 1 MG/ML IJ SOLN
INTRAMUSCULAR | Status: AC
  Filled 2014-02-16: qty 1

## 2014-02-16 MED ORDER — METHOCARBAMOL 1000 MG/10ML IJ SOLN
500.0000 mg | Freq: Four times a day (QID) | INTRAVENOUS | Status: DC | PRN
  Administered 2014-02-16: 500 mg via INTRAVENOUS
  Filled 2014-02-16: qty 5

## 2014-02-16 MED ORDER — OXYCODONE HCL 5 MG PO TABS: 5.0000 mg | ORAL_TABLET | Freq: Once | ORAL | Status: DC | PRN

## 2014-02-16 MED ORDER — BUPIVACAINE LIPOSOME 1.3 % IJ SUSP
20.0000 mL | Freq: Once | INTRAMUSCULAR | Status: AC
  Administered 2014-02-16: 20 mL
  Filled 2014-02-16: qty 20

## 2014-02-16 MED ORDER — INSULIN ASPART 100 UNIT/ML ~~LOC~~ SOLN
5.0000 [IU] | Freq: Once | SUBCUTANEOUS | Status: AC
  Administered 2014-02-16: 5 [IU] via SUBCUTANEOUS

## 2014-02-16 MED ORDER — FENTANYL CITRATE 0.05 MG/ML IJ SOLN
INTRAMUSCULAR | Status: AC
  Filled 2014-02-16: qty 5

## 2014-02-16 MED ORDER — HYOSCYAMINE SULFATE 0.125 MG PO TABS
0.1250 mg | ORAL_TABLET | ORAL | Status: DC | PRN
  Filled 2014-02-16: qty 1

## 2014-02-16 MED ORDER — INSULIN ASPART 100 UNIT/ML ~~LOC~~ SOLN
0.0000 [IU] | Freq: Every day | SUBCUTANEOUS | Status: DC
  Administered 2014-02-16: 5 [IU] via SUBCUTANEOUS

## 2014-02-16 MED ORDER — METOCLOPRAMIDE HCL 5 MG/ML IJ SOLN: 5.0000 mg | Freq: Three times a day (TID) | INTRAMUSCULAR | Status: DC | PRN

## 2014-02-16 MED ORDER — ONDANSETRON HCL 4 MG PO TABS: 4.0000 mg | ORAL_TABLET | Freq: Four times a day (QID) | ORAL | Status: DC | PRN

## 2014-02-16 MED ORDER — FERROUS SULFATE 325 (65 FE) MG PO TABS
325.0000 mg | ORAL_TABLET | Freq: Three times a day (TID) | ORAL | Status: DC
  Administered 2014-02-16 – 2014-02-17 (×3): 325 mg via ORAL
  Filled 2014-02-16 (×6): qty 1

## 2014-02-16 MED ORDER — BISACODYL 10 MG RE SUPP: 10.0000 mg | Freq: Every day | RECTAL | Status: DC | PRN

## 2014-02-16 MED ORDER — ATORVASTATIN CALCIUM 40 MG PO TABS
40.0000 mg | ORAL_TABLET | Freq: Every day | ORAL | Status: DC
  Administered 2014-02-16: 40 mg via ORAL
  Filled 2014-02-16 (×2): qty 1

## 2014-02-16 MED ORDER — KETOROLAC TROMETHAMINE 30 MG/ML IJ SOLN
INTRAMUSCULAR | Status: DC | PRN
  Administered 2014-02-16: 30 mg via INTRAVENOUS

## 2014-02-16 MED ORDER — SERTRALINE HCL 100 MG PO TABS
100.0000 mg | ORAL_TABLET | Freq: Every day | ORAL | Status: DC
  Administered 2014-02-16: 100 mg via ORAL
  Filled 2014-02-16 (×2): qty 1

## 2014-02-16 MED ORDER — TRAZODONE HCL 150 MG PO TABS
150.0000 mg | ORAL_TABLET | Freq: Every day | ORAL | Status: DC
  Administered 2014-02-16: 150 mg via ORAL
  Filled 2014-02-16 (×2): qty 1

## 2014-02-16 MED ORDER — LACTATED RINGERS IV SOLN: INTRAVENOUS | Status: DC

## 2014-02-16 MED ORDER — FLUTICASONE PROPIONATE 50 MCG/ACT NA SUSP
1.0000 | Freq: Every day | NASAL | Status: DC | PRN
  Filled 2014-02-16: qty 16

## 2014-02-16 MED ORDER — METOCLOPRAMIDE HCL 10 MG PO TABS: 5.0000 mg | ORAL_TABLET | Freq: Three times a day (TID) | ORAL | Status: DC | PRN

## 2014-02-16 MED ORDER — MENTHOL 3 MG MT LOZG: 1.0000 | LOZENGE | OROMUCOSAL | Status: DC | PRN

## 2014-02-16 MED ORDER — SODIUM CHLORIDE 0.9 % IJ SOLN
INTRAMUSCULAR | Status: AC
  Filled 2014-02-16: qty 10

## 2014-02-16 MED ORDER — DIPHENHYDRAMINE HCL 25 MG PO CAPS: 25.0000 mg | ORAL_CAPSULE | Freq: Four times a day (QID) | ORAL | Status: DC | PRN

## 2014-02-16 MED ORDER — CEFAZOLIN SODIUM-DEXTROSE 2-3 GM-% IV SOLR
2.0000 g | Freq: Four times a day (QID) | INTRAVENOUS | Status: AC
  Administered 2014-02-16 (×2): 2 g via INTRAVENOUS
  Filled 2014-02-16 (×2): qty 50

## 2014-02-16 MED ORDER — PHENYLEPHRINE 40 MCG/ML (10ML) SYRINGE FOR IV PUSH (FOR BLOOD PRESSURE SUPPORT)
PREFILLED_SYRINGE | INTRAVENOUS | Status: AC
  Filled 2014-02-16: qty 10

## 2014-02-16 MED ORDER — INSULIN ASPART 100 UNIT/ML ~~LOC~~ SOLN
SUBCUTANEOUS | Status: AC
  Filled 2014-02-16: qty 1

## 2014-02-16 MED ORDER — MIDAZOLAM HCL 5 MG/5ML IJ SOLN
INTRAMUSCULAR | Status: DC | PRN
  Administered 2014-02-16: 2 mg via INTRAVENOUS

## 2014-02-16 MED ORDER — KETOROLAC TROMETHAMINE 30 MG/ML IJ SOLN
INTRAMUSCULAR | Status: AC
  Filled 2014-02-16: qty 1

## 2014-02-16 MED ORDER — ASPIRIN 81 MG PO CHEW
81.0000 mg | CHEWABLE_TABLET | Freq: Every day | ORAL | Status: DC
  Administered 2014-02-17: 81 mg via ORAL
  Filled 2014-02-16 (×2): qty 1

## 2014-02-16 MED ORDER — METHOCARBAMOL 500 MG PO TABS
500.0000 mg | ORAL_TABLET | Freq: Four times a day (QID) | ORAL | Status: DC | PRN
  Administered 2014-02-16: 500 mg via ORAL
  Filled 2014-02-16 (×2): qty 1

## 2014-02-16 MED ORDER — ONDANSETRON HCL 4 MG/2ML IJ SOLN
INTRAMUSCULAR | Status: DC | PRN
  Administered 2014-02-16: 4 mg via INTRAVENOUS

## 2014-02-16 MED ORDER — HYDROMORPHONE HCL PF 1 MG/ML IJ SOLN: 0.5000 mg | INTRAMUSCULAR | Status: DC | PRN

## 2014-02-16 MED ORDER — BUPIVACAINE-EPINEPHRINE (PF) 0.25% -1:200000 IJ SOLN
INTRAMUSCULAR | Status: AC
  Filled 2014-02-16: qty 30

## 2014-02-16 MED ORDER — LACTATED RINGERS IV SOLN
INTRAVENOUS | Status: DC | PRN
  Administered 2014-02-16 (×2): via INTRAVENOUS

## 2014-02-16 MED ORDER — POLYETHYLENE GLYCOL 3350 17 G PO PACK
17.0000 g | PACK | Freq: Two times a day (BID) | ORAL | Status: DC
  Administered 2014-02-16 – 2014-02-17 (×2): 17 g via ORAL

## 2014-02-16 MED ORDER — NITROGLYCERIN 0.4 MG SL SUBL: 0.4000 mg | SUBLINGUAL_TABLET | SUBLINGUAL | Status: DC | PRN

## 2014-02-16 MED ORDER — BUPIVACAINE-EPINEPHRINE (PF) 0.25% -1:200000 IJ SOLN
INTRAMUSCULAR | Status: DC | PRN
  Administered 2014-02-16: 30 mL

## 2014-02-16 MED ORDER — PROMETHAZINE HCL 25 MG/ML IJ SOLN: 6.2500 mg | INTRAMUSCULAR | Status: DC | PRN

## 2014-02-16 MED ORDER — METOCLOPRAMIDE HCL 5 MG/ML IJ SOLN
INTRAMUSCULAR | Status: DC | PRN
  Administered 2014-02-16: 10 mg via INTRAVENOUS

## 2014-02-16 MED ORDER — TRANEXAMIC ACID 100 MG/ML IV SOLN
2000.0000 mg | INTRAVENOUS | Status: DC | PRN
  Administered 2014-02-16: 2000 mg via TOPICAL

## 2014-02-16 MED ORDER — METOPROLOL TARTRATE 50 MG PO TABS
50.0000 mg | ORAL_TABLET | Freq: Every day | ORAL | Status: DC
  Administered 2014-02-16: 50 mg via ORAL
  Filled 2014-02-16 (×2): qty 1

## 2014-02-16 MED ORDER — HYDROMORPHONE HCL PF 1 MG/ML IJ SOLN
0.2500 mg | INTRAMUSCULAR | Status: DC | PRN
  Administered 2014-02-16 (×2): 0.5 mg via INTRAVENOUS

## 2014-02-16 MED ORDER — MEPERIDINE HCL 50 MG/ML IJ SOLN: 6.2500 mg | INTRAMUSCULAR | Status: DC | PRN

## 2014-02-16 MED ORDER — INSULIN ASPART 100 UNIT/ML ~~LOC~~ SOLN
0.0000 [IU] | Freq: Three times a day (TID) | SUBCUTANEOUS | Status: DC
  Administered 2014-02-16: 15 [IU] via SUBCUTANEOUS
  Administered 2014-02-17: 8 [IU] via SUBCUTANEOUS

## 2014-02-16 MED ORDER — CHLORHEXIDINE GLUCONATE 4 % EX LIQD: 60.0000 mL | Freq: Once | CUTANEOUS | Status: DC

## 2014-02-16 MED ORDER — CEFAZOLIN SODIUM-DEXTROSE 2-3 GM-% IV SOLR
2.0000 g | INTRAVENOUS | Status: AC
  Administered 2014-02-16: 2 g via INTRAVENOUS

## 2014-02-16 MED ORDER — SODIUM CHLORIDE 0.9 % IV SOLN
INTRAVENOUS | Status: DC
  Administered 2014-02-16 – 2014-02-17 (×2): via INTRAVENOUS
  Filled 2014-02-16 (×5): qty 1000

## 2014-02-16 MED ORDER — ALUM & MAG HYDROXIDE-SIMETH 200-200-20 MG/5ML PO SUSP: 30.0000 mL | ORAL | Status: DC | PRN

## 2014-02-16 MED ORDER — MIDAZOLAM HCL 2 MG/2ML IJ SOLN
INTRAMUSCULAR | Status: AC
  Filled 2014-02-16: qty 2

## 2014-02-16 MED ORDER — PHENOL 1.4 % MT LIQD: 1.0000 | OROMUCOSAL | Status: DC | PRN

## 2014-02-16 MED ORDER — LOSARTAN POTASSIUM 25 MG PO TABS
12.5000 mg | ORAL_TABLET | Freq: Every day | ORAL | Status: DC
  Administered 2014-02-16: 12.5 mg via ORAL
  Filled 2014-02-16 (×2): qty 0.5

## 2014-02-16 MED ORDER — FENTANYL CITRATE 0.05 MG/ML IJ SOLN
INTRAMUSCULAR | Status: DC | PRN
  Administered 2014-02-16: 100 ug via INTRAVENOUS

## 2014-02-16 MED ORDER — TRANEXAMIC ACID 100 MG/ML IV SOLN
2000.0000 mg | Freq: Once | INTRAVENOUS | Status: DC
  Filled 2014-02-16: qty 20

## 2014-02-16 MED ORDER — LINAGLIPTIN 5 MG PO TABS
5.0000 mg | ORAL_TABLET | Freq: Every day | ORAL | Status: DC
  Administered 2014-02-16: 5 mg via ORAL
  Filled 2014-02-16 (×2): qty 1

## 2014-02-16 MED ORDER — EZETIMIBE 10 MG PO TABS
10.0000 mg | ORAL_TABLET | Freq: Every day | ORAL | Status: DC
  Administered 2014-02-16: 10 mg via ORAL
  Filled 2014-02-16 (×2): qty 1

## 2014-02-16 MED ORDER — EPHEDRINE SULFATE 50 MG/ML IJ SOLN
INTRAMUSCULAR | Status: DC | PRN
  Administered 2014-02-16: 5 mg via INTRAVENOUS
  Administered 2014-02-16 (×2): 10 mg via INTRAVENOUS

## 2014-02-16 MED ORDER — DOCUSATE SODIUM 100 MG PO CAPS
100.0000 mg | ORAL_CAPSULE | Freq: Two times a day (BID) | ORAL | Status: DC
  Administered 2014-02-16 – 2014-02-17 (×2): 100 mg via ORAL

## 2014-02-16 MED ORDER — PROPOFOL 10 MG/ML IV BOLUS
INTRAVENOUS | Status: DC | PRN
  Administered 2014-02-16: 150 mg via INTRAVENOUS

## 2014-02-16 MED ORDER — 0.9 % SODIUM CHLORIDE (POUR BTL) OPTIME
TOPICAL | Status: DC | PRN
  Administered 2014-02-16: 1000 mL

## 2014-02-16 MED ORDER — CELECOXIB 200 MG PO CAPS
200.0000 mg | ORAL_CAPSULE | Freq: Two times a day (BID) | ORAL | Status: DC
  Administered 2014-02-16 – 2014-02-17 (×2): 200 mg via ORAL
  Filled 2014-02-16 (×3): qty 1

## 2014-02-16 MED ORDER — MAGNESIUM CITRATE PO SOLN: 1.0000 | Freq: Once | ORAL | Status: AC | PRN

## 2014-02-16 MED ORDER — OXYCODONE HCL 5 MG/5ML PO SOLN
5.0000 mg | Freq: Once | ORAL | Status: DC | PRN
  Filled 2014-02-16: qty 5

## 2014-02-16 MED ORDER — CEFAZOLIN SODIUM-DEXTROSE 2-3 GM-% IV SOLR
INTRAVENOUS | Status: AC
  Filled 2014-02-16: qty 50

## 2014-02-16 MED ORDER — PANTOPRAZOLE SODIUM 40 MG PO TBEC
40.0000 mg | DELAYED_RELEASE_TABLET | Freq: Every day | ORAL | Status: DC
  Administered 2014-02-16: 40 mg via ORAL
  Filled 2014-02-16 (×2): qty 1

## 2014-02-16 MED ORDER — OXYCODONE HCL 5 MG PO TABS
5.0000 mg | ORAL_TABLET | ORAL | Status: DC
  Administered 2014-02-16 (×2): 5 mg via ORAL
  Administered 2014-02-16: 10 mg via ORAL
  Administered 2014-02-17: 5 mg via ORAL
  Filled 2014-02-16: qty 1
  Filled 2014-02-16 (×2): qty 2
  Filled 2014-02-16: qty 1
  Filled 2014-02-16: qty 2
  Filled 2014-02-16: qty 1

## 2014-02-16 MED ORDER — QUETIAPINE FUMARATE 25 MG PO TABS
25.0000 mg | ORAL_TABLET | Freq: Every day | ORAL | Status: DC
  Administered 2014-02-16: 25 mg via ORAL
  Filled 2014-02-16 (×2): qty 1

## 2014-02-16 SURGICAL SUPPLY — 61 items
ADH SKN CLS APL DERMABOND .7 (GAUZE/BANDAGES/DRESSINGS) ×1
BAG SPEC THK2 15X12 ZIP CLS (MISCELLANEOUS) ×1
BAG ZIPLOCK 12X15 (MISCELLANEOUS) ×3 IMPLANT
BANDAGE ELASTIC 6 VELCRO ST LF (GAUZE/BANDAGES/DRESSINGS) ×3 IMPLANT
BANDAGE ESMARK 6X9 LF (GAUZE/BANDAGES/DRESSINGS) ×1 IMPLANT
BEARING ANATOMIC 5 (Orthopedic Implant) ×1 IMPLANT
BEARING ANATOMIC 5MM (Orthopedic Implant) ×1 IMPLANT
BLADE SAW SGTL 13.0X1.19X90.0M (BLADE) ×3 IMPLANT
BLADE SAW SGTL 81X20 HD (BLADE) ×3 IMPLANT
BNDG CMPR 9X6 STRL LF SNTH (GAUZE/BANDAGES/DRESSINGS) ×1
BNDG ESMARK 6X9 LF (GAUZE/BANDAGES/DRESSINGS) ×3
BOWL SMART MIX CTS (DISPOSABLE) IMPLANT
BRNG TIB MED 5 PHS 3 RT MEN (Orthopedic Implant) ×1 IMPLANT
CUFF TOURN SGL QUICK 34 (TOURNIQUET CUFF) ×3
CUFF TRNQT CYL 34X4X40X1 (TOURNIQUET CUFF) ×1 IMPLANT
DERMABOND ADVANCED (GAUZE/BANDAGES/DRESSINGS) ×2
DERMABOND ADVANCED .7 DNX12 (GAUZE/BANDAGES/DRESSINGS) IMPLANT
DRAPE EXTREMITY T 121X128X90 (DRAPE) ×3 IMPLANT
DRAPE POUCH INSTRU U-SHP 10X18 (DRAPES) ×3 IMPLANT
DRAPE U-SHAPE 47X51 STRL (DRAPES) ×3 IMPLANT
DRSG ADAPTIC 3X8 NADH LF (GAUZE/BANDAGES/DRESSINGS) ×3 IMPLANT
DRSG PAD ABDOMINAL 8X10 ST (GAUZE/BANDAGES/DRESSINGS) ×3 IMPLANT
DURAPREP 26ML APPLICATOR (WOUND CARE) ×3 IMPLANT
ELECT REM PT RETURN 9FT ADLT (ELECTROSURGICAL) ×3
ELECTRODE REM PT RTRN 9FT ADLT (ELECTROSURGICAL) ×1 IMPLANT
FACESHIELD WRAPAROUND (MASK) ×15 IMPLANT
FACESHIELD WRAPAROUND OR TEAM (MASK) ×5 IMPLANT
GAUZE SPONGE 4X4 12PLY STRL (GAUZE/BANDAGES/DRESSINGS) ×6 IMPLANT
GLOVE BIOGEL PI IND STRL 7.5 (GLOVE) ×1 IMPLANT
GLOVE BIOGEL PI IND STRL 8.5 (GLOVE) ×2 IMPLANT
GLOVE BIOGEL PI INDICATOR 7.5 (GLOVE) ×2
GLOVE BIOGEL PI INDICATOR 8.5 (GLOVE) ×4
GLOVE ORTHO TXT STRL SZ7.5 (GLOVE) ×6 IMPLANT
GLOVE SURG ORTHO 8.0 STRL STRW (GLOVE) ×3 IMPLANT
GOWN SPEC L3 XXLG W/TWL (GOWN DISPOSABLE) ×6 IMPLANT
GOWN STRL REUS W/TWL LRG LVL3 (GOWN DISPOSABLE) ×3 IMPLANT
HANDPIECE INTERPULSE COAX TIP (DISPOSABLE)
IMMOBILIZER KNEE 20 (SOFTGOODS) ×2 IMPLANT
IMMOBILIZER KNEE 20 THIGH 36 (SOFTGOODS) IMPLANT
KIT BASIN OR (CUSTOM PROCEDURE TRAY) ×3 IMPLANT
MANIFOLD NEPTUNE II (INSTRUMENTS) ×3 IMPLANT
NDL SAFETY ECLIPSE 18X1.5 (NEEDLE) ×1 IMPLANT
NEEDLE HYPO 18GX1.5 SHARP (NEEDLE) ×3
NS IRRIG 1000ML POUR BTL (IV SOLUTION) ×3 IMPLANT
PACK TOTAL JOINT (CUSTOM PROCEDURE TRAY) ×3 IMPLANT
PADDING CAST COTTON 6X4 STRL (CAST SUPPLIES) ×6 IMPLANT
POSITIONER SURGICAL ARM (MISCELLANEOUS) ×1 IMPLANT
SET HNDPC FAN SPRY TIP SCT (DISPOSABLE) ×1 IMPLANT
SET PAD KNEE POSITIONER (MISCELLANEOUS) ×3 IMPLANT
SPONGE LAP 18X18 X RAY DECT (DISPOSABLE) ×3 IMPLANT
STAPLER VISISTAT 35W (STAPLE) IMPLANT
SUCTION FRAZIER 12FR DISP (SUCTIONS) ×3 IMPLANT
SUT VIC AB 1 CT1 36 (SUTURE) ×9 IMPLANT
SUT VIC AB 2-0 CT1 27 (SUTURE) ×9
SUT VIC AB 2-0 CT1 TAPERPNT 27 (SUTURE) ×3 IMPLANT
SYR 50ML LL SCALE MARK (SYRINGE) ×3 IMPLANT
TOWEL OR 17X26 10 PK STRL BLUE (TOWEL DISPOSABLE) ×6 IMPLANT
TOWER CARTRIDGE SMART MIX (DISPOSABLE) ×1 IMPLANT
TRAY FOLEY CATH 14FRSI W/METER (CATHETERS) ×1 IMPLANT
WATER STERILE IRR 1500ML POUR (IV SOLUTION) ×1 IMPLANT
WRAP KNEE MAXI GEL POST OP (GAUZE/BANDAGES/DRESSINGS) ×3 IMPLANT

## 2014-02-16 NOTE — Brief Op Note (Signed)
02/16/2014  8:52 AM  PATIENT:  Seth Washington  63 y.o. male  PRE-OPERATIVE DIAGNOSIS:  status post right uni knee replacement with excessive scar  POST-OPERATIVE DIAGNOSIS:  status post right uni knee replacement with excessive scar  PROCEDURE:  Procedure(s): RIGHT KNEE INCISIONAL DEBRIDEMENT(SCAR) AND POLY REVISION (Right)  SURGEON:  Surgeon(s) and Role:    * [name], MD - Primary  PHYSICIAN ASSISTANT: Danae Orleans, PA-C  ANESTHESIA:   general, LMA  EBL:  Total I/O In: 1000 [I.V.:1000] Out: -   BLOOD ADMINISTERED:none  DRAINS: none   LOCAL MEDICATIONS USED:  Exparel, marcaine combination  SPECIMEN:  No Specimen  DISPOSITION OF SPECIMEN:  N/A  COUNTS:  YES  TOURNIQUET:   Total Tourniquet Time Documented: Thigh (Right) - 31 minutes Total: Thigh (Right) - 31 minutes   DICTATION: .Other Dictation: Dictation Number [id number]  PLAN OF CARE: Admit for overnight observation  PATIENT DISPOSITION:  PACU - hemodynamically stable.   Delay start of Pharmacological VTE agent (>24hrs) due to surgical blood loss or risk of bleeding: no

## 2014-02-16 NOTE — Progress Notes (Signed)
Machine was setup and left in the room.  Pt stated that he would put it on and initiate therapy when he went to bed.  Pt had the same type of machine up until 2 weeks ago and thorough knowledge on how to turn the machine on and left with instructions to call RT should he need help.  Humidification turned on and filled to line with sterile water.

## 2014-02-16 NOTE — Transfer of Care (Signed)
Immediate Anesthesia Transfer of Care Note  Patient: Seth Washington  Procedure(s) Performed: Procedure(s): RIGHT KNEE INCISIONAL DEBRIDEMENT(SCAR) AND POLY REVISION (Right)  Patient Location: PACU  Anesthesia Type:General  Level of Consciousness: awake, sedated and patient cooperative  Airway & Oxygen Therapy: Patient Spontanous Breathing and Patient connected to face mask oxygen  Post-op Assessment: Report given to PACU RN and Post -op Vital signs reviewed and stable  Post vital signs: Reviewed and stable  Complications: No apparent anesthesia complications

## 2014-02-16 NOTE — Evaluation (Signed)
Physical Therapy Evaluation Patient Details Name: Seth [facility] MRN: [id number] DOB: [birth year] Today's Date: 02/16/2014   History of Present Illness  Pt is a 63 year old male s/p R knee incisional debridement and poly revision.  Clinical Impression  Pt is s/p R knee incisional debridement and poly revision resulting in the deficits listed below (see PT Problem List).  Pt will benefit from skilled PT to increase their independence and safety with mobility to allow discharge to the venue listed below.  Pt mobilizing very well POD #0 and plans to d/c home tomorrow.  Pt reports he did not keep his RW from previous surgery (also states insurance will not cover another RW at this time) and plans to get a cane or "walking stick" if needed.  Pt agreeable to try cane, practice stairs and go over exercises tomorrow prior to d/c.      Follow Up Recommendations Outpatient PT    Equipment Recommendations  Rolling walker with 5" wheels    Recommendations for Other Services       Precautions / Restrictions Precautions Precautions: Knee Restrictions Other Position/Activity Restrictions: WBAT      Mobility  Bed Mobility Overal bed mobility: Modified Independent                Transfers Overall transfer level: Needs assistance   Transfers: Sit to/from Stand Sit to Stand: Min guard         General transfer comment: verbal cues for hand placement  Ambulation/Gait Ambulation/Gait assistance: Min guard Ambulation Distance (Feet): 400 Feet Assistive device: Rolling walker (2 wheeled) Gait Pattern/deviations: Step-through pattern;Antalgic     General Gait Details: pt reports improvement in R knee pain and movement, pt wished to continue ambulating entire hallway  Stairs            Wheelchair Mobility    Modified Rankin (Stroke Patients Only)       Balance                                             Pertinent Vitals/Pain Pain Assessment:  0-10 Pain Score: 1  Pain Location: R anterior knee Pain Descriptors / Indicators: Sore Pain Intervention(s): Limited activity within patient's tolerance;Monitored during session;Repositioned    Home Living Family/patient expects to be discharged to:: Private residence Living Arrangements: Spouse/significant other Available Help at Discharge: Family Type of Home: House Home Access: Stairs to enter Entrance Stairs-Rails: None Technical brewer of Steps: 3 Home Layout: Two level Home Equipment: None      Prior Function Level of Independence: Independent               Hand Dominance        Extremity/Trunk Assessment               Lower Extremity Assessment: RLE deficits/detail RLE Deficits / Details: R knee AROM supine -2-90* with 100* flexion achieved AAROM       Communication   Communication: No difficulties  Cognition Arousal/Alertness: Awake/alert Behavior During Therapy: WFL for tasks assessed/performed Overall Cognitive Status: Within Functional Limits for tasks assessed                      General Comments      Exercises        Assessment/Plan    PT Assessment Patient needs continued PT services  PT Diagnosis Difficulty  walking   PT Problem List Decreased range of motion;Decreased strength;Decreased mobility  PT Treatment Interventions Functional mobility training;Gait training;Stair training;DME instruction;Therapeutic exercise;Patient/family education   PT Goals (Current goals can be found in the Care Plan section) Acute Rehab PT Goals PT Goal Formulation: With patient Time For Goal Achievement: 02/19/14 Potential to Achieve Goals: Good    Frequency 7X/week   Barriers to discharge        Co-evaluation               End of Session   Activity Tolerance: Patient tolerated treatment well Patient left: in chair;with call bell/phone within reach Nurse Communication: Mobility status    Functional Assessment Tool  Used: clinical judgement Functional Limitation: Mobility: Walking and moving around Mobility: Walking and Moving Around Current Status (A7681): At least 1 percent but less than 20 percent impaired, limited or restricted Mobility: Walking and Moving Around Goal Status [phone]): 0 percent impaired, limited or restricted    Time: 1356-1414 PT Time Calculation (min): 18 min   Charges:   PT Evaluation $Initial PT Evaluation Tier I: 1 Procedure PT Treatments $Gait Training: 8-22 mins   PT G Codes:   Functional Assessment Tool Used: clinical judgement Functional Limitation: Mobility: Walking and moving around    [place] E 02/16/2014, 3:36 PM Carmelia Bake, PT, DPT 02/16/2014 Pager: [phone]

## 2014-02-16 NOTE — Progress Notes (Signed)
Dr. Jillyn Hidden notified of pt's Glucose of 299. She said they would talk to him over in holding and address it then.  No new orders given.

## 2014-02-16 NOTE — Plan of Care (Signed)
Problem: Consults Goal: Diagnosis- Total Joint Replacement Revision Total Knee     

## 2014-02-16 NOTE — Anesthesia Postprocedure Evaluation (Signed)
Anesthesia Post Note  Patient: Seth [facility]  Procedure(s) Performed: Procedure(s) (LRB): RIGHT KNEE INCISIONAL DEBRIDEMENT(SCAR) AND POLY REVISION (Right)  Anesthesia type: General  Patient location: PACU  Post pain: Pain level controlled  Post assessment: Post-op Vital signs reviewed  Last Vitals: BP 117/74  Pulse 51  Temp(Src) 36.3 C (Oral)  Resp 16  Ht 5\' 10"  (1.778 m)  Wt 225 lb 8 oz (102.286 kg)  BMI 32.36 kg/m2  SpO2 97%  Post vital signs: Reviewed  Level of consciousness: sedated  Complications: No apparent anesthesia complications

## 2014-02-16 NOTE — Progress Notes (Addendum)
[facility]   [facility] is providing the following services: Patient declined commode - stating their toilets are high enough at home.  Also declined RW - according to the wife, he received one about 2 years ago that Medicare paid for.  Medicare will not pay for another one within 5 years.  She said that they have access to borrow one.    If patient discharges after hours, please call [phone].   Linward Headland 02/16/2014, 11:48 AM

## 2014-02-16 NOTE — Interval H&P Note (Signed)
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 After consideration of risks, benefits and other options for treatment, the patient has consented to  Procedure(s): RIGHT KNEE INCISIONAL DEBRIDEMENT(SCAR) AND POSSIBLE POLY REVISION (Right) as a surgical intervention .  The patient's history has been reviewed, patient examined, no change in status, stable for surgery.  I have reviewed the patient's chart and labs.  Questions were answered to the patient's satisfaction.     [name]

## 2014-02-17 ENCOUNTER — Encounter (HOSPITAL_COMMUNITY): Payer: Self-pay | Admitting: Orthopedic Surgery

## 2014-02-17 DIAGNOSIS — T84099A Other mechanical complication of unspecified internal joint prosthesis, initial encounter: Secondary | ICD-10-CM | POA: Diagnosis not present

## 2014-02-17 LAB — CBC
HCT: 38.6 % — ABNORMAL LOW (ref 39.0–52.0)
Hemoglobin: 13.7 g/dL (ref 13.0–17.0)
MCH: 30.6 pg (ref 26.0–34.0)
MCHC: 35.5 g/dL (ref 30.0–36.0)
MCV: 86.4 fL (ref 78.0–100.0)
Platelets: 163 10*3/uL (ref 150–400)
RBC: 4.47 MIL/uL (ref 4.22–5.81)
RDW: 12 % (ref 11.5–15.5)
WBC: 12.4 10*3/uL — ABNORMAL HIGH (ref 4.0–10.5)

## 2014-02-17 LAB — GLUCOSE, CAPILLARY: Glucose-Capillary: 267 mg/dL — ABNORMAL HIGH (ref 70–99)

## 2014-02-17 LAB — BASIC METABOLIC PANEL
Anion gap: 11 (ref 5–15)
BUN: 17 mg/dL (ref 6–23)
CO2: 26 mEq/L (ref 19–32)
Calcium: 9.1 mg/dL (ref 8.4–10.5)
Chloride: 98 mEq/L (ref 96–112)
Creatinine, Ser: 0.95 mg/dL (ref 0.50–1.35)
GFR calc Af Amer: >90 mL/min (ref >90)
GFR calc non Af Amer: 87 mL/min — ABNORMAL LOW (ref >90)
Glucose, Bld: 260 mg/dL — ABNORMAL HIGH (ref 70–99)
Potassium: 4.8 mEq/L (ref 3.7–5.3)
Sodium: 135 mEq/L — ABNORMAL LOW (ref 137–147)

## 2014-02-17 MED ORDER — POLYETHYLENE GLYCOL 3350 17 G PO PACK: 17.0000 g | PACK | Freq: Two times a day (BID) | ORAL | Status: DC

## 2014-02-17 MED ORDER — CYCLOBENZAPRINE HCL 10 MG PO TABS: 10.0000 mg | ORAL_TABLET | Freq: Three times a day (TID) | ORAL | Status: DC | PRN

## 2014-02-17 MED ORDER — DSS 100 MG PO CAPS
100.0000 mg | ORAL_CAPSULE | Freq: Two times a day (BID) | ORAL | Status: DC
Start: 2014-02-17 — End: 2016-06-28

## 2014-02-17 MED ORDER — FERROUS SULFATE 325 (65 FE) MG PO TABS: 325.0000 mg | ORAL_TABLET | Freq: Two times a day (BID) | ORAL | Status: DC

## 2014-02-17 MED ORDER — OXYCODONE HCL 5 MG PO TABS: 5.0000 mg | ORAL_TABLET | ORAL | Status: DC | PRN

## 2014-02-17 NOTE — Care Management Note (Signed)
    Page 1 of 1   02/17/2014     2:35:04 PM CARE MANAGEMENT NOTE 02/17/2014  Patient:  Seth [facility], Seth Washington   Account Number:  [id number]  Date Initiated:  02/17/2014  Documentation initiated by:  [facility]  Subjective/Objective Assessment:   adm: RIGHT KNEE INCISIONAL DEBRIDEMENT(SCAR) AND POLY REVISION (Right)     Action/Plan:   pt refuses [place] services and refuses DMe   Anticipated DC Date:  02/17/2014   Anticipated DC Plan:  HOME/SELF CARE         Choice offered to / List presented to:          [facility] arranged  [place] - 11 Patient Refused      Status of service:  Completed, signed off Medicare Important Message given?   (If response is "NO", the following Medicare IM given date fields will be blank) Date Medicare IM given:   Medicare IM given by:   Date Additional Medicare IM given:   Additional Medicare IM given by:    Discharge Disposition:  HOME/SELF CARE  Per UR Regulation:    If discussed at Long Length of Stay Meetings, dates discussed:    Comments:  02/17/14 09:30 CM confirmed pt is refusing any HH services and DMe.  No other CM needs were communicated.  Linus Salmons, BSN, [place].

## 2014-02-17 NOTE — Op Note (Signed)
[name], [name]               ACCOUNT NO.:  [id number]  MEDICAL RECORD NO.:  [id number]  LOCATION:  1612                         FACILITY:  [facility]  PHYSICIAN:  Pietro Cassis. Alvan Dame, M.D.  DATE OF BIRTH:  16-Nov-1950  DATE OF PROCEDURE:  02/16/2014 DATE OF DISCHARGE:                              OPERATIVE REPORT   PREOPERATIVE DIAGNOSIS:  Status post right partial knee arthroplasty with persistent medial base mechanical symptoms with concern for excessive scarring medially.  POSTOPERATIVE DIAGNOSIS:  Status post right partial knee arthroplasty with persistent medial base mechanical symptoms with concern for excessive scarring medially.  PROCEDURE: 1. Open sharp excisional debridement of right knee, extent of his 6-     inch incision including skin, subcutaneous tissue, intra-articular     scar. 2. Revision polyethylene removing the old polyethylene insert in order     for a more complete debridement medially, then revising the     polyethylene is for nearly 2 years out from surgery.  FINDINGS:  There was no evidence of any infection.  No evidence of any significant polyethylene wear debris or 3rd body wear changes to the polyethylene.  SURGEON:  Pietro Cassis. Alvan Dame, M.D.  ASSISTANT:  Danae Orleans, PAC.  Note that [name] is present for the entirety of the case from preoperative positioning, perioperative management of operative extremity, general facilitation of the case, and primary wound closure.  ANESTHESIA:  General LMA.  SPECIMENS:  None.  COMPLICATIONS:  None.  TOURNIQUET:  Per anesthetic record at 31 minutes at 250 mmHg.  DRAINS:  None.  INDICATION FOR PROCEDURE:  Seth [facility] is a 63 year old male with a history of a right partial knee arthroplasty following previous open meniscectomy in his right knee.  This partial knee arthroplasty was performed in December 2013.  He presented recently with persistent mechanical symptoms medially.  We had worked this up  and ruled out infectious process with loosening prospects with the use of a bone scan as his radiographs revealed stable implants.  No evidence of any loosening.  After discussed with him options, I felt that the best way to approach this from my standpoint was from an open procedure, so I could openly visualize all aspects of the anterior and medial aspect of his joint as opposed to arthroscopic treatment.  Discussed our arthrotomy scar debridement, risks of recurrence, scar formation, risks of infection, or blood clots.  After discussing these options, he wished to proceed.  Surgical consent was obtained.  PROCEDURE IN DETAIL:  The patient was brought to the operative theater. Once adequate anesthesia, preoperative antibiotics, Ancef administered, he was positioned supine with the right thigh tourniquet placed.  Right lower extremity was placed over the Oxford leg holder allowing the leg to flex to 120 degrees.  The right lower extremity was then prepped and draped in a sterile fashion.  The time-out performed identifying the patient, planned procedure, and extremity.  The patient's old incision was excised.  Soft tissue planes created.  Median arthrotomy was opened encountering clear yellow synovial fluid.  Initial exposure for routine purposes was carried out noting the anterior and medial scar, I do not have comparative basis to know whether or not  this was normal or excessive.  However, I did recreate the partial exposure of this medial aspect of the knee removing scar.  I did remove the old polyethylene insert which was necessary for me to have adequate visualization along the medial and posterior medial aspect of the joint.  I did not identify residual posterior osteophyte on the medial aspect of the joint which I used an osteotome to remove.  Upon excision of all of the scar tissue along the medial side of the knee, removal of this osteophyte, there was significantly more room  medially with the intact medial collateral ligament.  We continued synovectomy and scar was carried up into the anterior medial aspect of the joint.  Visualization of the anterior compartment revealed stable patellofemoral joint.  The patient's ACL was still intact.  Upon completion of this, excisional debridement of the intra-articular aspect of the joint.  I irrigated the knee with normal saline solution. I then replaced the polyethylene with a new size 5 medial based right partial knee.  Oxford insert was snapped into position without issue.  At this point, the extensor mechanism was reapproximated using #1 Vicryl and 0 V-Loc sutures.  The pericapsular tissues were injected with 20 mL of Exparel, 30 mL of 0.25% Marcaine with epinephrine, and 1 mL of Toradol.  Once the knee joint was closed up, the tourniquet was let down after 31 minutes.  The knee was injected in the case with topical based tranexamic acid.  The remainder of the wound was closed with 2-0 Vicryl and running 4-0 Monocryl.  The knee was cleaned, dried, and dressed sterilely using Dermabond and Aquacel dressing.  He was then brought to the recovery room in stable condition tolerating the procedure well. Findings reviewed with his family.  Keep him in the hospital overnight for IV antibiotics and home in the morning.     Pietro Cassis Alvan Dame, M.D.     MDO/MEDQ  D:  02/16/2014  T:  02/16/2014  Job:  [id number]

## 2014-02-17 NOTE — Discharge Summary (Signed)
Physician Discharge Summary  Patient ID: Seth Washington MRN: [id number] DOB/AGE: July 26, 1950 63 y.o.  Admit date: 02/16/2014 Discharge date: 02/17/2014   Procedures:  Procedure(s) (LRB): RIGHT KNEE INCISIONAL DEBRIDEMENT(SCAR) AND POLY REVISION (Right)  Attending Physician:  Dr. Paralee [facility]   Admission Diagnoses:   Right knee pain S/P right medial UKR  Discharge Diagnoses:  Principal Problem:   S/P right UKA revision Active Problems:   Obese  Past Medical History  Diagnosis Date  . Mental disorder     PTSD  . Hypertension   . Sleep apnea     uses cpap nightly  . Hyperlipidemia   . BPH (benign prostatic hyperplasia)   . Myocardial infarction     stent placed-2009 adn 2006 hx of mi x 3   . GERD (gastroesophageal reflux disease)   . Complication of anesthesia     HEART ATTACK IN THE RECOVERY ROOM AFTER NECK SURGERY  . Diabetes mellitus     type 2 on pills   . Anxiety   . OSA (obstructive sleep apnea)   . Glaucoma   . PUD (peptic ulcer disease)   . PTSD (post-traumatic stress disorder)   . Obesity   . Aortic valve sclerosis     Mild no stenosis  . Coronary artery disease     PCI LAD 10/03, NSTEMI with PCI PDA 2/09, PCI of left circ 5/09, STEMI in setting of post op from spine surgery with nonobstructive thrombus in the diagonal and 70% LAD secondary to spasm with no PCI due to spinal surgery  . History of kidney stones   . Pain     LEFT SHOULDER PAIN - TORN ROTATOR CUFF - PT STATES SURGERY PLANNED IN OCT 2015  . Pain     RIGHT KNEE PAIN - S/P RIGHT UNI KNEE 06-10-12  . Arthritis     generalized   DDD    HPI: Seth [facility], 63 y.o. male, has a history of pain and functional disability in the right knee(s) due to excessive scar and symptomatic catching and patient has failed non-surgical conservative treatments for greater than 12 weeks to include NSAID's and/or analgesics, corticosteriod injections, supervised PT with diminished ADL's post treatment, use of  assistive devices and activity modification. The indications for the incisional debridement and poly exchange of the UKR are pain, excessive scarring and symptomatic catching. Onset of symptoms was gradual starting 1+ years ago with gradually worsening course since that time. Prior procedures on the right knee(s) include unicompartmental arthroplasty. Patient currently rates pain in the right knee(s) at 8 out of 10 when the knee catched. There is worsening of pain with activity and weight bearing, pain that interferes with activities of daily living, pain with passive range of motion, crepitus and joint swelling. Patient has evidence of previous UKR by imaging studies. This condition presents safety issues increasing the risk of falls. There is no current active infection. Risks, benefits and expectations were discussed with the patient. Risks including but not limited to the risk of anesthesia, blood clots, nerve damage, blood vessel damage, failure of the prosthesis, infection and up to and including death. Patient understand the risks, benefits and expectations and wishes to proceed with surgery.   PCP: Seth Casco, MD   Discharged Condition: good  [facility] Course:  Patient underwent the above stated procedure on 02/16/2014. Patient tolerated the procedure well and brought to the recovery room in good condition and subsequently to the floor.  POD #1 BP: 125/64 ; Pulse: 56 ;  Temp: 97.8 F (36.6 C) ; Resp: 16  Patient reports pain as mild, pain very minimal and well controlled. No events throughout the night. Ready to be discharged home. Dorsiflexion/plantar flexion intact, incision: dressing C/D/I, no cellulitis present and compartment soft.   LABS  Basename    HGB  13.7  HCT  38.6    Discharge Exam: General appearance: alert, cooperative and no distress Extremities: Homans sign is negative, no sign of DVT, no edema, redness or tenderness in the calves or thighs and no ulcers,  gangrene or trophic changes  Disposition: Home with follow up in 2 weeks   Follow-up Information   Follow up with Seth Pole, MD. Schedule an appointment as soon as possible for a visit in 2 weeks.   Specialty:  Orthopedic Surgery   Contact information:   [address] [place] [address] [phone]       Discharge Instructions   Call MD / Call 911    Complete by:  As directed   If you experience chest pain or shortness of breath, CALL 911 and be transported to the [facility] emergency room.  If you develope a fever above 101 F, pus (white drainage) or increased drainage or redness at the wound, or calf pain, call your surgeon's office.     Change dressing    Complete by:  As directed   Maintain surgical dressing for 10-14 days, or until follow up in the clinic.     Constipation Prevention    Complete by:  As directed   Drink plenty of fluids.  Prune juice may be helpful.  You may use a stool softener, such as Colace (over the counter) 100 mg twice a day.  Use MiraLax (over the counter) for constipation as needed.     Diet - low sodium heart healthy    Complete by:  As directed      Discharge instructions    Complete by:  As directed   Maintain surgical dressing for 10-14 days, or until follow up in the clinic. Follow up in 2 weeks at [facility]. Call with any questions or concerns.     Driving restrictions    Complete by:  As directed   No driving for 4 weeks     Increase activity slowly as tolerated    Complete by:  As directed      TED hose    Complete by:  As directed   Use stockings (TED hose) for 2 weeks on both leg(s).  You may remove them at night for sleeping.     Weight bearing as tolerated    Complete by:  As directed   Laterality:  right  Extremity:  Lower             Medication List         aspirin 81 MG tablet  Take 81 mg by mouth at bedtime. Instructed to stay on per SethTurner MD,and Dr Seth [facility]     atorvastatin 40  MG tablet  Commonly known as:  LIPITOR  Take 40 mg by mouth at bedtime.     clopidogrel 75 MG tablet  Commonly known as:  PLAVIX  Take 75 mg by mouth at bedtime.     cyclobenzaprine 10 MG tablet  Commonly known as:  FLEXERIL  Take 1 tablet (10 mg total) by mouth 3 (three) times daily as needed for muscle spasms.     DSS 100 MG Caps  Take 100 mg by mouth  2 (two) times daily.     ezetimibe 10 MG tablet  Commonly known as:  ZETIA  Take 10 mg by mouth at bedtime.     ferrous sulfate 325 (65 FE) MG tablet  Take 1 tablet (325 mg total) by mouth 2 (two) times daily with a meal.     fluticasone 50 MCG/ACT nasal spray  Commonly known as:  FLONASE  Place 1 spray into both nostrils daily as needed for allergies.     hyoscyamine 0.125 MG tablet  Commonly known as:  LEVSIN, ANASPAZ  Take 1 tablet (0.125 mg total) by mouth every 4 (four) hours as needed (bladder spasms).     losartan 25 MG tablet  Commonly known as:  COZAAR  Take 0.5 tablets (12.5 mg total) by mouth daily.     metoprolol 50 MG tablet  Commonly known as:  LOPRESSOR  Take 50 mg by mouth at bedtime.     nitroGLYCERIN 0.4 MG SL tablet  Commonly known as:  NITROSTAT  Place 0.4 mg under the tongue every 5 (five) minutes as needed for chest pain.     omeprazole 20 MG capsule  Commonly known as:  PRILOSEC  Take 20 mg by mouth at bedtime.     ONGLYZA 5 MG Tabs tablet  Generic drug:  saxagliptin HCl  Take 5 mg by mouth at bedtime.     oxyCODONE 5 MG immediate release tablet  Commonly known as:  Oxy IR/ROXICODONE  Take 1-3 tablets (5-15 mg total) by mouth every 4 (four) hours as needed for severe pain.     polyethylene glycol packet  Commonly known as:  MIRALAX / GLYCOLAX  Take 17 g by mouth 2 (two) times daily.     QUEtiapine 25 MG tablet  Commonly known as:  SEROQUEL  Take 25 mg by mouth at bedtime.     sertraline 50 MG tablet  Commonly known as:  ZOLOFT  Take 100 mg by mouth at bedtime.     traZODone 150  MG tablet  Commonly known as:  DESYREL  Take 150 mg by mouth at bedtime.         Signed: [place] P[place]. Mohsen Odenthal   PA-C  02/17/2014, 2:47 PM

## 2014-02-17 NOTE — Progress Notes (Signed)
Physical Therapy Treatment and Discharge from Acute PT Patient Details Name: Seth Washington MRN: [id number] DOB: [birth year] Today's Date: 02/17/2014    History of Present Illness Pt is a 63 year old male s/p R knee incisional debridement and poly revision.    PT Comments    Pt mobilizing very well without assistive device.  Pt able to traverse stairs without difficulty.  Pt able to perform 120* R knee flexion at least with observation.  Pt had no further questions/concerns and ready for d/c home.  Follow Up Recommendations  No PT follow up     Equipment Recommendations  None recommended by PT    Recommendations for Other Services       Precautions / Restrictions Precautions Precautions: Knee Restrictions Other Position/Activity Restrictions: WBAT    Mobility  Bed Mobility Overal bed mobility: Independent                Transfers Overall transfer level: Independent                  Ambulation/Gait Ambulation/Gait assistance: Modified independent (Device/Increase time) Ambulation Distance (Feet): 500 Feet Assistive device: None Gait Pattern/deviations: Antalgic;Decreased stance time - right;Decreased step length - left     General Gait Details: pt doing well without assistive device, slightly antalgic gait but pt reports no pain   Stairs Stairs: Yes Stairs assistance: Supervision Stair Management: Alternating pattern;Forwards;One rail Right Number of Stairs: 4 General stair comments: no difficulty observed with stairs, aware of sequence if having more pain  Wheelchair Mobility    Modified Rankin (Stroke Patients Only)       Balance                                    Cognition Arousal/Alertness: Awake/alert Behavior During Therapy: WFL for tasks assessed/performed Overall Cognitive Status: Within Functional Limits for tasks assessed                      Exercises Total Joint Exercises Heel Slides:  Supine;AROM;Right;15 reps Hip ABduction/ADduction: AROM;Right;15 reps;Standing Knee Flexion: Standing;AROM;Both;10 reps (mini squats) General Exercises - Lower Extremity Heel Raises: AROM;Both;15 reps    General Comments        Pertinent Vitals/Pain Pain Assessment: No/denies pain    Home Living                      Prior Function            PT Goals (current goals can now be found in the care plan section) Progress towards PT goals: Goals met/education completed, patient discharged from PT    Frequency       PT Plan Other (comment) (d/c from PT)    Co-evaluation             End of Session   Activity Tolerance: Patient tolerated treatment well Patient left: in chair;with call bell/phone within reach     Time: 0841-0854 PT Time Calculation (min): 13 min  Charges:  $Gait Training: 8-22 mins                    G Codes:  Functional Assessment Tool Used: clinical judgement Functional Limitation: Mobility: Walking and moving around Mobility: Walking and Moving Around Goal Status [phone]): 0 percent impaired, limited or restricted Mobility: Walking and Moving Around Discharge Status [phone]): 0 percent impaired, limited or restricted  Cosmo Tetreault,KATHrine E 02/17/2014, 10:59 AM Carmelia Bake, PT, DPT 02/17/2014 Pager: [phone]

## 2014-02-17 NOTE — Progress Notes (Signed)
OT Cancellation Note  Patient Details Name: Seth Washington MRN: [id number] DOB: 1950-06-26   Cancelled Treatment:    Reason Eval/Treat Not Completed: Other (comment) Pt states he has no problems with ADL including toilet transfers and LB dressing. Wife present and confirms that pt is doing well and she can help PRN. He states he has been up in the room and to the bathroom without difficulty. He states he doesn't anticipate any difficulty with shower transfer at home and declines need to practice.  [name] [phone] 02/17/2014, 9:47 AM

## 2014-02-17 NOTE — Progress Notes (Signed)
     Subjective: 1 Day Post-Op Procedure(s) (LRB): RIGHT KNEE INCISIONAL DEBRIDEMENT(SCAR) AND POLY REVISION (Right)    Seen by Dr. Alvan Dame. Patient reports pain as mild, pain very minimal and well controlled. No events throughout the night. Ready to be discharged home.  Objective:   VITALS:   Filed Vitals:   02/17/14 0535  BP: 125/64  Pulse: 56  Temp: 97.8 F (36.6 C)  Resp: 16    Dorsiflexion/Plantar flexion intact Incision: dressing C/D/I No cellulitis present Compartment soft  LABS  Recent Labs  02/17/14 0444  HGB 13.7  HCT 38.6*  WBC 12.4*  PLT 163     Recent Labs  02/17/14 0444  NA 135*  K 4.8  BUN 17  CREATININE 0.95  GLUCOSE 260*     Assessment/Plan: 1 Day Post-Op Procedure(s) (LRB): RIGHT KNEE INCISIONAL DEBRIDEMENT(SCAR) AND POLY REVISION (Right) Advance diet Up with therapy D/C IV fluids Discharge home with home health Follow up in 2 weeks at [facility]. Follow up with OLIN,Donnette Macmullen D in 2 weeks.  Contact information:  [facility] [address] [place] [phone]    Obese (BMI 30-39.9) Estimated body mass index is 32.36 kg/(m^2) as calculated from the following:   Height as of this encounter: 5\' 10"  (1.778 m).   Weight as of this encounter: 102.286 kg (225 lb 8 oz). Patient also counseled that weight may inhibit the healing process Patient counseled that losing weight will help with future health issues        West Pugh. Abigal Choung   PAC  02/17/2014, 9:37 AM

## 2014-02-17 NOTE — Progress Notes (Signed)
Utilization Review Completed.Seth Washington T9/15/2015  

## 2014-03-03 DIAGNOSIS — N200 Calculus of kidney: Secondary | ICD-10-CM | POA: Diagnosis not present

## 2014-03-03 DIAGNOSIS — R3915 Urgency of urination: Secondary | ICD-10-CM | POA: Diagnosis not present

## 2014-03-04 DIAGNOSIS — Z96659 Presence of unspecified artificial knee joint: Secondary | ICD-10-CM | POA: Diagnosis not present

## 2014-03-04 DIAGNOSIS — M25869 Other specified joint disorders, unspecified knee: Secondary | ICD-10-CM | POA: Diagnosis not present

## 2014-03-04 DIAGNOSIS — Z471 Aftercare following joint replacement surgery: Secondary | ICD-10-CM | POA: Diagnosis not present

## 2014-03-13 ENCOUNTER — Ambulatory Visit (HOSPITAL_COMMUNITY)
Admission: RE | Admit: 2014-03-13 | Discharge: 2014-03-13 | Disposition: A | Discharge status: Home / Self Care | Payer: Medicare Other | Source: Ambulatory Visit | Attending: Cardiology | Admitting: Cardiology

## 2014-03-13 ENCOUNTER — Other Ambulatory Visit (HOSPITAL_COMMUNITY): Payer: Self-pay | Admitting: Orthopedic Surgery

## 2014-03-13 DIAGNOSIS — Z471 Aftercare following joint replacement surgery: Secondary | ICD-10-CM | POA: Insufficient documentation

## 2014-03-13 DIAGNOSIS — M25561 Pain in right knee: Secondary | ICD-10-CM | POA: Insufficient documentation

## 2014-03-13 DIAGNOSIS — M79609 Pain in unspecified limb: Secondary | ICD-10-CM

## 2014-03-13 DIAGNOSIS — Z96651 Presence of right artificial knee joint: Secondary | ICD-10-CM | POA: Insufficient documentation

## 2014-03-13 DIAGNOSIS — M79604 Pain in right leg: Secondary | ICD-10-CM

## 2014-03-13 NOTE — Progress Notes (Signed)
Right Lower Extremity Venous Duplex Completed. No evidence for DVT or SVT. °Brianna L Mazza,RVT °

## 2014-03-18 DIAGNOSIS — N200 Calculus of kidney: Secondary | ICD-10-CM | POA: Diagnosis not present

## 2014-03-26 DIAGNOSIS — G8918 Other acute postprocedural pain: Secondary | ICD-10-CM | POA: Diagnosis not present

## 2014-03-26 DIAGNOSIS — M67912 Unspecified disorder of synovium and tendon, left shoulder: Secondary | ICD-10-CM | POA: Diagnosis not present

## 2014-03-26 DIAGNOSIS — M13812 Other specified arthritis, left shoulder: Secondary | ICD-10-CM | POA: Diagnosis not present

## 2014-03-26 DIAGNOSIS — M24812 Other specific joint derangements of left shoulder, not elsewhere classified: Secondary | ICD-10-CM | POA: Diagnosis not present

## 2014-03-26 DIAGNOSIS — M7542 Impingement syndrome of left shoulder: Secondary | ICD-10-CM | POA: Diagnosis not present

## 2014-03-26 DIAGNOSIS — M75102 Unspecified rotator cuff tear or rupture of left shoulder, not specified as traumatic: Secondary | ICD-10-CM | POA: Diagnosis not present

## 2014-03-27 ENCOUNTER — Telehealth (HOSPITAL_COMMUNITY): Payer: Self-pay | Admitting: *Deleted

## 2014-04-02 DIAGNOSIS — R3915 Urgency of urination: Secondary | ICD-10-CM | POA: Diagnosis not present

## 2014-04-02 DIAGNOSIS — N200 Calculus of kidney: Secondary | ICD-10-CM | POA: Diagnosis not present

## 2014-04-03 DIAGNOSIS — Z4789 Encounter for other orthopedic aftercare: Secondary | ICD-10-CM | POA: Diagnosis not present

## 2014-04-03 DIAGNOSIS — M75112 Incomplete rotator cuff tear or rupture of left shoulder, not specified as traumatic: Secondary | ICD-10-CM | POA: Diagnosis not present

## 2014-04-07 DIAGNOSIS — M75112 Incomplete rotator cuff tear or rupture of left shoulder, not specified as traumatic: Secondary | ICD-10-CM | POA: Diagnosis not present

## 2014-04-09 DIAGNOSIS — M75112 Incomplete rotator cuff tear or rupture of left shoulder, not specified as traumatic: Secondary | ICD-10-CM | POA: Diagnosis not present

## 2014-04-14 DIAGNOSIS — M75112 Incomplete rotator cuff tear or rupture of left shoulder, not specified as traumatic: Secondary | ICD-10-CM | POA: Diagnosis not present

## 2014-04-16 DIAGNOSIS — M75112 Incomplete rotator cuff tear or rupture of left shoulder, not specified as traumatic: Secondary | ICD-10-CM | POA: Diagnosis not present

## 2014-04-21 DIAGNOSIS — M75112 Incomplete rotator cuff tear or rupture of left shoulder, not specified as traumatic: Secondary | ICD-10-CM | POA: Diagnosis not present

## 2014-04-23 DIAGNOSIS — M75112 Incomplete rotator cuff tear or rupture of left shoulder, not specified as traumatic: Secondary | ICD-10-CM | POA: Diagnosis not present

## 2014-04-28 DIAGNOSIS — M75112 Incomplete rotator cuff tear or rupture of left shoulder, not specified as traumatic: Secondary | ICD-10-CM | POA: Diagnosis not present

## 2014-05-05 DIAGNOSIS — M75112 Incomplete rotator cuff tear or rupture of left shoulder, not specified as traumatic: Secondary | ICD-10-CM | POA: Diagnosis not present

## 2014-05-05 DIAGNOSIS — H524 Presbyopia: Secondary | ICD-10-CM | POA: Diagnosis not present

## 2014-05-05 DIAGNOSIS — H40053 Ocular hypertension, bilateral: Secondary | ICD-10-CM | POA: Diagnosis not present

## 2014-05-12 DIAGNOSIS — M75112 Incomplete rotator cuff tear or rupture of left shoulder, not specified as traumatic: Secondary | ICD-10-CM | POA: Diagnosis not present

## 2014-05-14 DIAGNOSIS — Z0000 Encounter for general adult medical examination without abnormal findings: Secondary | ICD-10-CM | POA: Diagnosis not present

## 2014-05-14 DIAGNOSIS — I2510 Atherosclerotic heart disease of native coronary artery without angina pectoris: Secondary | ICD-10-CM | POA: Diagnosis not present

## 2014-05-14 DIAGNOSIS — G479 Sleep disorder, unspecified: Secondary | ICD-10-CM | POA: Diagnosis not present

## 2014-05-14 DIAGNOSIS — G4733 Obstructive sleep apnea (adult) (pediatric): Secondary | ICD-10-CM | POA: Diagnosis not present

## 2014-05-14 DIAGNOSIS — I119 Hypertensive heart disease without heart failure: Secondary | ICD-10-CM | POA: Diagnosis not present

## 2014-05-14 DIAGNOSIS — E291 Testicular hypofunction: Secondary | ICD-10-CM | POA: Diagnosis not present

## 2014-05-14 DIAGNOSIS — E780 Pure hypercholesterolemia: Secondary | ICD-10-CM | POA: Diagnosis not present

## 2014-05-14 DIAGNOSIS — E1169 Type 2 diabetes mellitus with other specified complication: Secondary | ICD-10-CM | POA: Diagnosis not present

## 2014-05-19 DIAGNOSIS — M75112 Incomplete rotator cuff tear or rupture of left shoulder, not specified as traumatic: Secondary | ICD-10-CM | POA: Diagnosis not present

## 2014-05-20 DIAGNOSIS — Z86018 Personal history of other benign neoplasm: Secondary | ICD-10-CM | POA: Diagnosis not present

## 2014-05-20 DIAGNOSIS — L821 Other seborrheic keratosis: Secondary | ICD-10-CM | POA: Diagnosis not present

## 2014-05-20 DIAGNOSIS — D229 Melanocytic nevi, unspecified: Secondary | ICD-10-CM | POA: Diagnosis not present

## 2014-05-20 DIAGNOSIS — Z808 Family history of malignant neoplasm of other organs or systems: Secondary | ICD-10-CM | POA: Diagnosis not present

## 2014-05-20 DIAGNOSIS — L570 Actinic keratosis: Secondary | ICD-10-CM | POA: Diagnosis not present

## 2014-05-21 DIAGNOSIS — M75112 Incomplete rotator cuff tear or rupture of left shoulder, not specified as traumatic: Secondary | ICD-10-CM | POA: Diagnosis not present

## 2014-06-02 DIAGNOSIS — M75112 Incomplete rotator cuff tear or rupture of left shoulder, not specified as traumatic: Secondary | ICD-10-CM | POA: Diagnosis not present

## 2014-06-03 ENCOUNTER — Encounter: Payer: Self-pay | Admitting: Cardiology

## 2014-06-04 DIAGNOSIS — M75112 Incomplete rotator cuff tear or rupture of left shoulder, not specified as traumatic: Secondary | ICD-10-CM | POA: Diagnosis not present

## 2014-06-08 DIAGNOSIS — Z09 Encounter for follow-up examination after completed treatment for conditions other than malignant neoplasm: Secondary | ICD-10-CM | POA: Diagnosis not present

## 2014-06-08 DIAGNOSIS — K5730 Diverticulosis of large intestine without perforation or abscess without bleeding: Secondary | ICD-10-CM | POA: Diagnosis not present

## 2014-06-08 DIAGNOSIS — Z1211 Encounter for screening for malignant neoplasm of colon: Secondary | ICD-10-CM | POA: Diagnosis not present

## 2014-06-08 DIAGNOSIS — Z86010 Personal history of colonic polyps: Secondary | ICD-10-CM | POA: Diagnosis not present

## 2014-06-09 DIAGNOSIS — M75112 Incomplete rotator cuff tear or rupture of left shoulder, not specified as traumatic: Secondary | ICD-10-CM | POA: Diagnosis not present

## 2014-06-11 DIAGNOSIS — M75112 Incomplete rotator cuff tear or rupture of left shoulder, not specified as traumatic: Secondary | ICD-10-CM | POA: Diagnosis not present

## 2014-06-15 DIAGNOSIS — Z4789 Encounter for other orthopedic aftercare: Secondary | ICD-10-CM | POA: Diagnosis not present

## 2014-06-16 DIAGNOSIS — M75112 Incomplete rotator cuff tear or rupture of left shoulder, not specified as traumatic: Secondary | ICD-10-CM | POA: Diagnosis not present

## 2014-06-18 DIAGNOSIS — M75112 Incomplete rotator cuff tear or rupture of left shoulder, not specified as traumatic: Secondary | ICD-10-CM | POA: Diagnosis not present

## 2014-06-23 DIAGNOSIS — M75112 Incomplete rotator cuff tear or rupture of left shoulder, not specified as traumatic: Secondary | ICD-10-CM | POA: Diagnosis not present

## 2014-06-24 DIAGNOSIS — M75112 Incomplete rotator cuff tear or rupture of left shoulder, not specified as traumatic: Secondary | ICD-10-CM | POA: Diagnosis not present

## 2014-06-24 DIAGNOSIS — Z4789 Encounter for other orthopedic aftercare: Secondary | ICD-10-CM | POA: Diagnosis not present

## 2014-08-05 DIAGNOSIS — M75112 Incomplete rotator cuff tear or rupture of left shoulder, not specified as traumatic: Secondary | ICD-10-CM | POA: Diagnosis not present

## 2014-08-05 DIAGNOSIS — Z4789 Encounter for other orthopedic aftercare: Secondary | ICD-10-CM | POA: Diagnosis not present

## 2014-08-09 NOTE — Progress Notes (Signed)
Cardiology Office Note   Date:  08/10/2014   ID:  Seth Washington, DOB 1951/03/07, MRN [id number]  PCP:  Osborne Casco, MD  Cardiologist:   Sueanne Margarita, MD   Chief Complaint  Patient presents with  . Coronary Artery Disease  . Hypertension  . Hyperlipidemia      History of Present Illness: Seth Washington is a 64 y.o. male with a history of ASCAD, dyslipidemia,  obesity and HTN who presents today for followup. He is doing well. He denies any chest pain, SOB, DOE, LE edema, palpitations or syncope.  He has not been getting any aerobic exercise and has gained weight and has chronic knee pain.    Past Medical History  Diagnosis Date  . Mental disorder     PTSD  . Hypertension   . Sleep apnea     uses cpap nightly  . Hyperlipidemia   . BPH (benign prostatic hyperplasia)   . Myocardial infarction     stent placed-2009 adn 2006 hx of mi x 3   . GERD (gastroesophageal reflux disease)   . Complication of anesthesia     HEART ATTACK IN THE RECOVERY ROOM AFTER NECK SURGERY  . Diabetes mellitus     type 2 on pills   . Anxiety   . OSA (obstructive sleep apnea)   . Glaucoma   . PUD (peptic ulcer disease)   . PTSD (post-traumatic stress disorder)   . Obesity   . Aortic valve sclerosis     Mild no stenosis  . Coronary artery disease     PCI LAD 10/03, NSTEMI with PCI PDA 2/09, PCI of left circ 5/09, STEMI in setting of post op from spine surgery with nonobstructive thrombus in the diagonal and 70% LAD secondary to spasm with no PCI due to spinal surgery  . History of kidney stones   . Pain     LEFT SHOULDER PAIN - TORN ROTATOR CUFF - PT STATES SURGERY PLANNED IN OCT 2015  . Pain     RIGHT KNEE PAIN - S/P RIGHT UNI KNEE 06-10-12  . Arthritis     generalized   DDD    Past Surgical History  Procedure Laterality Date  . Cardiac catheterization      4/11 ,PTCA w/stent '03,'09  . Cervical discectomy  4/11    FUSION  . Arthroscopy  rt shoulder-2007  . Knee  arthroscopy  1970's  . Coronary angioplasty      stents in 2006 and 2009   . Right shoulder subacromial decompression  05/23/11    . Knee arthroscopy with lateral menisectomy  06/10/2012    Procedure: KNEE ARTHROSCOPY WITH LATERAL MENISECTOMY;  Surgeon: [name], MD;  Location: [facility];  Service: Orthopedics;  Laterality: Right;  . Partial knee arthroplasty  06/10/2012    Procedure: UNICOMPARTMENTAL KNEE;  Surgeon: [name], MD;  Location: [facility];  Service: Orthopedics;  Laterality: Right;  Right Medial Uni Knee  . Cystoscopy with retrograde pyelogram, ureteroscopy and stent placement Right 08/13/2013    Procedure: CYSTOSCOPY WITH RETROGRADE PYELOGRAM, URETEROSCOPY AND STENT PLACEMENT;  Surgeon: [name], MD;  Location: [facility];  Service: Urology;  Laterality: Right;  . Holmium laser application Right 3/55/7322    Procedure: HOLMIUM LASER APPLICATION;  Surgeon: [name], MD;  Location: [facility];  Service: Urology;  Laterality: Right;  . Total knee revision with scar debridement/patella revision with poly exchange Right 02/16/2014    Procedure: RIGHT KNEE INCISIONAL  DEBRIDEMENT(SCAR) AND POLY REVISION;  Surgeon: [name], MD;  Location: [facility];  Service: Orthopedics;  Laterality: Right;     Current Outpatient Prescriptions  Medication Sig Dispense Refill  . aspirin 81 MG tablet Take 81 mg by mouth at bedtime. Instructed to stay on per [name],and Dr [name]    . atorvastatin (LIPITOR) 40 MG tablet Take 40 mg by mouth at bedtime.    . clopidogrel (PLAVIX) 75 MG tablet Take 75 mg by mouth at bedtime.     . cyclobenzaprine (FLEXERIL) 10 MG tablet Take 1 tablet (10 mg total) by mouth 3 (three) times daily as needed for muscle spasms. 30 tablet 0  . docusate sodium 100 MG CAPS Take 100 mg by mouth 2 (two) times daily. 10 capsule 0  . ezetimibe (ZETIA) 10 MG tablet Take 10 mg by mouth at bedtime.     . ferrous sulfate 325 (65 FE) MG tablet Take 1 tablet (325 mg  total) by mouth 2 (two) times daily with a meal.  3  . fluticasone (FLONASE) 50 MCG/ACT nasal spray Place 1 spray into both nostrils daily as needed for allergies.     . hyoscyamine (LEVSIN, ANASPAZ) 0.125 MG tablet Take 1 tablet (0.125 mg total) by mouth every 4 (four) hours as needed (bladder spasms). 40 tablet 4  . losartan (COZAAR) 25 MG tablet Take 0.5 tablets (12.5 mg total) by mouth daily. 45 tablet 3  . metoprolol (LOPRESSOR) 50 MG tablet Take 50 mg by mouth at bedtime.     . nitroGLYCERIN (NITROSTAT) 0.4 MG SL tablet Place 0.4 mg under the tongue every 5 (five) minutes as needed for chest pain.    Marland Kitchen omeprazole (PRILOSEC) 20 MG capsule Take 20 mg by mouth at bedtime.     Marland Kitchen oxyCODONE (OXY IR/ROXICODONE) 5 MG immediate release tablet Take 1-3 tablets (5-15 mg total) by mouth every 4 (four) hours as needed for severe pain. 120 tablet 0  . polyethylene glycol (MIRALAX / GLYCOLAX) packet Take 17 g by mouth 2 (two) times daily. 14 each 0  . QUEtiapine (SEROQUEL) 25 MG tablet Take 25 mg by mouth at bedtime.     . saxagliptin HCl (ONGLYZA) 5 MG TABS tablet Take 5 mg by mouth at bedtime.     . sertraline (ZOLOFT) 50 MG tablet Take 100 mg by mouth at bedtime.     . traZODone (DESYREL) 150 MG tablet Take 150 mg by mouth at bedtime.      No current facility-administered medications for this visit.    Allergies:   Ace inhibitors; Metformin and related; and Niacin and related    Social History:  The patient  reports that he quit smoking about 6 years ago. He has never used smokeless tobacco. He reports that he does not drink alcohol or use illicit drugs.   Family History:  The patient's family history includes Diabetes in his father; Hypertension in his father.    ROS:  Please see the history of present illness.   Otherwise, review of systems are positive for none.   All other systems are reviewed and negative.   PHYSICAL EXAM: VS:  BP 110/68 mmHg  Pulse 66  Ht 5\' 10"  (1.778 m)  Wt 232 lb  6.4 oz (105.416 kg)  BMI 33.35 kg/m2  SpO2 94% , BMI Body mass index is 33.35 kg/(m^2). GEN: Well nourished, well developed, in no acute distress HEENT: normal Neck: no JVD, carotid bruits, or masses Cardiac: RRR; no murmurs, rubs,  or gallops,no edema  Respiratory:  clear to auscultation bilaterally, normal work of breathing GI: soft, nontender, nondistended, + BS MS: no deformity or atrophy Skin: warm and dry, no rash Neuro:  Strength and sensation are intact Psych: euthymic mood, full affect   EKG:  EKG is not ordered today.    Recent Labs: 02/11/2014: ALT 36 02/17/2014: BUN 17; Creatinine 0.95; Hemoglobin 13.7; Platelets 163; Potassium 4.8; Sodium 135*    Lipid Panel    Component Value Date/Time   CHOL 126 02/11/2014 0855   TRIG 181.0* 02/11/2014 0855   HDL 31.90* 02/11/2014 0855   CHOLHDL 4 02/11/2014 0855   VLDL 36.2 02/11/2014 0855   LDLCALC 58 02/11/2014 0855      Wt Readings from Last 3 Encounters:  08/10/14 232 lb 6.4 oz (105.416 kg)  02/16/14 225 lb 8 oz (102.286 kg)  02/11/14 225 lb 12.8 oz (102.422 kg)     ASSESSMENT and PLAN:  1. ASCAD with no angina - continue ASA/Plavix  2. HTN - well controlled - continue metoprolol/losartan - check BMET 3. Dyslipidemia - LDL 58 on lipid check 9/15 - continue Atorvastatin/Zetia  - check FLP and ALT 4.  Obesity - I have encouraged him to get back into a routine exercise program as his knee will tolerate  5. Mild LV dysfunction EF 40-45% by echo with minimal schemia on last stress myoview. He has no anginal symptoms. He appears euvolemic on exam. He has normal renal function.    Current medicines are reviewed at length with the patient today.  The patient does not have concerns regarding medicines.  The following changes have been made:  no change  Labs/ tests ordered today include: BMET     Disposition:   FU with me in 6 months   Signed, Sueanne Margarita, MD  08/10/2014 11:23 AM    [facility] [address], [place], [place]  [address] Phone: [phone]; Fax: [phone]

## 2014-08-10 ENCOUNTER — Ambulatory Visit (INDEPENDENT_AMBULATORY_CARE_PROVIDER_SITE_OTHER): Payer: Medicare Other | Admitting: Cardiology

## 2014-08-10 ENCOUNTER — Encounter: Payer: Self-pay | Admitting: Cardiology

## 2014-08-10 VITALS — BP 110/68 | HR 66 | Ht 70.0 in | Wt 232.4 lb

## 2014-08-10 DIAGNOSIS — I10 Essential (primary) hypertension: Secondary | ICD-10-CM | POA: Diagnosis not present

## 2014-08-10 DIAGNOSIS — I420 Dilated cardiomyopathy: Secondary | ICD-10-CM

## 2014-08-10 DIAGNOSIS — E7800 Pure hypercholesterolemia, unspecified: Secondary | ICD-10-CM

## 2014-08-10 DIAGNOSIS — I2510 Atherosclerotic heart disease of native coronary artery without angina pectoris: Secondary | ICD-10-CM | POA: Diagnosis not present

## 2014-08-10 DIAGNOSIS — G4733 Obstructive sleep apnea (adult) (pediatric): Secondary | ICD-10-CM

## 2014-08-10 NOTE — Patient Instructions (Signed)
Your physician recommends that you return for FASTING lab work next Wednesday, March 16. You may come ANY TIME between 7:30 AM and 5:00 PM.  Your physician wants you to follow-up in: 6 months with Dr. Radford Pax. You will receive a reminder letter in the mail two months in advance. If you don't receive a letter, please call our office to schedule the follow-up appointment.

## 2014-08-17 ENCOUNTER — Other Ambulatory Visit: Payer: Medicare Other

## 2014-08-19 ENCOUNTER — Other Ambulatory Visit (INDEPENDENT_AMBULATORY_CARE_PROVIDER_SITE_OTHER): Payer: Medicare Other | Admitting: *Deleted

## 2014-08-19 DIAGNOSIS — E7800 Pure hypercholesterolemia, unspecified: Secondary | ICD-10-CM

## 2014-08-19 DIAGNOSIS — I10 Essential (primary) hypertension: Secondary | ICD-10-CM | POA: Diagnosis not present

## 2014-08-19 LAB — HEPATIC FUNCTION PANEL
ALT: 31 U/L (ref 0–53)
AST: 28 U/L (ref 0–37)
Albumin: 4.5 g/dL (ref 3.5–5.2)
Alkaline Phosphatase: 47 U/L (ref 39–117)
BILIRUBIN DIRECT: 0.1 mg/dL (ref 0.0–0.3)
BILIRUBIN TOTAL: 0.6 mg/dL (ref 0.2–1.2)
TOTAL PROTEIN: 7.3 g/dL (ref 6.0–8.3)

## 2014-08-19 LAB — BASIC METABOLIC PANEL
BUN: 14 mg/dL (ref 6–23)
CHLORIDE: 100 meq/L (ref 96–112)
CO2: 31 mEq/L (ref 19–32)
Calcium: 9.5 mg/dL (ref 8.4–10.5)
Creatinine, Ser: 1.05 mg/dL (ref 0.40–1.50)
GFR: 75.89 mL/min (ref >60.00)
GLUCOSE: 179 mg/dL — AB (ref 70–99)
Potassium: 4.8 mEq/L (ref 3.5–5.1)
Sodium: 135 mEq/L (ref 135–145)

## 2014-08-19 LAB — LIPID PANEL
Cholesterol: 207 mg/dL — ABNORMAL HIGH (ref 0–200)
HDL: 35.3 mg/dL — ABNORMAL LOW (ref >39.00)
NonHDL: 171.7
Total CHOL/HDL Ratio: 6
Triglycerides: 250 mg/dL — ABNORMAL HIGH (ref 0.0–149.0)
VLDL: 50 mg/dL — ABNORMAL HIGH (ref 0.0–40.0)

## 2014-08-19 LAB — LDL CHOLESTEROL, DIRECT: Direct LDL: 147 mg/dL

## 2014-08-20 ENCOUNTER — Telehealth: Payer: Self-pay

## 2014-08-20 DIAGNOSIS — E7800 Pure hypercholesterolemia, unspecified: Secondary | ICD-10-CM

## 2014-08-20 MED ORDER — ATORVASTATIN CALCIUM 80 MG PO TABS: 80.0000 mg | ORAL_TABLET | Freq: Every day | ORAL | Status: AC

## 2014-08-20 NOTE — Telephone Encounter (Signed)
Informed patient of results and verbal understanding expressed.  Instructed patient to INCREASE Lipitor to 80 mg daily. Fasting lab appointment scheduled for 5/9.  Patient agrees with treatment plan.

## 2014-08-20 NOTE — Telephone Encounter (Signed)
-----   Message from Sueanne Margarita, MD sent at 08/19/2014  3:58 PM EDT ----- LDL  Not at goal - increase lipitor to 80mg  daily and recheck FLP and ALT in 6 weeks and forward to PCP

## 2014-10-12 ENCOUNTER — Other Ambulatory Visit (INDEPENDENT_AMBULATORY_CARE_PROVIDER_SITE_OTHER): Payer: Medicare Other | Admitting: *Deleted

## 2014-10-12 DIAGNOSIS — E7800 Pure hypercholesterolemia, unspecified: Secondary | ICD-10-CM

## 2014-10-12 LAB — LIPID PANEL
CHOL/HDL RATIO: 3
Cholesterol: 110 mg/dL (ref 0–200)
HDL: 35.1 mg/dL — ABNORMAL LOW (ref >39.00)
LDL Cholesterol: 56 mg/dL (ref 0–99)
NONHDL: 74.9
Triglycerides: 93 mg/dL (ref 0.0–149.0)
VLDL: 18.6 mg/dL (ref 0.0–40.0)

## 2014-10-12 LAB — ALT: ALT: 32 U/L (ref 0–53)

## 2014-10-13 ENCOUNTER — Telehealth: Payer: Self-pay

## 2014-10-13 NOTE — Telephone Encounter (Signed)
-----   Message from Sueanne Margarita, MD sent at 10/12/2014  8:57 PM EDT ----- Please let patient know that labs were normal.  Continue current medical therapy.

## 2014-10-13 NOTE — Telephone Encounter (Signed)
Informed patient of results and verbal understanding expressed.    Patient st about a week after starting Losartan, he got a persistent cough. He was at the [place] yesterday and was told Losartan could be the cause. Patient requesting Dr. Theodosia Blender recommendations.

## 2014-10-13 NOTE — Telephone Encounter (Signed)
Stop losartan and see if cough resolves

## 2014-10-15 NOTE — Telephone Encounter (Signed)
Instructed patient to STOP LOSARTAN and to check his BP daily for a week and call with results. Patient agrees with treatment plan.

## 2014-10-31 DIAGNOSIS — J069 Acute upper respiratory infection, unspecified: Secondary | ICD-10-CM | POA: Diagnosis not present

## 2014-10-31 DIAGNOSIS — J019 Acute sinusitis, unspecified: Secondary | ICD-10-CM | POA: Diagnosis not present

## 2014-11-11 ENCOUNTER — Encounter: Payer: Self-pay | Admitting: Cardiology

## 2014-12-02 DIAGNOSIS — C4492 Squamous cell carcinoma of skin, unspecified: Secondary | ICD-10-CM | POA: Diagnosis not present

## 2014-12-02 DIAGNOSIS — L820 Inflamed seborrheic keratosis: Secondary | ICD-10-CM | POA: Diagnosis not present

## 2014-12-02 DIAGNOSIS — L821 Other seborrheic keratosis: Secondary | ICD-10-CM | POA: Diagnosis not present

## 2014-12-02 DIAGNOSIS — L570 Actinic keratosis: Secondary | ICD-10-CM | POA: Diagnosis not present

## 2014-12-04 DIAGNOSIS — Z96651 Presence of right artificial knee joint: Secondary | ICD-10-CM | POA: Diagnosis not present

## 2014-12-04 DIAGNOSIS — Z471 Aftercare following joint replacement surgery: Secondary | ICD-10-CM | POA: Diagnosis not present

## 2014-12-04 DIAGNOSIS — M5442 Lumbago with sciatica, left side: Secondary | ICD-10-CM | POA: Diagnosis not present

## 2014-12-14 DIAGNOSIS — I2510 Atherosclerotic heart disease of native coronary artery without angina pectoris: Secondary | ICD-10-CM | POA: Diagnosis not present

## 2014-12-14 DIAGNOSIS — E780 Pure hypercholesterolemia: Secondary | ICD-10-CM | POA: Diagnosis not present

## 2014-12-14 DIAGNOSIS — F39 Unspecified mood [affective] disorder: Secondary | ICD-10-CM | POA: Diagnosis not present

## 2014-12-14 DIAGNOSIS — N182 Chronic kidney disease, stage 2 (mild): Secondary | ICD-10-CM | POA: Diagnosis not present

## 2014-12-14 DIAGNOSIS — E1121 Type 2 diabetes mellitus with diabetic nephropathy: Secondary | ICD-10-CM | POA: Diagnosis not present

## 2014-12-14 DIAGNOSIS — I119 Hypertensive heart disease without heart failure: Secondary | ICD-10-CM | POA: Diagnosis not present

## 2014-12-30 DIAGNOSIS — S90411A Abrasion, right great toe, initial encounter: Secondary | ICD-10-CM | POA: Diagnosis not present

## 2014-12-30 DIAGNOSIS — S81802A Unspecified open wound, left lower leg, initial encounter: Secondary | ICD-10-CM | POA: Diagnosis not present

## 2015-01-08 DIAGNOSIS — M5442 Lumbago with sciatica, left side: Secondary | ICD-10-CM | POA: Diagnosis not present

## 2015-01-08 DIAGNOSIS — Z96651 Presence of right artificial knee joint: Secondary | ICD-10-CM | POA: Diagnosis not present

## 2015-01-08 DIAGNOSIS — Z471 Aftercare following joint replacement surgery: Secondary | ICD-10-CM | POA: Diagnosis not present

## 2015-01-11 DIAGNOSIS — N183 Chronic kidney disease, stage 3 (moderate): Secondary | ICD-10-CM | POA: Diagnosis not present

## 2015-01-11 DIAGNOSIS — E875 Hyperkalemia: Secondary | ICD-10-CM | POA: Diagnosis not present

## 2015-01-11 DIAGNOSIS — I119 Hypertensive heart disease without heart failure: Secondary | ICD-10-CM | POA: Diagnosis not present

## 2015-02-15 DIAGNOSIS — G8929 Other chronic pain: Secondary | ICD-10-CM | POA: Diagnosis not present

## 2015-02-15 DIAGNOSIS — M5442 Lumbago with sciatica, left side: Secondary | ICD-10-CM | POA: Diagnosis not present

## 2015-02-18 DIAGNOSIS — G8929 Other chronic pain: Secondary | ICD-10-CM | POA: Diagnosis not present

## 2015-02-18 DIAGNOSIS — M5442 Lumbago with sciatica, left side: Secondary | ICD-10-CM | POA: Diagnosis not present

## 2015-02-24 DIAGNOSIS — G8929 Other chronic pain: Secondary | ICD-10-CM | POA: Diagnosis not present

## 2015-02-24 DIAGNOSIS — M5442 Lumbago with sciatica, left side: Secondary | ICD-10-CM | POA: Diagnosis not present

## 2015-03-03 ENCOUNTER — Ambulatory Visit: Payer: Medicare Other | Admitting: Cardiology

## 2015-03-04 ENCOUNTER — Ambulatory Visit: Payer: Medicare Other | Admitting: Cardiology

## 2015-03-05 ENCOUNTER — Encounter: Payer: Self-pay | Admitting: Cardiology

## 2015-03-05 ENCOUNTER — Ambulatory Visit (INDEPENDENT_AMBULATORY_CARE_PROVIDER_SITE_OTHER): Payer: Medicare Other | Admitting: Cardiology

## 2015-03-05 VITALS — BP 128/72 | HR 50 | Ht 70.0 in | Wt 234.2 lb

## 2015-03-05 DIAGNOSIS — E7800 Pure hypercholesterolemia, unspecified: Secondary | ICD-10-CM

## 2015-03-05 DIAGNOSIS — I10 Essential (primary) hypertension: Secondary | ICD-10-CM

## 2015-03-05 DIAGNOSIS — I420 Dilated cardiomyopathy: Secondary | ICD-10-CM

## 2015-03-05 DIAGNOSIS — I2510 Atherosclerotic heart disease of native coronary artery without angina pectoris: Secondary | ICD-10-CM

## 2015-03-05 NOTE — Progress Notes (Signed)
Cardiology Office Note   Date:  03/05/2015   ID:  Seth Washington, DOB [birth year], MRN [id number]  PCP:  Osborne Casco, MD    Chief Complaint  Patient presents with  . Atherosclerosis      History of Present Illness: Seth Washington is a 64 y.o. male with a history of ASCAD, dyslipidemia, obesity and HTN who presents today for followup. He is doing well. He denies any chest pain,LE edema, palpitations or syncope. He has not been getting any aerobic exercise and has gained weight from chronic back pain.  He has noticed some DOE when exercising recently he thinks due to weight gain.  He has had some problems with restless legs.       Past Medical History  Diagnosis Date  . Mental disorder     PTSD  . Hypertension   . Sleep apnea     uses cpap nightly  . Hyperlipidemia   . BPH (benign prostatic hyperplasia)   . Myocardial infarction     stent placed-2009 adn 2006 hx of mi x 3   . GERD (gastroesophageal reflux disease)   . Complication of anesthesia     HEART ATTACK IN THE RECOVERY ROOM AFTER NECK SURGERY  . Diabetes mellitus     type 2 on pills   . Anxiety   . OSA (obstructive sleep apnea)   . Glaucoma   . PUD (peptic ulcer disease)   . PTSD (post-traumatic stress disorder)   . Obesity   . Aortic valve sclerosis     Mild no stenosis  . Coronary artery disease     PCI LAD 10/03, NSTEMI with PCI PDA 2/09, PCI of left circ 5/09, STEMI in setting of post op from spine surgery with nonobstructive thrombus in the diagonal and 70% LAD secondary to spasm with no PCI due to spinal surgery  . History of kidney stones   . Pain     LEFT SHOULDER PAIN - TORN ROTATOR CUFF - PT STATES SURGERY PLANNED IN OCT 2015  . Pain     RIGHT KNEE PAIN - S/P RIGHT UNI KNEE 06-10-12  . Arthritis     generalized   DDD    Past Surgical History  Procedure Laterality Date  . Cardiac catheterization      4/11 ,PTCA w/stent '03,'09  . Cervical discectomy  4/11   FUSION  . Arthroscopy  rt shoulder-2007  . Knee arthroscopy  1970's  . Coronary angioplasty      stents in 2006 and 2009   . Right shoulder subacromial decompression  05/23/11    . Knee arthroscopy with lateral menisectomy  06/10/2012    Procedure: KNEE ARTHROSCOPY WITH LATERAL MENISECTOMY;  Surgeon: [name], MD;  Location: [facility];  Service: Orthopedics;  Laterality: Right;  . Partial knee arthroplasty  06/10/2012    Procedure: UNICOMPARTMENTAL KNEE;  Surgeon: [name], MD;  Location: [facility];  Service: Orthopedics;  Laterality: Right;  Right Medial Uni Knee  . Cystoscopy with retrograde pyelogram, ureteroscopy and stent placement Right 08/13/2013    Procedure: CYSTOSCOPY WITH RETROGRADE PYELOGRAM, URETEROSCOPY AND STENT PLACEMENT;  Surgeon: [name], MD;  Location: [facility];  Service: Urology;  Laterality: Right;  . Holmium laser application Right 0/63/0160    Procedure: HOLMIUM LASER APPLICATION;  Surgeon: [name], MD;  Location: [facility];  Service: Urology;  Laterality: Right;  .  Total knee revision with scar debridement/patella revision with poly exchange Right 02/16/2014    Procedure: RIGHT KNEE INCISIONAL DEBRIDEMENT(SCAR) AND POLY REVISION;  Surgeon: [name], MD;  Location: [facility];  Service: Orthopedics;  Laterality: Right;     Current Outpatient Prescriptions  Medication Sig Dispense Refill  . aspirin 81 MG tablet Take 81 mg by mouth at bedtime. Instructed to stay on per [name],and Dr [name]    . atorvastatin (LIPITOR) 80 MG tablet Take 1 tablet (80 mg total) by mouth at bedtime. 90 tablet 3  . clopidogrel (PLAVIX) 75 MG tablet Take 75 mg by mouth at bedtime.     . cyclobenzaprine (FLEXERIL) 10 MG tablet Take 1 tablet (10 mg total) by mouth 3 (three) times daily as needed for muscle spasms. 30 tablet 0  . docusate sodium 100 MG CAPS Take 100 mg by mouth 2 (two) times daily. 10 capsule 0  . ezetimibe (ZETIA) 10 MG tablet Take 10 mg by mouth  at bedtime.     . ferrous sulfate 325 (65 FE) MG tablet Take 1 tablet (325 mg total) by mouth 2 (two) times daily with a meal.  3  . fluticasone (FLONASE) 50 MCG/ACT nasal spray Place 1 spray into both nostrils daily as needed for allergies.     . hyoscyamine (LEVSIN, ANASPAZ) 0.125 MG tablet Take 1 tablet (0.125 mg total) by mouth every 4 (four) hours as needed (bladder spasms). 40 tablet 4  . metoprolol (LOPRESSOR) 50 MG tablet Take 50 mg by mouth at bedtime.     . nitroGLYCERIN (NITROSTAT) 0.4 MG SL tablet Place 0.4 mg under the tongue every 5 (five) minutes as needed for chest pain.    Marland Kitchen omeprazole (PRILOSEC) 20 MG capsule Take 20 mg by mouth at bedtime.     Marland Kitchen oxyCODONE (OXY IR/ROXICODONE) 5 MG immediate release tablet Take 1-3 tablets (5-15 mg total) by mouth every 4 (four) hours as needed for severe pain. 120 tablet 0  . polyethylene glycol (MIRALAX / GLYCOLAX) packet Take 17 g by mouth 2 (two) times daily. 14 each 0  . QUEtiapine (SEROQUEL) 25 MG tablet Take 25 mg by mouth at bedtime.     . saxagliptin HCl (ONGLYZA) 5 MG TABS tablet Take 5 mg by mouth at bedtime.     . sertraline (ZOLOFT) 50 MG tablet Take 100 mg by mouth at bedtime.     . traZODone (DESYREL) 150 MG tablet Take 150 mg by mouth at bedtime.      No current facility-administered medications for this visit.    Allergies:   Ace inhibitors; Metformin and related; and Niacin and related    Social History:  The patient  reports that he quit smoking about 7 years ago. He has never used smokeless tobacco. He reports that he does not drink alcohol or use illicit drugs.   Family History:  The patient's family history includes Diabetes in his father; Hypertension in his father.    ROS:  Please see the history of present illness.   Otherwise, review of systems are positive for none.   All other systems are reviewed and negative.    PHYSICAL EXAM: VS:  BP 128/72 mmHg  Pulse 50  Ht 5\' 10"  (1.778 m)  Wt 234 lb 3.2 oz (106.232  kg)  BMI 33.60 kg/m2 , BMI Body mass index is 33.6 kg/(m^2). GEN: Well nourished, well developed, in no acute distress HEENT: normal Neck: no JVD, carotid bruits, or masses Cardiac: RRR; no  murmurs, rubs, or gallops,no edema  Respiratory:  clear to auscultation bilaterally, normal work of breathing GI: soft, nontender, nondistended, + BS MS: no deformity or atrophy Skin: warm and dry, no rash Neuro:  Strength and sensation are intact Psych: euthymic mood, full affect   EKG:  EKG was ordered today and showed sinus bradycardia at 50bpm with nonspecific T wave abnormality and inferior infarct    Recent Labs: 08/19/2014: BUN 14; Creatinine, Ser 1.05; Potassium 4.8; Sodium 135 10/12/2014: ALT 32    Lipid Panel    Component Value Date/Time   CHOL 110 10/12/2014 1035   TRIG 93.0 10/12/2014 1035   HDL 35.10* 10/12/2014 1035   CHOLHDL 3 10/12/2014 1035   VLDL 18.6 10/12/2014 1035   LDLCALC 56 10/12/2014 1035   LDLDIRECT 147.0 08/19/2014 1017      Wt Readings from Last 3 Encounters:  03/05/15 234 lb 3.2 oz (106.232 kg)  08/10/14 232 lb 6.4 oz (105.416 kg)  02/16/14 225 lb 8 oz (102.286 kg)    ASSESSMENT and PLAN:  1. ASCAD with no angina - continue ASA/Plavix  2. HTN - well controlled - continue metoprolol 3. Dyslipidemia - LDL 56 on lipid check 5/16 - continue Atorvastatin/Zetia  4. Obesity - I have encouraged him to get back into a routine exercise program as his back will tolerate  5. Mild LV dysfunction EF 40-45% by echo with minimal ischemia on last stress myoview 2015. He has no anginal symptoms. He appears euvolemic on exam.     Current medicines are reviewed at length with the patient today.  The patient does not have concerns regarding medicines.  The following changes have been made:  no change  Labs/ tests ordered today: See above Assessment and Plan No orders of the defined types were placed in this encounter.     Disposition:   FU with me in  6 months  Signed, Sueanne Margarita, MD  03/05/2015 2:03 PM    [facility] [place], [place], [place]  [address] Phone: [phone]; Fax: [phone]

## 2015-03-05 NOTE — Patient Instructions (Signed)

## 2015-03-10 DIAGNOSIS — M5442 Lumbago with sciatica, left side: Secondary | ICD-10-CM | POA: Diagnosis not present

## 2015-03-10 DIAGNOSIS — G8929 Other chronic pain: Secondary | ICD-10-CM | POA: Diagnosis not present

## 2015-03-16 ENCOUNTER — Telehealth: Payer: Self-pay | Admitting: *Deleted

## 2015-03-16 DIAGNOSIS — G4733 Obstructive sleep apnea (adult) (pediatric): Secondary | ICD-10-CM

## 2015-03-16 NOTE — Telephone Encounter (Signed)
OK to order new [employer]Med CPAP machine

## 2015-03-16 NOTE — Telephone Encounter (Signed)
Patient is requesting a new machine. He just left [facility] and his machine is not working, so they told him that he was eligible for a new machine.   Will forward to Dr. Radford Pax for approval/RX for a new machine.

## 2015-03-16 NOTE — Addendum Note (Signed)
Addended by: Andres Ege on: 03/16/2015 02:41 PM   Modules accepted: Orders

## 2015-03-16 NOTE — Telephone Encounter (Signed)
Patient is aware.   [place] Notified

## 2015-03-24 DIAGNOSIS — M5442 Lumbago with sciatica, left side: Secondary | ICD-10-CM | POA: Diagnosis not present

## 2015-04-23 ENCOUNTER — Telehealth: Payer: Self-pay | Admitting: Cardiology

## 2015-04-23 DIAGNOSIS — G4733 Obstructive sleep apnea (adult) (pediatric): Secondary | ICD-10-CM

## 2015-04-23 NOTE — Telephone Encounter (Signed)
New Message  Pt called states states that he would like to schedule a sleep study. This is to get a new CPAP machine. There was an order to get one but the place he orders from says that he will need a new sleep study.

## 2015-04-23 NOTE — Telephone Encounter (Signed)
Patient will be scheduled for a sleep study and set up with a new cpap from [facility]. Patient is aware to be on the lookout for a packet of information in the mail for the sleep center.

## 2015-05-20 DIAGNOSIS — G4733 Obstructive sleep apnea (adult) (pediatric): Secondary | ICD-10-CM | POA: Diagnosis not present

## 2015-05-20 DIAGNOSIS — E1169 Type 2 diabetes mellitus with other specified complication: Secondary | ICD-10-CM | POA: Diagnosis not present

## 2015-05-20 DIAGNOSIS — E7800 Pure hypercholesterolemia, unspecified: Secondary | ICD-10-CM | POA: Diagnosis not present

## 2015-05-20 DIAGNOSIS — E291 Testicular hypofunction: Secondary | ICD-10-CM | POA: Diagnosis not present

## 2015-05-20 DIAGNOSIS — F39 Unspecified mood [affective] disorder: Secondary | ICD-10-CM | POA: Diagnosis not present

## 2015-05-20 DIAGNOSIS — N182 Chronic kidney disease, stage 2 (mild): Secondary | ICD-10-CM | POA: Diagnosis not present

## 2015-05-20 DIAGNOSIS — I119 Hypertensive heart disease without heart failure: Secondary | ICD-10-CM | POA: Diagnosis not present

## 2015-05-20 DIAGNOSIS — E1121 Type 2 diabetes mellitus with diabetic nephropathy: Secondary | ICD-10-CM | POA: Diagnosis not present

## 2015-05-20 DIAGNOSIS — I2510 Atherosclerotic heart disease of native coronary artery without angina pectoris: Secondary | ICD-10-CM | POA: Diagnosis not present

## 2015-05-20 DIAGNOSIS — Z0000 Encounter for general adult medical examination without abnormal findings: Secondary | ICD-10-CM | POA: Diagnosis not present

## 2015-05-20 DIAGNOSIS — E875 Hyperkalemia: Secondary | ICD-10-CM | POA: Diagnosis not present

## 2015-05-20 DIAGNOSIS — I129 Hypertensive chronic kidney disease with stage 1 through stage 4 chronic kidney disease, or unspecified chronic kidney disease: Secondary | ICD-10-CM | POA: Diagnosis not present

## 2015-06-02 DIAGNOSIS — L570 Actinic keratosis: Secondary | ICD-10-CM | POA: Diagnosis not present

## 2015-06-02 DIAGNOSIS — L821 Other seborrheic keratosis: Secondary | ICD-10-CM | POA: Diagnosis not present

## 2015-06-02 DIAGNOSIS — Z86018 Personal history of other benign neoplasm: Secondary | ICD-10-CM | POA: Diagnosis not present

## 2015-06-02 DIAGNOSIS — Z808 Family history of malignant neoplasm of other organs or systems: Secondary | ICD-10-CM | POA: Diagnosis not present

## 2015-06-02 DIAGNOSIS — D225 Melanocytic nevi of trunk: Secondary | ICD-10-CM | POA: Diagnosis not present

## 2015-06-02 DIAGNOSIS — Z85828 Personal history of other malignant neoplasm of skin: Secondary | ICD-10-CM | POA: Diagnosis not present

## 2015-06-29 ENCOUNTER — Ambulatory Visit (HOSPITAL_BASED_OUTPATIENT_CLINIC_OR_DEPARTMENT_OTHER): Payer: Medicare Other | Attending: Cardiology

## 2015-06-29 VITALS — Ht 70.0 in | Wt 230.0 lb

## 2015-06-29 DIAGNOSIS — I493 Ventricular premature depolarization: Secondary | ICD-10-CM | POA: Diagnosis not present

## 2015-06-29 DIAGNOSIS — Z7902 Long term (current) use of antithrombotics/antiplatelets: Secondary | ICD-10-CM | POA: Diagnosis not present

## 2015-06-29 DIAGNOSIS — G4733 Obstructive sleep apnea (adult) (pediatric): Secondary | ICD-10-CM

## 2015-06-29 DIAGNOSIS — M25511 Pain in right shoulder: Secondary | ICD-10-CM | POA: Diagnosis not present

## 2015-06-29 DIAGNOSIS — Z79899 Other long term (current) drug therapy: Secondary | ICD-10-CM | POA: Insufficient documentation

## 2015-06-29 DIAGNOSIS — R0683 Snoring: Secondary | ICD-10-CM | POA: Diagnosis not present

## 2015-07-06 ENCOUNTER — Telehealth: Payer: Self-pay | Admitting: Cardiology

## 2015-07-06 NOTE — Telephone Encounter (Signed)
Please let patient know that sleep study showed no significant sleep apnea.    

## 2015-07-06 NOTE — Sleep Study (Signed)
   Patient Name: Seth Washington, Seth Washington MRN: [id number] Study Date: 06/29/2015 Gender: Male D.O.B: 1951/02/13 Age (years): 87 Referring Provider: Fransico Him MD, ABSM Interpreting Physician: Fransico Him MD, ABSM RPSGT: Madelon Lips  Weight (lbs): 230 Height (inches): 70 BMI: 33 Neck Size: 18.00  CLINICAL INFORMATION Sleep Study Type: NPSG Indication for sleep study: N/A Epworth Sleepiness Score: 11  SLEEP STUDY TECHNIQUE As per the AASM Manual for the Scoring of Sleep and Associated Events v2.3 (April 2016) with a hypopnea requiring 4% desaturations. The channels recorded and monitored were frontal, central and occipital EEG, electrooculogram (EOG), submentalis EMG (chin), nasal and oral airflow, thoracic and abdominal wall motion, anterior tibialis EMG, snore microphone, electrocardiogram, and pulse oximetry.  MEDICATIONS PPatient's medications include: ASA, Atorvastatin, Plavix, Flexeril, Colace, Zetia, Iron sulfate, Flonase, Levsin, Lopressor, Prilosec, Oxycodone, Miralax, Seroquel, Onglyza, Zoloft, Trazadone. Medications self-administered by patient during sleep study : No sleep medicine administered.  SLEEP ARCHITECTURE The study was initiated at 9:58:23 PM and ended at 4:20:34 AM. Sleep onset time was 9.6 minutes and the sleep efficiency was 92.1%. The total sleep time was 352.0 minutes. Stage REM latency was 89.0 minutes. The patient spent 13.36% of the night in stage N1 sleep, 70.59% in stage N2 sleep, 0.14% in stage N3 and 15.91% in REM. Alpha intrusion was absent. Supine sleep was 0.00%.  RESPIRATORY PARAMETERS The overall apnea/hypopnea index (AHI) was 4.3 per hour. There were 14 total apneas, including 0 obstructive, 14 central and 0 mixed apneas. There were 11 hypopneas and 19 RERAs. The AHI during Stage REM sleep was 1.1 per hour. AHI while supine was N/A per hour. The mean oxygen saturation was 91.91%. The minimum SpO2 during sleep was 87.00%. Moderate snoring was  noted during this study.  CARDIAC DATA The 2 lead EKG demonstrated sinus rhythm. The mean heart rate was 51.41 beats per minute. Other EKG findings include: PVCs.  LEG MOVEMENT DATA The total PLMS were 0 with a resulting PLMS index of 0.00. Associated arousal with leg movement index was 0.0 .  IMPRESSIONS - No significant obstructive sleep apnea occurred during this study (AHI = 4.3/h). - No significant central sleep apnea occurred during this study (CAI = 2.4/h). - Mild oxygen desaturation was noted during this study (Min O2 = 87.00%). - The patient snored with Moderate snoring volume. - EKG findings include PVCs. - Clinically significant periodic limb movements did not occur during sleep. No significant associated arousals.  DIAGNOSIS - snoring - OSA  RECOMMENDATIONS - Avoid alcohol, sedatives and other CNS depressants that may worsen sleep apnea and disrupt normal sleep architecture. - Sleep hygiene should be reviewed to assess factors that may improve sleep quality. - Weight management and regular exercise should be initiated or continued if appropriate.  [place], American Board of Sleep Medicine  ELECTRONICALLY SIGNED ON:  07/06/2015, 8:11 PM [place] PH: [phone]   FX: [phone] [place]

## 2015-07-07 ENCOUNTER — Ambulatory Visit (HOSPITAL_BASED_OUTPATIENT_CLINIC_OR_DEPARTMENT_OTHER): Payer: Medicare Other

## 2015-07-08 NOTE — Telephone Encounter (Signed)
He apparently has no sleep apnea,  Please confirm that study was done of CPAP with sleep lab but appeared to be per the reports sent to me

## 2015-07-08 NOTE — Telephone Encounter (Signed)
Patient is already on CPAP.  This study was done to get a new machine.

## 2015-07-09 NOTE — Telephone Encounter (Signed)
Waiting on Vernon from the sleep lab to call me on Monday 07/12/15 to discuss

## 2015-07-12 NOTE — Telephone Encounter (Signed)
Vernon from the sleep lab stated that the only reason he might could think that this text was negative was that the patient did not sleep on his back.  This study was done off cpap

## 2015-07-12 NOTE — Telephone Encounter (Signed)
I would recommend a home sleep study since patient may sleep on his back at home.  If this is normal he no longer needs CPAP

## 2015-07-12 NOTE — Telephone Encounter (Signed)
Patient is aware of the results from the sleep lab.  He would like to do the Home Sleep Study just to make sure everything is cleared.  I will submit the information for [facility].

## 2015-07-12 NOTE — Telephone Encounter (Signed)
Left message for patient to call me back to discuss.

## 2015-07-14 DIAGNOSIS — G8929 Other chronic pain: Secondary | ICD-10-CM | POA: Diagnosis not present

## 2015-07-14 DIAGNOSIS — M25511 Pain in right shoulder: Secondary | ICD-10-CM | POA: Diagnosis not present

## 2015-07-19 DIAGNOSIS — G4733 Obstructive sleep apnea (adult) (pediatric): Secondary | ICD-10-CM | POA: Diagnosis not present

## 2015-07-20 DIAGNOSIS — G8929 Other chronic pain: Secondary | ICD-10-CM | POA: Diagnosis not present

## 2015-07-20 DIAGNOSIS — G4733 Obstructive sleep apnea (adult) (pediatric): Secondary | ICD-10-CM | POA: Diagnosis not present

## 2015-07-20 DIAGNOSIS — M25511 Pain in right shoulder: Secondary | ICD-10-CM | POA: Diagnosis not present

## 2015-07-21 DIAGNOSIS — G4733 Obstructive sleep apnea (adult) (pediatric): Secondary | ICD-10-CM | POA: Diagnosis not present

## 2015-07-22 DIAGNOSIS — M25511 Pain in right shoulder: Secondary | ICD-10-CM | POA: Diagnosis not present

## 2015-07-22 DIAGNOSIS — G8929 Other chronic pain: Secondary | ICD-10-CM | POA: Diagnosis not present

## 2015-08-02 DIAGNOSIS — M25511 Pain in right shoulder: Secondary | ICD-10-CM | POA: Diagnosis not present

## 2015-08-02 DIAGNOSIS — M25611 Stiffness of right shoulder, not elsewhere classified: Secondary | ICD-10-CM | POA: Diagnosis not present

## 2015-08-02 DIAGNOSIS — M6281 Muscle weakness (generalized): Secondary | ICD-10-CM | POA: Diagnosis not present

## 2015-08-02 DIAGNOSIS — G8929 Other chronic pain: Secondary | ICD-10-CM | POA: Diagnosis not present

## 2015-08-12 DIAGNOSIS — G8929 Other chronic pain: Secondary | ICD-10-CM | POA: Diagnosis not present

## 2015-08-12 DIAGNOSIS — M25511 Pain in right shoulder: Secondary | ICD-10-CM | POA: Diagnosis not present

## 2015-08-12 DIAGNOSIS — M25611 Stiffness of right shoulder, not elsewhere classified: Secondary | ICD-10-CM | POA: Diagnosis not present

## 2015-08-12 DIAGNOSIS — M6281 Muscle weakness (generalized): Secondary | ICD-10-CM | POA: Diagnosis not present

## 2015-08-25 ENCOUNTER — Telehealth: Payer: Self-pay

## 2015-08-25 DIAGNOSIS — M12811 Other specific arthropathies, not elsewhere classified, right shoulder: Secondary | ICD-10-CM | POA: Diagnosis not present

## 2015-08-25 NOTE — Telephone Encounter (Signed)
Received call from the front desk that a patient is here for his 2nd sleep study results (home sleep study). The front desk informed patient Seth Washington will call him tomorrow with an update.

## 2015-08-26 ENCOUNTER — Telehealth: Payer: Self-pay | Admitting: Cardiology

## 2015-08-26 NOTE — Telephone Encounter (Signed)
Spoke with patient regarding sleep study.   Seth Washington gave the option to stay on CPAP or be referred for oral device.   Patient requests to stay on CPAP.  I will call [facility] to see if we have to get a titration in the lab or if we can do an Auto machine for titration.  Patient is aware that I will call him when I have the answer to that question.

## 2015-08-26 NOTE — Telephone Encounter (Signed)
Walk in pt form-Pt Needs call about Sleep Studies-gave to [facility] K

## 2015-09-09 ENCOUNTER — Encounter: Payer: Self-pay | Admitting: Cardiology

## 2015-09-13 ENCOUNTER — Telehealth: Payer: Self-pay

## 2015-09-13 DIAGNOSIS — N183 Chronic kidney disease, stage 3 (moderate): Secondary | ICD-10-CM | POA: Diagnosis not present

## 2015-09-13 DIAGNOSIS — R55 Syncope and collapse: Secondary | ICD-10-CM | POA: Diagnosis not present

## 2015-09-13 DIAGNOSIS — E1169 Type 2 diabetes mellitus with other specified complication: Secondary | ICD-10-CM | POA: Diagnosis not present

## 2015-09-13 DIAGNOSIS — Z794 Long term (current) use of insulin: Secondary | ICD-10-CM | POA: Diagnosis not present

## 2015-09-13 DIAGNOSIS — G4733 Obstructive sleep apnea (adult) (pediatric): Secondary | ICD-10-CM

## 2015-09-13 DIAGNOSIS — I119 Hypertensive heart disease without heart failure: Secondary | ICD-10-CM | POA: Diagnosis not present

## 2015-09-13 DIAGNOSIS — I2510 Atherosclerotic heart disease of native coronary artery without angina pectoris: Secondary | ICD-10-CM | POA: Diagnosis not present

## 2015-09-13 DIAGNOSIS — Z7984 Long term (current) use of oral hypoglycemic drugs: Secondary | ICD-10-CM | POA: Diagnosis not present

## 2015-09-13 NOTE — Telephone Encounter (Signed)
Orders sent to [facility]. Patient aware.

## 2015-09-13 NOTE — Telephone Encounter (Signed)
-----   Message from Sueanne Margarita, MD sent at 09/13/2015  3:20 PM EDT ----- 2 week CPAP autotitration from 4 to 18cm H2O

## 2015-09-14 ENCOUNTER — Other Ambulatory Visit: Payer: Self-pay | Admitting: *Deleted

## 2015-09-14 DIAGNOSIS — E1169 Type 2 diabetes mellitus with other specified complication: Secondary | ICD-10-CM | POA: Diagnosis not present

## 2015-09-14 DIAGNOSIS — G4733 Obstructive sleep apnea (adult) (pediatric): Secondary | ICD-10-CM

## 2015-09-14 DIAGNOSIS — Z794 Long term (current) use of insulin: Secondary | ICD-10-CM | POA: Diagnosis not present

## 2015-09-14 DIAGNOSIS — R55 Syncope and collapse: Secondary | ICD-10-CM | POA: Diagnosis not present

## 2015-09-14 DIAGNOSIS — Z7984 Long term (current) use of oral hypoglycemic drugs: Secondary | ICD-10-CM | POA: Diagnosis not present

## 2015-09-23 ENCOUNTER — Ambulatory Visit (INDEPENDENT_AMBULATORY_CARE_PROVIDER_SITE_OTHER): Payer: Medicare Other | Admitting: Cardiology

## 2015-09-23 ENCOUNTER — Encounter: Payer: Self-pay | Admitting: Cardiology

## 2015-09-23 VITALS — BP 146/90 | HR 67 | Ht 70.0 in | Wt 225.8 lb

## 2015-09-23 DIAGNOSIS — I2510 Atherosclerotic heart disease of native coronary artery without angina pectoris: Secondary | ICD-10-CM

## 2015-09-23 DIAGNOSIS — I10 Essential (primary) hypertension: Secondary | ICD-10-CM | POA: Diagnosis not present

## 2015-09-23 DIAGNOSIS — E669 Obesity, unspecified: Secondary | ICD-10-CM

## 2015-09-23 DIAGNOSIS — I420 Dilated cardiomyopathy: Secondary | ICD-10-CM

## 2015-09-23 DIAGNOSIS — G4733 Obstructive sleep apnea (adult) (pediatric): Secondary | ICD-10-CM

## 2015-09-23 DIAGNOSIS — R001 Bradycardia, unspecified: Secondary | ICD-10-CM

## 2015-09-23 DIAGNOSIS — E7800 Pure hypercholesterolemia, unspecified: Secondary | ICD-10-CM

## 2015-09-23 HISTORY — DX: Bradycardia, unspecified: R00.1

## 2015-09-23 LAB — BASIC METABOLIC PANEL
BUN: 15 mg/dL (ref 7–25)
CHLORIDE: 100 mmol/L (ref 98–110)
CO2: 26 mmol/L (ref 20–31)
CREATININE: 0.95 mg/dL (ref 0.70–1.25)
Calcium: 9 mg/dL (ref 8.6–10.3)
Glucose, Bld: 231 mg/dL — ABNORMAL HIGH (ref 65–99)
POTASSIUM: 4.4 mmol/L (ref 3.5–5.3)
Sodium: 135 mmol/L (ref 135–146)

## 2015-09-23 MED ORDER — LOSARTAN POTASSIUM 50 MG PO TABS: 50.0000 mg | ORAL_TABLET | Freq: Every day | ORAL | Status: DC

## 2015-09-23 NOTE — Patient Instructions (Addendum)
Medication Instructions:  Your physician has recommended you make the following change in your medication:  1) INCREASE LOSARTAN to 50 mg daily  Labwork: TODAY: BMET  IN TWO WEEKS: BMET  Testing/Procedures: Your physician has recommended that you wear an event monitor. Event monitors are medical devices that record the heart's electrical activity. Doctors most often [place] these monitors to diagnose arrhythmias. Arrhythmias are problems with the speed or rhythm of the heartbeat. The monitor is a small, portable device. You can wear one while you do your normal daily activities. This is usually used to diagnose what is causing palpitations/syncope (passing out).  Follow-Up: Your physician recommends that you schedule a follow-up appointment in: 2 weeks with Gay Filler, [facility] in the HTN Clinic.  Your physician wants you to follow-up in: 6 months with Dr. Radford Pax. You will receive a reminder letter in the mail two months in advance. If you don't receive a letter, please call our office to schedule the follow-up appointment.   Any Other Special Instructions Will Be Listed Below (If Applicable).     If you need a refill on your cardiac medications before your next appointment, please call your pharmacy.

## 2015-09-23 NOTE — Progress Notes (Signed)
Cardiology Office Note    Date:  09/23/2015   ID:  Seth Washington, DOB 26-Mar-1951, MRN [id number]  PCP:  Osborne Casco, MD  Cardiologist:  Sueanne Margarita, MD   No chief complaint on file.   History of Present Illness:  Seth Washington is a 65 y.o. male with a history of ASCAD, dyslipidemia, obesity and HTN who presents today for followup. He is doing well. He denies any chest pain, SOB, PDN, orthopnea, LE edema, palpitations or syncope. He has been walking some with his wife.   His exercise is limited by his arthritic pain.   He was recently seen by his PCP for lightheadedness when going from sitting to standing and BB dose was cut back.  On  Last OV he was bradycardic with HR 50-60bpm.  He says that the dizziness is not all the time.  He was in [place] a few weeks ago and while standing felt a little woozy but only lasted a second and resolved.  He has had a few episodes at the house of feeling off balance and walking into the wall.      Past Medical History  Diagnosis Date  . Mental disorder     PTSD  . Hypertension   . Hyperlipidemia   . BPH (benign prostatic hyperplasia)   . GERD (gastroesophageal reflux disease)   . Diabetes mellitus     type 2 on pills   . Anxiety   . OSA (obstructive sleep apnea)   . Glaucoma   . PUD (peptic ulcer disease)   . PTSD (post-traumatic stress disorder)   . Obesity   . Aortic valve sclerosis     Mild no stenosis  . Coronary artery disease     PCI LAD 10/03, NSTEMI with PCI PDA 2/09, PCI of left circ 5/09, STEMI in setting of post op from spine surgery with nonobstructive thrombus in the diagonal and 70% LAD secondary to spasm with no PCI due to spinal surgery  . History of kidney stones   . Pain     LEFT SHOULDER PAIN - TORN ROTATOR CUFF - PT STATES SURGERY PLANNED IN OCT 2015  . Pain     RIGHT KNEE PAIN - S/P RIGHT UNI KNEE 06-10-12  . Arthritis     generalized   DDD  . Bradycardia 09/23/2015    Past Surgical History    Procedure Laterality Date  . Cardiac catheterization      4/11 ,PTCA w/stent '03,'09  . Cervical discectomy  4/11    FUSION  . Arthroscopy  rt shoulder-2007  . Knee arthroscopy  1970's  . Coronary angioplasty      stents in 2006 and 2009   . Right shoulder subacromial decompression  05/23/11    . Knee arthroscopy with lateral menisectomy  06/10/2012    Procedure: KNEE ARTHROSCOPY WITH LATERAL MENISECTOMY;  Surgeon: [name], MD;  Location: [facility];  Service: Orthopedics;  Laterality: Right;  . Partial knee arthroplasty  06/10/2012    Procedure: UNICOMPARTMENTAL KNEE;  Surgeon: [name], MD;  Location: [facility];  Service: Orthopedics;  Laterality: Right;  Right Medial Uni Knee  . Cystoscopy with retrograde pyelogram, ureteroscopy and stent placement Right 08/13/2013    Procedure: CYSTOSCOPY WITH RETROGRADE PYELOGRAM, URETEROSCOPY AND STENT PLACEMENT;  Surgeon: [name], MD;  Location: [facility];  Service: Urology;  Laterality: Right;  . Holmium laser application Right [id number]    Procedure: HOLMIUM LASER APPLICATION;  Surgeon: [name],  MD;  Location: [facility];  Service: Urology;  Laterality: Right;  . Total knee revision with scar debridement/patella revision with poly exchange Right 02/16/2014    Procedure: RIGHT KNEE INCISIONAL DEBRIDEMENT(SCAR) AND POLY REVISION;  Surgeon: [name], MD;  Location: [facility];  Service: Orthopedics;  Laterality: Right;    Current Medications: Outpatient Prescriptions Prior to Visit  Medication Sig Dispense Refill  . aspirin 81 MG tablet Take 81 mg by mouth at bedtime. Instructed to stay on per [name],and Dr [name]    . atorvastatin (LIPITOR) 80 MG tablet Take 1 tablet (80 mg total) by mouth at bedtime. 90 tablet 3  . clopidogrel (PLAVIX) 75 MG tablet Take 75 mg by mouth at bedtime.     . cyclobenzaprine (FLEXERIL) 10 MG tablet Take 1 tablet (10 mg total) by mouth 3 (three) times daily as needed for muscle spasms. 30  tablet 0  . docusate sodium 100 MG CAPS Take 100 mg by mouth 2 (two) times daily. 10 capsule 0  . ezetimibe (ZETIA) 10 MG tablet Take 10 mg by mouth at bedtime.     . ferrous sulfate 325 (65 FE) MG tablet Take 1 tablet (325 mg total) by mouth 2 (two) times daily with a meal.  3  . fluticasone (FLONASE) 50 MCG/ACT nasal spray Place 1 spray into both nostrils daily as needed for allergies.     . hyoscyamine (LEVSIN, ANASPAZ) 0.125 MG tablet Take 1 tablet (0.125 mg total) by mouth every 4 (four) hours as needed (bladder spasms). 40 tablet 4  . nitroGLYCERIN (NITROSTAT) 0.4 MG SL tablet Place 0.4 mg under the tongue every 5 (five) minutes as needed for chest pain.    Marland Kitchen omeprazole (PRILOSEC) 20 MG capsule Take 20 mg by mouth at bedtime.     Marland Kitchen oxyCODONE (OXY IR/ROXICODONE) 5 MG immediate release tablet Take 1-3 tablets (5-15 mg total) by mouth every 4 (four) hours as needed for severe pain. 120 tablet 0  . polyethylene glycol (MIRALAX / GLYCOLAX) packet Take 17 g by mouth 2 (two) times daily. 14 each 0  . QUEtiapine (SEROQUEL) 25 MG tablet Take 25 mg by mouth at bedtime.     . saxagliptin HCl (ONGLYZA) 5 MG TABS tablet Take 5 mg by mouth at bedtime.     . sertraline (ZOLOFT) 50 MG tablet Take 100 mg by mouth at bedtime.     . traZODone (DESYREL) 150 MG tablet Take 150 mg by mouth at bedtime.     . metoprolol (LOPRESSOR) 50 MG tablet Take 50 mg by mouth at bedtime. Reported on 09/23/2015     No facility-administered medications prior to visit.     Allergies:   Ace inhibitors; Metformin and related; and Niacin and related   Social History   Social History  . Marital Status: Married    Spouse Name: N/A  . Number of Children: N/A  . Years of Education: N/A   Social History Main Topics  . Smoking status: Former Smoker    Quit date: 09/13/2007  . Smokeless tobacco: Never Used  . Alcohol Use: No  . Drug Use: No  . Sexual Activity: Not Asked   Other Topics Concern  . None   Social History  Narrative     Family History:  The patient's family history includes Diabetes in his father; Hypertension in his father.   ROS:   Please see the history of present illness.    Review of Systems  Constitution: Negative.  HENT:  Negative.   Eyes: Negative.   Cardiovascular: Negative.   Respiratory: Negative.   Skin: Negative.   Musculoskeletal: Negative.   Gastrointestinal: Negative.   Genitourinary: Negative.   Neurological: Negative.   Psychiatric/Behavioral: Negative.    All other systems reviewed and are negative.   PHYSICAL EXAM:   VS:  BP 146/90 mmHg  Pulse 67  Ht 5\' 10"  (1.778 m)  Wt 225 lb 12.8 oz (102.422 kg)  BMI 32.40 kg/m2   GEN: Well nourished, well developed, in no acute distress HEENT: normal Neck: no JVD, carotid bruits, or masses Cardiac: RRR; no murmurs, rubs, or gallops,no edema.  Intact distal pulses bilaterally.  Respiratory:  clear to auscultation bilaterally, normal work of breathing GI: soft, nontender, nondistended, + BS MS: no deformity or atrophy Skin: warm and dry, no rash Neuro:  Alert and Oriented x 3, Strength and sensation are intact Psych: euthymic mood, full affect  Wt Readings from Last 3 Encounters:  09/23/15 225 lb 12.8 oz (102.422 kg)  06/29/15 230 lb (104.327 kg)  03/05/15 234 lb 3.2 oz (106.232 kg)      Studies/Labs Reviewed:   EKG:  EKG is not ordered today.   Recent Labs: 10/12/2014: ALT 32   Lipid Panel    Component Value Date/Time   CHOL 110 10/12/2014 1035   TRIG 93.0 10/12/2014 1035   HDL 35.10* 10/12/2014 1035   CHOLHDL 3 10/12/2014 1035   VLDL 18.6 10/12/2014 1035   LDLCALC 56 10/12/2014 1035   LDLDIRECT 147.0 08/19/2014 1017    Additional studies/ records that were reviewed today include:  OV records from Dr. Wonda Amis office    ASSESSMENT:    1. Atherosclerosis of native coronary artery of native heart without angina pectoris   2. Essential hypertension, benign   3. DCM (dilated cardiomyopathy) ([place])    4. Obstructive sleep apnea   5. Obese   6. Pure hypercholesterolemia   7. Bradycardia      PLAN:  In order of problems listed above:  1. ASCAD with no angina.  Continue ASA/Plaivx/statin 2. HTN - BP slightly elevated today but he has been having some problems with hypotension and dizziness recently and is now off BB.  He is not orthostatic on exam today and BP is mildly elevated.  I am going to increase Losartan to 50mg  daily and followup in HTN clinic in 2 weeks with BMET. 3. DCM - EF 40-45% by echo 07/2013.  No BB secondary to bradycardia and orthostasis.   4. OSA - tolerating his CPAP well.  He is getting a new device through the [place]. 5. Obesity have encouraged him to get into a routine exercise program and cut back on carbs and portions.  6. Hyperlipidemia - LDL goal is < 70.  Continue statin.  Last LDL 61 in Dec 2016 at PCP 7. Bradycardia - this has resolved by vital signs but he has continued to have some problems with dizzy spells.  I will get a 30 day event monitor to assess for bradyarrhythmias.      Medication Adjustments/Labs and Tests Ordered: Current medicines are reviewed at length with the patient today.  Concerns regarding medicines are outlined above.  Medication changes, Labs and Tests ordered today are listed in the Patient Instructions below. Patient Instructions  Medication Instructions:  Your physician recommends that you continue on your current medications as directed. Please refer to the Current Medication list given to you today.   Labwork: None  Testing/Procedures: Your physician has recommended that  you wear an event monitor. Event monitors are medical devices that record the heart's electrical activity. Doctors most often [place] these monitors to diagnose arrhythmias. Arrhythmias are problems with the speed or rhythm of the heartbeat. The monitor is a small, portable device. You can wear one while you do your normal daily activities. This is usually used to  diagnose what is causing palpitations/syncope (passing out).  Follow-Up: Your physician wants you to follow-up in: 6 months with Dr. Radford Pax. You will receive a reminder letter in the mail two months in advance. If you don't receive a letter, please call our office to schedule the follow-up appointment.   Any Other Special Instructions Will Be Listed Below (If Applicable).     If you need a refill on your cardiac medications before your next appointment, please call your pharmacy.       Lurena Nida, MD  09/23/2015 10:23 AM    [facility] [address], [place], [place]  [address] Phone: [phone]; Fax: [phone]

## 2015-09-23 NOTE — Addendum Note (Signed)
Addended by: Harland German A on: 09/23/2015 11:30 AM   Modules accepted: Orders

## 2015-10-05 DIAGNOSIS — R109 Unspecified abdominal pain: Secondary | ICD-10-CM | POA: Diagnosis not present

## 2015-10-06 ENCOUNTER — Encounter: Payer: Self-pay | Admitting: Pharmacist

## 2015-10-06 ENCOUNTER — Other Ambulatory Visit: Payer: Medicare Other | Admitting: *Deleted

## 2015-10-06 ENCOUNTER — Ambulatory Visit (INDEPENDENT_AMBULATORY_CARE_PROVIDER_SITE_OTHER): Payer: Medicare Other | Admitting: Pharmacist

## 2015-10-06 VITALS — BP 128/82 | HR 70

## 2015-10-06 DIAGNOSIS — I10 Essential (primary) hypertension: Secondary | ICD-10-CM | POA: Diagnosis not present

## 2015-10-06 DIAGNOSIS — I420 Dilated cardiomyopathy: Secondary | ICD-10-CM | POA: Diagnosis not present

## 2015-10-06 DIAGNOSIS — I428 Other cardiomyopathies: Secondary | ICD-10-CM | POA: Diagnosis not present

## 2015-10-06 DIAGNOSIS — L570 Actinic keratosis: Secondary | ICD-10-CM | POA: Diagnosis not present

## 2015-10-06 LAB — BASIC METABOLIC PANEL
BUN: 17 mg/dL (ref 7–25)
CO2: 24 mmol/L (ref 20–31)
CREATININE: 1 mg/dL (ref 0.70–1.25)
Calcium: 8.9 mg/dL (ref 8.6–10.3)
Chloride: 100 mmol/L (ref 98–110)
Glucose, Bld: 270 mg/dL — ABNORMAL HIGH (ref 65–99)
Potassium: 4.6 mmol/L (ref 3.5–5.3)
Sodium: 134 mmol/L — ABNORMAL LOW (ref 135–146)

## 2015-10-06 NOTE — Progress Notes (Signed)
Reviewed plan of care with resident.  Agree with above.  Pt's BP controlled with increase in losartan.  Check BMET today.

## 2015-10-06 NOTE — Addendum Note (Signed)
Addended by: Eulis Foster on: 10/06/2015 09:58 AM   Modules accepted: Orders

## 2015-10-06 NOTE — Progress Notes (Signed)
Patient ID: Seth Washington                 DOB: Jul 20, 1950                      MRN: [id number]     HPI: POLK PHONG is a 66 y.o. male referred by Dr. Radford Pax to HTN clinic. Recent increase of losartan from 25mg  daily to 50mg  daily on 4/20; tolerating well. Reports dizziness and lightheadedness has improved since last visit with Dr. Radford Pax. Also complains of difficulty controlling blood glucose and is currently self-titrating his insulin per his PCP (not listed on active medications; pt believes it is lantus). Denies any noticeable adverse effects to current medications. Does not check BP at home.   Current HTN meds: Losartan 50mg  daily  Previously tried: Amlodipine 10mg  daily, enalapril 10 daily, metoprolol 50mg  BID  BP goal: <140/90 mmHg   Diet: Likely a large contributor in his reported difficult to control blood glucose. Wife usually monitors his diet however, reports indiscretion when "unsupervised"    Exercise: Exercises at the gym fairly regularly but limited due to arthritis.    Wt Readings from Last 3 Encounters:  09/23/15 225 lb 12.8 oz (102.422 kg)  06/29/15 230 lb (104.327 kg)  03/05/15 234 lb 3.2 oz (106.232 kg)   BP Readings from Last 3 Encounters:  10/06/15 128/82  09/23/15 146/90  03/05/15 128/72   Pulse Readings from Last 3 Encounters:  10/06/15 70  09/23/15 67  03/05/15 50    Renal function: Estimated Creatinine Clearance: 95.5 mL/min (by C-G formula based on Cr of 0.95).  Past Medical History  Diagnosis Date  . Mental disorder     PTSD  . Hypertension   . Hyperlipidemia   . BPH (benign prostatic hyperplasia)   . GERD (gastroesophageal reflux disease)   . Diabetes mellitus     type 2 on pills   . Anxiety   . OSA (obstructive sleep apnea)   . Glaucoma   . PUD (peptic ulcer disease)   . PTSD (post-traumatic stress disorder)   . Obesity   . Aortic valve sclerosis     Mild no stenosis  . Coronary artery disease     PCI LAD 10/03, NSTEMI with  PCI PDA 2/09, PCI of left circ 5/09, STEMI in setting of post op from spine surgery with nonobstructive thrombus in the diagonal and 70% LAD secondary to spasm with no PCI due to spinal surgery  . History of kidney stones   . Pain     LEFT SHOULDER PAIN - TORN ROTATOR CUFF - PT STATES SURGERY PLANNED IN OCT 2015  . Pain     RIGHT KNEE PAIN - S/P RIGHT UNI KNEE 06-10-12  . Arthritis     generalized   DDD  . Bradycardia 09/23/2015    Current Outpatient Prescriptions on File Prior to Visit  Medication Sig Dispense Refill  . aspirin 81 MG tablet Take 81 mg by mouth at bedtime. Instructed to stay on per [name],and Dr [name]    . atorvastatin (LIPITOR) 80 MG tablet Take 1 tablet (80 mg total) by mouth at bedtime. 90 tablet 3  . clopidogrel (PLAVIX) 75 MG tablet Take 75 mg by mouth at bedtime.     . cyclobenzaprine (FLEXERIL) 10 MG tablet Take 1 tablet (10 mg total) by mouth 3 (three) times daily as needed for muscle spasms. 30 tablet 0  . docusate sodium 100 MG CAPS Take 100  mg by mouth 2 (two) times daily. 10 capsule 0  . ezetimibe (ZETIA) 10 MG tablet Take 10 mg by mouth at bedtime.     . ferrous sulfate 325 (65 FE) MG tablet Take 1 tablet (325 mg total) by mouth 2 (two) times daily with a meal.  3  . fluticasone (FLONASE) 50 MCG/ACT nasal spray Place 1 spray into both nostrils daily as needed for allergies.     . hyoscyamine (LEVSIN, ANASPAZ) 0.125 MG tablet Take 1 tablet (0.125 mg total) by mouth every 4 (four) hours as needed (bladder spasms). 40 tablet 4  . losartan (COZAAR) 50 MG tablet Take 1 tablet (50 mg total) by mouth daily. 90 tablet 3  . nitroGLYCERIN (NITROSTAT) 0.4 MG SL tablet Place 0.4 mg under the tongue every 5 (five) minutes as needed for chest pain.    Marland Kitchen omeprazole (PRILOSEC) 20 MG capsule Take 20 mg by mouth at bedtime.     Marland Kitchen oxyCODONE (OXY IR/ROXICODONE) 5 MG immediate release tablet Take 1-3 tablets (5-15 mg total) by mouth every 4 (four) hours as needed for severe  pain. 120 tablet 0  . polyethylene glycol (MIRALAX / GLYCOLAX) packet Take 17 g by mouth 2 (two) times daily. 14 each 0  . QUEtiapine (SEROQUEL) 25 MG tablet Take 25 mg by mouth at bedtime.     . saxagliptin HCl (ONGLYZA) 5 MG TABS tablet Take 5 mg by mouth at bedtime.     . sertraline (ZOLOFT) 50 MG tablet Take 100 mg by mouth at bedtime.     . traZODone (DESYREL) 150 MG tablet Take 150 mg by mouth at bedtime.      No current facility-administered medications on file prior to visit.    Allergies  Allergen Reactions  . Ace Inhibitors Cough  . Metformin And Related Diarrhea    Severe diarrhea  . Niacin And Related Other (See Comments)    body flushed, makes body feel like its on fire     Assessment/Plan:  1. Hypertension: BP controlled in clinic today at 128/72 with HR of 70. BMET today considering recent increase of losartan. Discussed how dietary indiscretion will continue to make BP and blood glucose control difficult at length with patient. Will defer any medication changes at this time. Follow up as needed.   Stephens November, PharmD Clinical Pharmacy Resident 10:59 AM, 10/06/2015

## 2015-10-07 DIAGNOSIS — R1011 Right upper quadrant pain: Secondary | ICD-10-CM | POA: Diagnosis not present

## 2015-10-07 DIAGNOSIS — N281 Cyst of kidney, acquired: Secondary | ICD-10-CM | POA: Diagnosis not present

## 2015-10-07 DIAGNOSIS — K760 Fatty (change of) liver, not elsewhere classified: Secondary | ICD-10-CM | POA: Diagnosis not present

## 2015-10-13 DIAGNOSIS — Z471 Aftercare following joint replacement surgery: Secondary | ICD-10-CM | POA: Diagnosis not present

## 2015-10-13 DIAGNOSIS — Z96651 Presence of right artificial knee joint: Secondary | ICD-10-CM | POA: Diagnosis not present

## 2015-10-15 DIAGNOSIS — Z7984 Long term (current) use of oral hypoglycemic drugs: Secondary | ICD-10-CM | POA: Diagnosis not present

## 2015-10-15 DIAGNOSIS — Z794 Long term (current) use of insulin: Secondary | ICD-10-CM | POA: Diagnosis not present

## 2015-10-15 DIAGNOSIS — M545 Low back pain: Secondary | ICD-10-CM | POA: Diagnosis not present

## 2015-10-15 DIAGNOSIS — E119 Type 2 diabetes mellitus without complications: Secondary | ICD-10-CM | POA: Diagnosis not present

## 2015-10-15 DIAGNOSIS — N200 Calculus of kidney: Secondary | ICD-10-CM | POA: Diagnosis not present

## 2015-10-15 DIAGNOSIS — R1030 Lower abdominal pain, unspecified: Secondary | ICD-10-CM | POA: Diagnosis not present

## 2015-10-15 DIAGNOSIS — Z87442 Personal history of urinary calculi: Secondary | ICD-10-CM | POA: Diagnosis not present

## 2015-10-15 DIAGNOSIS — G8929 Other chronic pain: Secondary | ICD-10-CM | POA: Diagnosis not present

## 2015-10-15 DIAGNOSIS — I10 Essential (primary) hypertension: Secondary | ICD-10-CM | POA: Diagnosis not present

## 2015-10-18 DIAGNOSIS — M12811 Other specific arthropathies, not elsewhere classified, right shoulder: Secondary | ICD-10-CM | POA: Diagnosis not present

## 2015-11-08 ENCOUNTER — Ambulatory Visit (INDEPENDENT_AMBULATORY_CARE_PROVIDER_SITE_OTHER): Payer: Medicare Other

## 2015-11-08 DIAGNOSIS — R001 Bradycardia, unspecified: Secondary | ICD-10-CM

## 2015-11-18 DIAGNOSIS — E1165 Type 2 diabetes mellitus with hyperglycemia: Secondary | ICD-10-CM | POA: Diagnosis not present

## 2015-11-18 DIAGNOSIS — I2510 Atherosclerotic heart disease of native coronary artery without angina pectoris: Secondary | ICD-10-CM | POA: Diagnosis not present

## 2015-11-18 DIAGNOSIS — E669 Obesity, unspecified: Secondary | ICD-10-CM | POA: Diagnosis not present

## 2015-11-18 DIAGNOSIS — E1169 Type 2 diabetes mellitus with other specified complication: Secondary | ICD-10-CM | POA: Diagnosis not present

## 2015-11-18 DIAGNOSIS — E7800 Pure hypercholesterolemia, unspecified: Secondary | ICD-10-CM | POA: Diagnosis not present

## 2015-11-18 DIAGNOSIS — I119 Hypertensive heart disease without heart failure: Secondary | ICD-10-CM | POA: Diagnosis not present

## 2015-11-18 DIAGNOSIS — I129 Hypertensive chronic kidney disease with stage 1 through stage 4 chronic kidney disease, or unspecified chronic kidney disease: Secondary | ICD-10-CM | POA: Diagnosis not present

## 2015-11-18 DIAGNOSIS — E1121 Type 2 diabetes mellitus with diabetic nephropathy: Secondary | ICD-10-CM | POA: Diagnosis not present

## 2015-11-18 DIAGNOSIS — N183 Chronic kidney disease, stage 3 (moderate): Secondary | ICD-10-CM | POA: Diagnosis not present

## 2016-01-05 DIAGNOSIS — M5441 Lumbago with sciatica, right side: Secondary | ICD-10-CM | POA: Diagnosis not present

## 2016-01-18 DIAGNOSIS — M5441 Lumbago with sciatica, right side: Secondary | ICD-10-CM | POA: Diagnosis not present

## 2016-01-25 DIAGNOSIS — M5137 Other intervertebral disc degeneration, lumbosacral region: Secondary | ICD-10-CM | POA: Diagnosis not present

## 2016-01-25 DIAGNOSIS — M5126 Other intervertebral disc displacement, lumbar region: Secondary | ICD-10-CM | POA: Diagnosis not present

## 2016-01-25 DIAGNOSIS — M4806 Spinal stenosis, lumbar region: Secondary | ICD-10-CM | POA: Diagnosis not present

## 2016-01-25 DIAGNOSIS — M5441 Lumbago with sciatica, right side: Secondary | ICD-10-CM | POA: Diagnosis not present

## 2016-02-29 DIAGNOSIS — M5126 Other intervertebral disc displacement, lumbar region: Secondary | ICD-10-CM | POA: Diagnosis not present

## 2016-03-21 DIAGNOSIS — M5441 Lumbago with sciatica, right side: Secondary | ICD-10-CM | POA: Diagnosis not present

## 2016-03-28 DIAGNOSIS — Z23 Encounter for immunization: Secondary | ICD-10-CM | POA: Diagnosis not present

## 2016-03-28 DIAGNOSIS — E1165 Type 2 diabetes mellitus with hyperglycemia: Secondary | ICD-10-CM | POA: Diagnosis not present

## 2016-03-28 DIAGNOSIS — L723 Sebaceous cyst: Secondary | ICD-10-CM | POA: Diagnosis not present

## 2016-05-01 ENCOUNTER — Encounter: Payer: Self-pay | Admitting: Cardiology

## 2016-05-03 ENCOUNTER — Ambulatory Visit: Payer: Medicare Other | Admitting: Skilled Nursing Facility1

## 2016-05-08 ENCOUNTER — Ambulatory Visit: Payer: Medicare Other | Admitting: Cardiology

## 2016-05-17 ENCOUNTER — Encounter: Payer: Medicare Other | Attending: Family Medicine | Admitting: Skilled Nursing Facility1

## 2016-05-17 ENCOUNTER — Encounter: Payer: Self-pay | Admitting: Skilled Nursing Facility1

## 2016-05-17 DIAGNOSIS — E119 Type 2 diabetes mellitus without complications: Secondary | ICD-10-CM | POA: Diagnosis not present

## 2016-05-17 DIAGNOSIS — E669 Obesity, unspecified: Secondary | ICD-10-CM | POA: Diagnosis not present

## 2016-05-17 DIAGNOSIS — Z713 Dietary counseling and surveillance: Secondary | ICD-10-CM | POA: Diagnosis not present

## 2016-05-17 NOTE — Progress Notes (Signed)
Diabetes Self-Management Education  Visit Type: First/Initial  Appt. Start Time: 10:30 Appt. End Time: 11:47  05/17/2016  [name], identified by name and date of birth, is a 65 y.o. male with a diagnosis of Diabetes: Type 2.   ASSESSMENT  Height 5\' 10"  (1.778 m). There is no height or weight on file to calculate BMI. Pt states he has been using insulin for about 6 months. Pt states this last year has had a lot of changes including buying a new home in [place] to move this next week. Pt states his shoulder, necks, and knee hurts all the time. Pt states he checks his blood sugars before he eats in the morning. Pt states he snacks mostly at night.      Diabetes Self-Management Education - 05/17/16 1037      Visit Information   Visit Type First/Initial     Initial Visit   Diabetes Type Type 2   Are you currently following a meal plan? No   Are you taking your medications as prescribed? Yes   Date Diagnosed 5 years ago     Health Coping   How would you rate your overall health? Fair     Psychosocial Assessment   Patient Belief/Attitude about Diabetes Motivated to manage diabetes     Pre-Education Assessment   Patient understands the diabetes disease and treatment process. Needs Instruction   Patient understands incorporating nutritional management into lifestyle. Needs Instruction   Patient undertands incorporating physical activity into lifestyle. Needs Instruction   Patient understands using medications safely. Needs Instruction   Patient understands monitoring blood glucose, interpreting and using results Needs Instruction   Patient understands prevention, detection, and treatment of acute complications. Needs Instruction   Patient understands prevention, detection, and treatment of chronic complications. Needs Instruction   Patient understands how to develop strategies to address psychosocial issues. Needs Instruction   Patient understands how to develop  strategies to promote health/change behavior. Needs Instruction     Complications   Last HgB A1C per patient/outside source 10 %   How often do you check your blood sugar? 1-2 times/day   Have you had a dilated eye exam in the past 12 months? No   Have you had a dental exam in the past 12 months? Yes   Are you checking your feet? No     Dietary Intake   Breakfast none----fast food   Lunch none   Dinner sushi with salad and soup   Snack (evening) cookie----cake----pie---popcorn---nuts   Beverage(s) coffee with half and half     Exercise   Exercise Type ADL's     Patient Education   Previous Diabetes Education No   Disease state  Definition of diabetes, type 1 and 2, and the diagnosis of diabetes;Factors that contribute to the development of diabetes   Nutrition management  Role of diet in the treatment of diabetes and the relationship between the three main macronutrients and blood glucose level;Food label reading, portion sizes and measuring food.;Carbohydrate counting;Reviewed blood glucose goals for pre and post meals and how to evaluate the patients' food intake on their blood glucose level.;Meal timing in regards to the patients' current diabetes medication.;Information on hints to eating out and maintain blood glucose control.   Physical activity and exercise  Role of exercise on diabetes management, blood pressure control and cardiac health.   Monitoring Purpose and frequency of SMBG.;Yearly dilated eye exam;Daily foot exams;Identified appropriate SMBG and/or A1C goals.   Chronic complications Relationship between chronic  complications and blood glucose control;Assessed and discussed foot care and prevention of foot problems;Retinopathy and reason for yearly dilated eye exams;Reviewed with patient heart disease, higher risk of, and prevention;Nephropathy, what it is, prevention of, the use of ACE, ARB's and early detection of through urine microalbumia.;Dental care   Psychosocial  adjustment Worked with patient to identify barriers to care and solutions;Role of stress on diabetes     Individualized Goals (developed by patient)   Physical Activity Exercise 1-2 times per week;15 minutes per day   Monitoring  test my blood glucose as discussed;test blood glucose pre and post meals as discussed     Post-Education Assessment   Patient understands the diabetes disease and treatment process. Demonstrates understanding / competency   Patient understands incorporating nutritional management into lifestyle. Demonstrates understanding / competency   Patient undertands incorporating physical activity into lifestyle. Demonstrates understanding / competency   Patient understands using medications safely. Demonstrates understanding / competency   Patient understands monitoring blood glucose, interpreting and using results Demonstrates understanding / competency   Patient understands prevention, detection, and treatment of acute complications. Demonstrates understanding / competency   Patient understands prevention, detection, and treatment of chronic complications. Demonstrates understanding / competency   Patient understands how to develop strategies to address psychosocial issues. Demonstrates understanding / competency   Patient understands how to develop strategies to promote health/change behavior. Demonstrates understanding / competency     Outcomes   Expected Outcomes Demonstrated interest in learning. Expect positive outcomes   Future DMSE PRN   Program Status Completed      Individualized Plan for Diabetes Self-Management Training:   Learning Objective:  Patient will have a greater understanding of diabetes self-management. Patient education plan is to attend individual and/or group sessions per assessed needs and concerns.   Plan:   Patient Instructions  -Always bring your meter with you everywhere you go -Always Properly dispose of your needles:  -Discard in a  hard plastic/metal container with a lid (something the needle can't puncture)  -Write Do Not Recycle on the outside of the container  -Example: A laundry detergent bottle -Never use the same needle more than once -Eat about 3 carbohydrate choices for each meal and 1-2 for each snack -A meal: carbohydrates, protein, vegetable -A snack: A Fruit OR Vegetable AND Protein  -Try to be more active -Always pay attention to your body keeping watchful of possible low blood sugar (below 70) or high blood sugar (above 200)   -check your blood sugar before you eat anything (want it to be 80-130) -check your blood sugar 2 hours after you eat (80-180) -checking at different times throughout the day   -Check your feet every day   Expected Outcomes:  Demonstrated interest in learning. Expect positive outcomes  Education material provided: Living Well with Diabetes, Meal plan card, My Plate and Snack sheet  If problems or questions, patient to contact team via:  Phone  Future DSME appointment: PRN

## 2016-05-17 NOTE — Patient Instructions (Signed)
-  Always bring your meter with you everywhere you go -Always Properly dispose of your needles:  -Discard in a hard plastic/metal container with a lid (something the needle can't puncture)  -Write Do Not Recycle on the outside of the container  -Example: A laundry detergent bottle -Never use the same needle more than once -Eat about 3 carbohydrate choices for each meal and 1-2 for each snack -A meal: carbohydrates, protein, vegetable -A snack: A Fruit OR Vegetable AND Protein  -Try to be more active -Always pay attention to your body keeping watchful of possible low blood sugar (below 70) or high blood sugar (above 200)   -check your blood sugar before you eat anything (want it to be 80-130) -check your blood sugar 2 hours after you eat (80-180) -checking at different times throughout the day   -Check your feet every day

## 2016-06-28 ENCOUNTER — Ambulatory Visit (INDEPENDENT_AMBULATORY_CARE_PROVIDER_SITE_OTHER): Payer: Medicare Other | Admitting: Cardiology

## 2016-06-28 ENCOUNTER — Encounter: Payer: Self-pay | Admitting: Cardiology

## 2016-06-28 VITALS — BP 140/90 | HR 69 | Ht 70.0 in | Wt 225.1 lb

## 2016-06-28 DIAGNOSIS — E7800 Pure hypercholesterolemia, unspecified: Secondary | ICD-10-CM | POA: Diagnosis not present

## 2016-06-28 DIAGNOSIS — I420 Dilated cardiomyopathy: Secondary | ICD-10-CM

## 2016-06-28 DIAGNOSIS — I2510 Atherosclerotic heart disease of native coronary artery without angina pectoris: Secondary | ICD-10-CM

## 2016-06-28 DIAGNOSIS — I10 Essential (primary) hypertension: Secondary | ICD-10-CM | POA: Diagnosis not present

## 2016-06-28 MED ORDER — LOSARTAN POTASSIUM 100 MG PO TABS: 100.0000 mg | ORAL_TABLET | Freq: Every day | ORAL | 11 refills | Status: DC

## 2016-06-28 NOTE — Patient Instructions (Signed)
Medication Instructions:  1) INCREASE LOSARTAN to 100 mg daily  Labwork: You have been given a prescription for labs to be drawn. You must be FASTING for these labs.  Testing/Procedures: None  Follow-Up: Your physician wants you to follow-up in: 6 months with Dr. Radford Pax. You will receive a reminder letter in the mail two months in advance. If you don't receive a letter, please call our office to schedule the follow-up appointment.   Any Other Special Instructions Will Be Listed Below (If Applicable). Please check your blood pressure daily for a week and call with results.    If you need a refill on your cardiac medications before your next appointment, please call your pharmacy.

## 2016-06-28 NOTE — Progress Notes (Signed)
Cardiology Office Note    Date:  06/28/2016   ID:  Seth Washington, DOB [birth year], MRN [id number]  PCP:  Seth Casco, MD  Cardiologist:  Seth Him, MD   Chief Complaint  Patient presents with  . Coronary Artery Disease  . Hypertension  . Hyperlipidemia    History of Present Illness:  Seth Washington is a 66 y.o. male with a history of ASCAD, dyslipidemia,obesity and HTN who presents today for followup. He is doing well. He denies any chest pain, SOB, PND, orthopnea, LE edema, palpitations or syncope. He is currently not exercising due to moving to [place] and his wife having an illness.  Past Medical History:  Diagnosis Date  . Anxiety   . Aortic valve sclerosis    mild with no stenosis  . Arthritis    generalized   DDD  . BPH (benign prostatic hyperplasia)   . Bradycardia 09/23/2015   drug induced.  Resolved off BB  . Coronary artery disease    PCI LAD 10/03, NSTEMI with PCI PDA 2/09, PCI of left circ 5/09, STEMI in setting of post op from spine surgery with nonobstructive thrombus in the diagonal and 70% LAD secondary to spasm with no PCI due to spinal surgery  . Diabetes mellitus    type 2 on pills   . GERD (gastroesophageal reflux disease)   . Glaucoma   . History of kidney stones   . Hyperlipidemia   . Hypertension   . Mental disorder    PTSD  . Obesity   . OSA (obstructive sleep apnea)    last PSG 06/2015 showe no OSA  . Pain    LEFT SHOULDER PAIN - TORN ROTATOR CUFF - PT STATES SURGERY PLANNED IN OCT 2015  . Pain    RIGHT KNEE PAIN - S/P RIGHT UNI KNEE 06-10-12  . PTSD (post-traumatic stress disorder)   . PUD (peptic ulcer disease)     Past Surgical History:  Procedure Laterality Date  . arthroscopy  rt shoulder-2007  . CARDIAC CATHETERIZATION     4/11 ,PTCA w/stent '03,'09  . CERVICAL DISCECTOMY  4/11   FUSION  . CORONARY ANGIOPLASTY     stents in 2006 and 2009   . CYSTOSCOPY WITH RETROGRADE PYELOGRAM, URETEROSCOPY AND STENT PLACEMENT  Right 08/13/2013   Procedure: CYSTOSCOPY WITH RETROGRADE PYELOGRAM, URETEROSCOPY AND STENT PLACEMENT;  Surgeon: [name], MD;  Location: [facility];  Service: Urology;  Laterality: Right;  . HOLMIUM LASER APPLICATION Right [id number]   Procedure: HOLMIUM LASER APPLICATION;  Surgeon: [name], MD;  Location: [facility];  Service: Urology;  Laterality: Right;  . KNEE ARTHROSCOPY  1970's  . KNEE ARTHROSCOPY WITH LATERAL MENISECTOMY  06/10/2012   Procedure: KNEE ARTHROSCOPY WITH LATERAL MENISECTOMY;  Surgeon: [name], MD;  Location: [facility];  Service: Orthopedics;  Laterality: Right;  . PARTIAL KNEE ARTHROPLASTY  06/10/2012   Procedure: UNICOMPARTMENTAL KNEE;  Surgeon: [name], MD;  Location: [facility];  Service: Orthopedics;  Laterality: Right;  Right Medial Uni Knee  . RIGHT SHOULDER SUBACROMIAL DECOMPRESSION  05/23/11    . TOTAL KNEE REVISION WITH SCAR DEBRIDEMENT/PATELLA REVISION WITH POLY EXCHANGE Right 02/16/2014   Procedure: RIGHT KNEE INCISIONAL DEBRIDEMENT(SCAR) AND POLY REVISION;  Surgeon: [name], MD;  Location: [facility];  Service: Orthopedics;  Laterality: Right;    Current Medications: Outpatient Medications Prior to Visit  Medication Sig Dispense Refill  . aspirin 81 MG tablet Take 81 mg by mouth at bedtime.  Instructed to stay on per [name],and Dr [name]    . atorvastatin (LIPITOR) 80 MG tablet Take 1 tablet (80 mg total) by mouth at bedtime. 90 tablet 3  . clopidogrel (PLAVIX) 75 MG tablet Take 75 mg by mouth at bedtime.     Marland Kitchen ezetimibe (ZETIA) 10 MG tablet Take 10 mg by mouth at bedtime.     Marland Kitchen glipiZIDE (GLUCOTROL) 5 MG tablet Take by mouth daily before breakfast.    . insulin detemir (LEVEMIR) 100 UNIT/ML injection Inject into the skin at bedtime.    . nitroGLYCERIN (NITROSTAT) 0.4 MG SL tablet Place 0.4 mg under the tongue every 5 (five) minutes as needed for chest pain.    Marland Kitchen QUEtiapine (SEROQUEL) 25 MG tablet Take 25 mg by mouth at bedtime.       . saxagliptin HCl (ONGLYZA) 5 MG TABS tablet Take 5 mg by mouth at bedtime.     . sertraline (ZOLOFT) 50 MG tablet Take 100 mg by mouth at bedtime.     . traZODone (DESYREL) 150 MG tablet Take 150 mg by mouth at bedtime.     . cyclobenzaprine (FLEXERIL) 10 MG tablet Take 1 tablet (10 mg total) by mouth 3 (three) times daily as needed for muscle spasms. 30 tablet 0  . docusate sodium 100 MG CAPS Take 100 mg by mouth 2 (two) times daily. 10 capsule 0  . ferrous sulfate 325 (65 FE) MG tablet Take 1 tablet (325 mg total) by mouth 2 (two) times daily with a meal.  3  . fluticasone (FLONASE) 50 MCG/ACT nasal spray Place 1 spray into both nostrils daily as needed for allergies.     . hyoscyamine (LEVSIN, ANASPAZ) 0.125 MG tablet Take 1 tablet (0.125 mg total) by mouth every 4 (four) hours as needed (bladder spasms). 40 tablet 4  . losartan (COZAAR) 50 MG tablet Take 1 tablet (50 mg total) by mouth daily. 90 tablet 3  . omeprazole (PRILOSEC) 20 MG capsule Take 20 mg by mouth at bedtime.     Marland Kitchen oxyCODONE (OXY IR/ROXICODONE) 5 MG immediate release tablet Take 1-3 tablets (5-15 mg total) by mouth every 4 (four) hours as needed for severe pain. 120 tablet 0   Losartan 100mg  Take 1 tablet daily    . polyethylene glycol (MIRALAX / GLYCOLAX) packet Take 17 g by mouth 2 (two) times daily. 14 each 0   No facility-administered medications prior to visit.      Allergies:   Ace inhibitors; Metformin and related; and Niacin and related   Social History   Social History  . Marital status: Married    Spouse name: N/A  . Number of children: N/A  . Years of education: N/A   Social History Main Topics  . Smoking status: Former Smoker    Quit date: 09/13/2007  . Smokeless tobacco: Never Used  . Alcohol use No  . Drug use: No  . Sexual activity: Not Asked   Other Topics Concern  . None   Social History Narrative  . None     Family History:  The patient's family history includes Diabetes in his father;  Hypertension in his father.   ROS:   Please see the history of present illness.    ROS All other systems reviewed and are negative.  No flowsheet data found.     PHYSICAL EXAM:   VS:  BP 140/90   Pulse 69   Ht 5\' 10"  (1.778 m)   Wt 225 lb 1.9  oz (102.1 kg)   SpO2 96%   BMI 32.30 kg/m    GEN: Well nourished, well developed, in no acute distress  HEENT: normal  Neck: no JVD, carotid bruits, or masses Cardiac: RRR; no murmurs, rubs, or gallops,no edema.  Intact distal pulses bilaterally.  Respiratory:  clear to auscultation bilaterally, normal work of breathing GI: soft, nontender, nondistended, + BS MS: no deformity or atrophy  Skin: warm and dry, no rash Neuro:  Alert and Oriented x 3, Strength and sensation are intact Psych: euthymic mood, full affect  Wt Readings from Last 3 Encounters:  06/28/16 225 lb 1.9 oz (102.1 kg)  09/23/15 225 lb 12.8 oz (102.4 kg)  06/29/15 230 lb (104.3 kg)      Studies/Labs Reviewed:   EKG:  EKG is not ordered today.    Recent Labs: 10/06/2015: BUN 17; Creat 1.00; Potassium 4.6; Sodium 134   Lipid Panel    Component Value Date/Time   CHOL 110 10/12/2014 1035   TRIG 93.0 10/12/2014 1035   HDL 35.10 (L) 10/12/2014 1035   CHOLHDL 3 10/12/2014 1035   VLDL 18.6 10/12/2014 1035   LDLCALC 56 10/12/2014 1035   LDLDIRECT 147.0 08/19/2014 1017    Additional studies/ records that were reviewed today include:  none    ASSESSMENT:    1. Atherosclerosis of native coronary artery of native heart without angina pectoris   2. Essential hypertension, benign   3. DCM (dilated cardiomyopathy) ([place])   4. Pure hypercholesterolemia      PLAN:  In order of problems listed above:  1. ASCAD with PCI LAD 10/03, NSTEMI with PCI PDA 2/09, PCI of left circ 5/09, STEMI in setting of post op from spine surgery with nonobstructive thrombus in the diagonal and 70% LAD secondary to spasm with no PCI due to spinal surgery.  He has not had any anginal  symptoms recently.  Continue ASA/statin/Plavix.  No BB due to bradycardia.  2. HTN - BP borderline controlled - increase Losartan to 100mg  daily and repeat BMET in 1 week 3. DCM - EF 40-45% by echo 2015.  No BB due to bradycardia. He had a cough with ACE I.  Since BP elevated will titrated to Losartan 100mg  daily and check BMET in 1 week 4. Hyperlipidemia with LDL goal < 70.  Continue statin and zetia. Check FLP and ALT.    Medication Adjustments/Labs and Tests Ordered: Current medicines are reviewed at length with the patient today.  Concerns regarding medicines are outlined above.  Medication changes, Labs and Tests ordered today are listed in the Patient Instructions below.  There are no Patient Instructions on file for this visit.   Signed, Seth Him, MD  06/28/2016 3:01 PM    [facility] [place], [place], [place]  [address] Phone: [phone]; Fax: [phone]

## 2016-06-29 ENCOUNTER — Telehealth: Payer: Self-pay | Admitting: Cardiology

## 2016-06-29 NOTE — Telephone Encounter (Signed)
New Message     Returning Dr Landis Gandy call about Seth Washington , please call

## 2016-06-29 NOTE — Telephone Encounter (Signed)
Left message to call back  

## 2016-06-30 MED ORDER — LOSARTAN POTASSIUM 50 MG PO TABS: 50.0000 mg | ORAL_TABLET | Freq: Every day | ORAL | Status: AC

## 2016-06-30 NOTE — Telephone Encounter (Signed)
Patient was here the other day for office visit and was asked to call with rx of Losartan he had at home.   Pt reports having Losartan 50 mg tablets, take 1/2 tablet daily.  Pt states he has not even started/taken 50 mg of this medication.  Does he still need to start taking it at 100 mg daily? Will forward to [place] and her nurse for advisement. Pt aware it may be next week before he hears back from the office.   Patient verbalized understanding and agreeable to plan.

## 2016-06-30 NOTE — Telephone Encounter (Signed)
He needs to take Losartan 50mg  1 tablet daily

## 2016-06-30 NOTE — Telephone Encounter (Signed)
New Message  Pt voiced Lasartan prescription is for 50 mg only taking half which is 25 mg does pt still need 100 mg?  Please f/u

## 2016-06-30 NOTE — Telephone Encounter (Signed)
Called patient about Dr. Theodosia Blender recommendations. Patient stated he will take the Losartan 50 mg by mouth daily. Patient stated he already has medication and does not need a prescription. Changed patient's medication list to reflect correct dose. Patient verbalized understanding and had no other questions at this time.

## 2016-07-31 DIAGNOSIS — M542 Cervicalgia: Secondary | ICD-10-CM | POA: Diagnosis not present

## 2016-07-31 DIAGNOSIS — Z9889 Other specified postprocedural states: Secondary | ICD-10-CM | POA: Diagnosis not present

## 2016-09-18 DIAGNOSIS — M6281 Muscle weakness (generalized): Secondary | ICD-10-CM | POA: Diagnosis not present

## 2016-09-18 DIAGNOSIS — Z9889 Other specified postprocedural states: Secondary | ICD-10-CM | POA: Diagnosis not present

## 2016-09-18 DIAGNOSIS — M542 Cervicalgia: Secondary | ICD-10-CM | POA: Diagnosis not present

## 2016-09-20 DIAGNOSIS — Z9889 Other specified postprocedural states: Secondary | ICD-10-CM | POA: Diagnosis not present

## 2016-09-20 DIAGNOSIS — M542 Cervicalgia: Secondary | ICD-10-CM | POA: Diagnosis not present

## 2016-09-20 DIAGNOSIS — M6281 Muscle weakness (generalized): Secondary | ICD-10-CM | POA: Diagnosis not present

## 2016-10-04 DIAGNOSIS — Z9889 Other specified postprocedural states: Secondary | ICD-10-CM | POA: Diagnosis not present

## 2016-10-04 DIAGNOSIS — M542 Cervicalgia: Secondary | ICD-10-CM | POA: Diagnosis not present

## 2016-11-22 ENCOUNTER — Encounter: Payer: Self-pay | Admitting: Cardiology

## 2016-11-22 ENCOUNTER — Ambulatory Visit (INDEPENDENT_AMBULATORY_CARE_PROVIDER_SITE_OTHER): Payer: Medicare Other | Admitting: Cardiology

## 2016-11-22 VITALS — BP 132/78 | HR 80 | Ht 70.0 in | Wt 231.0 lb

## 2016-11-22 DIAGNOSIS — I2510 Atherosclerotic heart disease of native coronary artery without angina pectoris: Secondary | ICD-10-CM | POA: Diagnosis not present

## 2016-11-22 NOTE — Progress Notes (Signed)
11/22/2016 Seth Washington   05/29/1951  [id number]  Primary Physician Kelton Pillar, MD Primary Cardiologist: Dr. Radford Pax   Reason for Visit/CC: 6 month f/u for CAD  HPI:  Seth Washington is a 66 y.o. male, followed by Dr. Radford Pax, who presents to clinic today for 6 month f/u for CAD, for which he has a significant history. He underwent PCI to the LAD 03/2012, NSTEMI with PCI to the PDA 07/2007, PCI to the LCx 10/2007 as well a h/o STEMI that occurred post op after spine  Surgery with nonobstructive thrombus in the diagonal and 70% LAD secondary to spams-- no PCI due to spinal surgery. He also has a h/o Stewart with EF of 40-45% by echo in 2015. He is on medical therapy with ARB. No BB given h/o bradycardia. He also has a h/o HTN, HLD and aortic sclerosis w/o stenosis by echo in 2015. He was last seen by Dr. Radford Pax 06/28/16 and was stable from a cardiac standpoint. His PCP at the [facility] hospital follows his cholesterol. His last lipid panel 06/2016 showed controlled LDL at 47 mg/dL. He reports he recently had repeat labs last week and will have results faxed for our review.   He reports that he has done well since his last OV. He denies any CP or dyspnea. No exertional symptoms. He also denies palpitations, dizziness, syncope/ near syncope, orthopnea, PND and LEE. He reports full med compliance. EKG shows NSR w/o ischemic changes. BP is well controlled at 132/78.    Current Meds  Medication Sig  . allopurinol (ZYLOPRIM) 300 MG tablet Take 300 mg by mouth daily.  Marland Kitchen aspirin 81 MG tablet Take 81 mg by mouth at bedtime. Instructed to stay on per [name],and Dr [name]  . atorvastatin (LIPITOR) 80 MG tablet Take 1 tablet (80 mg total) by mouth at bedtime.  . clopidogrel (PLAVIX) 75 MG tablet Take 75 mg by mouth at bedtime.   Marland Kitchen ezetimibe (ZETIA) 10 MG tablet Take 10 mg by mouth at bedtime.   . insulin detemir (LEVEMIR) 100 UNIT/ML injection Inject into the skin at bedtime.  Marland Kitchen losartan (COZAAR) 50 MG  tablet Take 1 tablet (50 mg total) by mouth daily.  . nitroGLYCERIN (NITROSTAT) 0.4 MG SL tablet Place 0.4 mg under the tongue every 5 (five) minutes as needed for chest pain.  Marland Kitchen QUEtiapine (SEROQUEL) 25 MG tablet Take 25 mg by mouth at bedtime.   . saxagliptin HCl (ONGLYZA) 5 MG TABS tablet Take 5 mg by mouth at bedtime.   . sertraline (ZOLOFT) 50 MG tablet Take 100 mg by mouth at bedtime.   . traZODone (DESYREL) 150 MG tablet Take 150 mg by mouth at bedtime.   . [DISCONTINUED] glipiZIDE (GLUCOTROL) 5 MG tablet Take by mouth daily before breakfast.   Allergies  Allergen Reactions  . Ace Inhibitors Cough  . Metformin And Related Diarrhea    Severe diarrhea  . Niacin And Related Other (See Comments)    body flushed, makes body feel like its on fire   Past Medical History:  Diagnosis Date  . Anxiety   . Aortic valve sclerosis    mild with no stenosis  . Arthritis    generalized   DDD  . BPH (benign prostatic hyperplasia)   . Bradycardia 09/23/2015   drug induced.  Resolved off BB  . Coronary artery disease    PCI LAD 10/03, NSTEMI with PCI PDA 2/09, PCI of left circ 5/09, STEMI in setting of post op  from spine surgery with nonobstructive thrombus in the diagonal and 70% LAD secondary to spasm with no PCI due to spinal surgery  . Diabetes mellitus    type 2 on pills   . GERD (gastroesophageal reflux disease)   . Glaucoma   . History of kidney stones   . Hyperlipidemia   . Hypertension   . Mental disorder    PTSD  . Obesity   . OSA (obstructive sleep apnea)    last PSG 06/2015 showe no OSA  . Pain    LEFT SHOULDER PAIN - TORN ROTATOR CUFF - PT STATES SURGERY PLANNED IN OCT 2015  . Pain    RIGHT KNEE PAIN - S/P RIGHT UNI KNEE 06-10-12  . PTSD (post-traumatic stress disorder)   . PUD (peptic ulcer disease)    Family History  Problem Relation Age of Onset  . Hypertension Father   . Diabetes Father    Past Surgical History:  Procedure Laterality Date  . arthroscopy  rt  shoulder-2007  . CARDIAC CATHETERIZATION     4/11 ,PTCA w/stent '03,'09  . CERVICAL DISCECTOMY  4/11   FUSION  . CORONARY ANGIOPLASTY     stents in 2006 and 2009   . CYSTOSCOPY WITH RETROGRADE PYELOGRAM, URETEROSCOPY AND STENT PLACEMENT Right 08/13/2013   Procedure: CYSTOSCOPY WITH RETROGRADE PYELOGRAM, URETEROSCOPY AND STENT PLACEMENT;  Surgeon: [name], MD;  Location: [facility];  Service: Urology;  Laterality: Right;  . HOLMIUM LASER APPLICATION Right 0/25/4270   Procedure: HOLMIUM LASER APPLICATION;  Surgeon: [name], MD;  Location: [facility];  Service: Urology;  Laterality: Right;  . KNEE ARTHROSCOPY  1970's  . KNEE ARTHROSCOPY WITH LATERAL MENISECTOMY  06/10/2012   Procedure: KNEE ARTHROSCOPY WITH LATERAL MENISECTOMY;  Surgeon: [name], MD;  Location: [facility];  Service: Orthopedics;  Laterality: Right;  . PARTIAL KNEE ARTHROPLASTY  06/10/2012   Procedure: UNICOMPARTMENTAL KNEE;  Surgeon: [name], MD;  Location: [facility];  Service: Orthopedics;  Laterality: Right;  Right Medial Uni Knee  . RIGHT SHOULDER SUBACROMIAL DECOMPRESSION  05/23/11    . TOTAL KNEE REVISION WITH SCAR DEBRIDEMENT/PATELLA REVISION WITH POLY EXCHANGE Right 02/16/2014   Procedure: RIGHT KNEE INCISIONAL DEBRIDEMENT(SCAR) AND POLY REVISION;  Surgeon: [name], MD;  Location: [facility];  Service: Orthopedics;  Laterality: Right;   Social History   Social History  . Marital status: Married    Spouse name: N/A  . Number of children: N/A  . Years of education: N/A   Occupational History  . Not on file.   Social History Main Topics  . Smoking status: Former Smoker    Quit date: 09/13/2007  . Smokeless tobacco: Never Used  . Alcohol use No  . Drug use: No  . Sexual activity: Not on file   Other Topics Concern  . Not on file   Social History Narrative  . No narrative on file     Review of Systems: General: negative for chills, fever, night sweats or weight changes.    Cardiovascular: negative for chest pain, dyspnea on exertion, edema, orthopnea, palpitations, paroxysmal nocturnal dyspnea or shortness of breath Dermatological: negative for rash Respiratory: negative for cough or wheezing Urologic: negative for hematuria Abdominal: negative for nausea, vomiting, diarrhea, bright red blood per rectum, melena, or hematemesis Neurologic: negative for visual changes, syncope, or dizziness All other systems reviewed and are otherwise negative except as noted above.   Physical Exam:  Blood pressure 132/78, pulse 80, height 5\' 10"  (1.778  m), weight 231 lb (104.8 kg).  General appearance: alert, cooperative and no distress Neck: no carotid bruit and no JVD Lungs: clear to auscultation bilaterally Heart: regular rate and rhythm, S1, S2 normal, 1/6 murmur (aortic sclerosis) Extremities: extremities normal, atraumatic, no cyanosis or edema Pulses: 2+ and symmetric Skin: Skin color, texture, turgor normal. No rashes or lesions Neurologic: Grossly normal  EKG NSR. No ischemia.  -- personally reviewed   ASSESSMENT AND PLAN:   1. CAD: significant history. He underwent PCI to the LAD 03/2012, NSTEMI with PCI to the PDA 07/2007, PCI to the LCx 10/2007 as well a h/o STEMI that occurred post op after spine  Surgery with nonobstructive thrombus in the diagonal and 70% LAD secondary to spams-- no PCI due to spinal surgery. Stable w/o anginal symptoms. Continue medical therapy for secondary prevention, ASA, Plavix, Lipitor and Losartan. No BB given h/o bradycardia.  2. HTN: controlled on current regimen. Continue Losartan. Labs monitored by PCP. BMP 06/2016 showed stable renal function and K.   3. DLD: lipid profile has been followed by PCP. Labs 06/2015 showed LDL to be at goal at 47 mgd/L. Hepatic function was also normal.  He reports he recently had repeat labs last week at the [place] and will have results faxed for our review.    Follow-Up w/ Dr. Radford Pax in 6 months.    Hayato Guaman Ladoris Gene, MHS [facility] 11/22/2016 3:56 PM

## 2016-11-22 NOTE — Patient Instructions (Signed)
Medication Instructions:  None  Labwork: None  Testing/Procedures: None  Follow-Up: Your physician wants you to follow-up in: 6 months with Dr. Turner.  You will receive a reminder letter in the mail two months in advance. If you don't receive a letter, please call our office to schedule the follow-up appointment.   Any Other Special Instructions Will Be Listed Below (If Applicable).     If you need a refill on your cardiac medications before your next appointment, please call your pharmacy.   

## 2016-11-23 ENCOUNTER — Ambulatory Visit: Payer: Medicare Other | Admitting: Cardiology

## 2016-11-24 DIAGNOSIS — M542 Cervicalgia: Secondary | ICD-10-CM | POA: Diagnosis not present

## 2016-12-04 NOTE — Addendum Note (Signed)
Addended by: Claude Manges on: 12/04/2016 04:53 PM   Modules accepted: Orders

## 2016-12-07 DIAGNOSIS — Z9989 Dependence on other enabling machines and devices: Secondary | ICD-10-CM | POA: Diagnosis not present

## 2016-12-07 DIAGNOSIS — R221 Localized swelling, mass and lump, neck: Secondary | ICD-10-CM | POA: Diagnosis not present

## 2016-12-07 DIAGNOSIS — Z789 Other specified health status: Secondary | ICD-10-CM | POA: Diagnosis not present

## 2016-12-07 DIAGNOSIS — I2510 Atherosclerotic heart disease of native coronary artery without angina pectoris: Secondary | ICD-10-CM | POA: Diagnosis not present

## 2016-12-07 DIAGNOSIS — I10 Essential (primary) hypertension: Secondary | ICD-10-CM | POA: Diagnosis not present

## 2016-12-07 DIAGNOSIS — G4733 Obstructive sleep apnea (adult) (pediatric): Secondary | ICD-10-CM | POA: Diagnosis not present

## 2016-12-07 DIAGNOSIS — R51 Headache: Secondary | ICD-10-CM | POA: Diagnosis not present

## 2016-12-14 DIAGNOSIS — R918 Other nonspecific abnormal finding of lung field: Secondary | ICD-10-CM | POA: Diagnosis not present

## 2016-12-14 DIAGNOSIS — R221 Localized swelling, mass and lump, neck: Secondary | ICD-10-CM | POA: Diagnosis not present

## 2016-12-21 DIAGNOSIS — R221 Localized swelling, mass and lump, neck: Secondary | ICD-10-CM | POA: Diagnosis not present

## 2016-12-21 DIAGNOSIS — R51 Headache: Secondary | ICD-10-CM | POA: Diagnosis not present

## 2016-12-21 DIAGNOSIS — I2510 Atherosclerotic heart disease of native coronary artery without angina pectoris: Secondary | ICD-10-CM | POA: Diagnosis not present

## 2016-12-21 DIAGNOSIS — R911 Solitary pulmonary nodule: Secondary | ICD-10-CM | POA: Diagnosis not present

## 2016-12-21 DIAGNOSIS — Z789 Other specified health status: Secondary | ICD-10-CM | POA: Diagnosis not present

## 2016-12-21 DIAGNOSIS — Z9989 Dependence on other enabling machines and devices: Secondary | ICD-10-CM | POA: Diagnosis not present

## 2016-12-21 DIAGNOSIS — G4733 Obstructive sleep apnea (adult) (pediatric): Secondary | ICD-10-CM | POA: Diagnosis not present

## 2017-01-09 DIAGNOSIS — M542 Cervicalgia: Secondary | ICD-10-CM | POA: Diagnosis not present

## 2017-01-19 DIAGNOSIS — Z9989 Dependence on other enabling machines and devices: Secondary | ICD-10-CM | POA: Diagnosis not present

## 2017-01-19 DIAGNOSIS — G4733 Obstructive sleep apnea (adult) (pediatric): Secondary | ICD-10-CM | POA: Diagnosis not present

## 2017-01-19 DIAGNOSIS — R221 Localized swelling, mass and lump, neck: Secondary | ICD-10-CM | POA: Diagnosis not present

## 2017-01-19 DIAGNOSIS — Z789 Other specified health status: Secondary | ICD-10-CM | POA: Diagnosis not present

## 2017-01-19 DIAGNOSIS — M542 Cervicalgia: Secondary | ICD-10-CM | POA: Diagnosis not present

## 2017-01-22 ENCOUNTER — Telehealth: Payer: Self-pay

## 2017-01-22 NOTE — Telephone Encounter (Signed)
Patient is low to moderate risk from cardiac standpoint for surgical excision of suboccipital mass under general anesthesia

## 2017-01-22 NOTE — Telephone Encounter (Signed)
   [facility] Pre-operative Risk Assessment    Request for surgical clearance:  1) What type of surgery is being performed? Right suboccipital mass excision   2) When is this surgery scheduled? 03/07/2017   3) Are there any medications that need to be held prior to surgery and how long? None  4) Name of physician performing surgery? Junius Argyle, DO [facility])  5) Anesthesia type: General   6) What is your office phone and fax number:  (p)  [phone] ([phone]       Harland German A 01/22/2017, 12:11 PM  _________________________________________________________________   (provider comments below)

## 2017-02-12 DIAGNOSIS — I2510 Atherosclerotic heart disease of native coronary artery without angina pectoris: Secondary | ICD-10-CM | POA: Diagnosis not present

## 2017-02-12 DIAGNOSIS — R221 Localized swelling, mass and lump, neck: Secondary | ICD-10-CM | POA: Diagnosis not present

## 2017-02-12 DIAGNOSIS — Z789 Other specified health status: Secondary | ICD-10-CM | POA: Diagnosis not present

## 2017-02-12 DIAGNOSIS — M542 Cervicalgia: Secondary | ICD-10-CM | POA: Diagnosis not present

## 2017-02-26 DIAGNOSIS — Z01818 Encounter for other preprocedural examination: Secondary | ICD-10-CM | POA: Diagnosis not present

## 2017-02-26 DIAGNOSIS — R221 Localized swelling, mass and lump, neck: Secondary | ICD-10-CM | POA: Diagnosis not present

## 2017-02-26 DIAGNOSIS — M542 Cervicalgia: Secondary | ICD-10-CM | POA: Diagnosis not present

## 2017-02-26 DIAGNOSIS — R9431 Abnormal electrocardiogram [ECG] [EKG]: Secondary | ICD-10-CM | POA: Diagnosis not present

## 2017-02-27 ENCOUNTER — Telehealth: Payer: Self-pay | Admitting: Cardiology

## 2017-02-27 NOTE — Telephone Encounter (Signed)
New message       [facility] Pre-operative Risk Assessment    Request for surgical clearance:  1. What type of surgery is being performed? Occipital mass excision   2. When is this surgery scheduled? 03/12/17  3. Are there any medications that need to be held prior to surgery and how long? Plavix-per office he said he stopped plavix on 9/23. They need a letter stating this.  4. Name of physician performing surgery? Dr. Junius Argyle  5. What is your office phone and fax number? [phone]  Seth Washington 02/27/2017, 1:56 PM  _________________________________________________________________   (provider comments below)

## 2017-02-27 NOTE — Telephone Encounter (Signed)
Faxed to Dr. Mendel Ryder at [phone].

## 2017-02-27 NOTE — Telephone Encounter (Signed)
OK to hold Plavix fo 7 days prior to surgery

## 2017-03-01 DIAGNOSIS — Z7982 Long term (current) use of aspirin: Secondary | ICD-10-CM | POA: Diagnosis not present

## 2017-03-01 DIAGNOSIS — Z7902 Long term (current) use of antithrombotics/antiplatelets: Secondary | ICD-10-CM | POA: Diagnosis not present

## 2017-03-01 DIAGNOSIS — Z888 Allergy status to other drugs, medicaments and biological substances status: Secondary | ICD-10-CM | POA: Diagnosis not present

## 2017-03-01 DIAGNOSIS — R0602 Shortness of breath: Secondary | ICD-10-CM | POA: Diagnosis not present

## 2017-03-01 DIAGNOSIS — E119 Type 2 diabetes mellitus without complications: Secondary | ICD-10-CM | POA: Diagnosis not present

## 2017-03-01 DIAGNOSIS — R079 Chest pain, unspecified: Secondary | ICD-10-CM | POA: Diagnosis not present

## 2017-03-01 DIAGNOSIS — Z96659 Presence of unspecified artificial knee joint: Secondary | ICD-10-CM | POA: Diagnosis not present

## 2017-03-01 DIAGNOSIS — I252 Old myocardial infarction: Secondary | ICD-10-CM | POA: Diagnosis not present

## 2017-03-01 DIAGNOSIS — M542 Cervicalgia: Secondary | ICD-10-CM | POA: Diagnosis not present

## 2017-03-01 DIAGNOSIS — I10 Essential (primary) hypertension: Secondary | ICD-10-CM | POA: Diagnosis not present

## 2017-03-01 DIAGNOSIS — Z955 Presence of coronary angioplasty implant and graft: Secondary | ICD-10-CM | POA: Diagnosis not present

## 2017-03-05 ENCOUNTER — Ambulatory Visit: Payer: Medicare Other | Admitting: Cardiology

## 2017-03-05 DIAGNOSIS — I252 Old myocardial infarction: Secondary | ICD-10-CM | POA: Diagnosis not present

## 2017-03-05 DIAGNOSIS — I25110 Atherosclerotic heart disease of native coronary artery with unstable angina pectoris: Secondary | ICD-10-CM | POA: Diagnosis not present

## 2017-03-05 DIAGNOSIS — I2510 Atherosclerotic heart disease of native coronary artery without angina pectoris: Secondary | ICD-10-CM | POA: Diagnosis not present

## 2017-03-05 DIAGNOSIS — E119 Type 2 diabetes mellitus without complications: Secondary | ICD-10-CM | POA: Diagnosis not present

## 2017-03-05 DIAGNOSIS — Z87442 Personal history of urinary calculi: Secondary | ICD-10-CM | POA: Diagnosis not present

## 2017-03-05 DIAGNOSIS — I249 Acute ischemic heart disease, unspecified: Secondary | ICD-10-CM | POA: Diagnosis not present

## 2017-03-05 DIAGNOSIS — R079 Chest pain, unspecified: Secondary | ICD-10-CM | POA: Diagnosis not present

## 2017-03-05 DIAGNOSIS — R221 Localized swelling, mass and lump, neck: Secondary | ICD-10-CM | POA: Diagnosis present

## 2017-03-05 DIAGNOSIS — Z888 Allergy status to other drugs, medicaments and biological substances status: Secondary | ICD-10-CM | POA: Diagnosis not present

## 2017-03-05 DIAGNOSIS — I700 Atherosclerosis of aorta: Secondary | ICD-10-CM | POA: Diagnosis present

## 2017-03-05 DIAGNOSIS — I2111 ST elevation (STEMI) myocardial infarction involving right coronary artery: Secondary | ICD-10-CM | POA: Diagnosis not present

## 2017-03-05 DIAGNOSIS — F4310 Post-traumatic stress disorder, unspecified: Secondary | ICD-10-CM | POA: Diagnosis present

## 2017-03-05 DIAGNOSIS — Z794 Long term (current) use of insulin: Secondary | ICD-10-CM | POA: Diagnosis not present

## 2017-03-05 DIAGNOSIS — M542 Cervicalgia: Secondary | ICD-10-CM | POA: Diagnosis present

## 2017-03-05 DIAGNOSIS — D72829 Elevated white blood cell count, unspecified: Secondary | ICD-10-CM | POA: Diagnosis present

## 2017-03-05 DIAGNOSIS — I2119 ST elevation (STEMI) myocardial infarction involving other coronary artery of inferior wall: Secondary | ICD-10-CM | POA: Diagnosis not present

## 2017-03-05 DIAGNOSIS — Z955 Presence of coronary angioplasty implant and graft: Secondary | ICD-10-CM | POA: Diagnosis not present

## 2017-03-05 DIAGNOSIS — E785 Hyperlipidemia, unspecified: Secondary | ICD-10-CM | POA: Diagnosis not present

## 2017-03-05 DIAGNOSIS — Z96659 Presence of unspecified artificial knee joint: Secondary | ICD-10-CM | POA: Diagnosis present

## 2017-03-05 DIAGNOSIS — G8929 Other chronic pain: Secondary | ICD-10-CM | POA: Diagnosis present

## 2017-03-05 DIAGNOSIS — Z23 Encounter for immunization: Secondary | ICD-10-CM | POA: Diagnosis not present

## 2017-03-05 DIAGNOSIS — I10 Essential (primary) hypertension: Secondary | ICD-10-CM | POA: Diagnosis not present

## 2017-03-05 DIAGNOSIS — Z79899 Other long term (current) drug therapy: Secondary | ICD-10-CM | POA: Diagnosis not present

## 2017-03-12 ENCOUNTER — Ambulatory Visit: Payer: Medicare Other | Admitting: Physician Assistant

## 2017-04-04 DIAGNOSIS — Z955 Presence of coronary angioplasty implant and graft: Secondary | ICD-10-CM | POA: Diagnosis not present

## 2017-04-16 DIAGNOSIS — Z794 Long term (current) use of insulin: Secondary | ICD-10-CM | POA: Diagnosis not present

## 2017-04-16 DIAGNOSIS — E7800 Pure hypercholesterolemia, unspecified: Secondary | ICD-10-CM | POA: Diagnosis not present

## 2017-04-16 DIAGNOSIS — E119 Type 2 diabetes mellitus without complications: Secondary | ICD-10-CM | POA: Diagnosis not present

## 2017-04-16 DIAGNOSIS — M436 Torticollis: Secondary | ICD-10-CM | POA: Diagnosis not present

## 2017-04-16 DIAGNOSIS — I10 Essential (primary) hypertension: Secondary | ICD-10-CM | POA: Diagnosis not present

## 2017-04-16 DIAGNOSIS — Z7689 Persons encountering health services in other specified circumstances: Secondary | ICD-10-CM | POA: Diagnosis not present

## 2017-06-27 DIAGNOSIS — Z955 Presence of coronary angioplasty implant and graft: Secondary | ICD-10-CM | POA: Diagnosis not present

## 2017-06-28 DIAGNOSIS — R911 Solitary pulmonary nodule: Secondary | ICD-10-CM | POA: Diagnosis not present

## 2017-06-28 DIAGNOSIS — Z981 Arthrodesis status: Secondary | ICD-10-CM | POA: Diagnosis not present

## 2017-06-28 DIAGNOSIS — I2510 Atherosclerotic heart disease of native coronary artery without angina pectoris: Secondary | ICD-10-CM | POA: Diagnosis not present

## 2017-07-05 DIAGNOSIS — I2510 Atherosclerotic heart disease of native coronary artery without angina pectoris: Secondary | ICD-10-CM | POA: Diagnosis not present

## 2017-07-05 DIAGNOSIS — Z6833 Body mass index (BMI) 33.0-33.9, adult: Secondary | ICD-10-CM | POA: Diagnosis not present

## 2017-07-05 DIAGNOSIS — M542 Cervicalgia: Secondary | ICD-10-CM | POA: Diagnosis not present

## 2017-07-05 DIAGNOSIS — R221 Localized swelling, mass and lump, neck: Secondary | ICD-10-CM | POA: Diagnosis not present

## 2017-07-09 DIAGNOSIS — M4322 Fusion of spine, cervical region: Secondary | ICD-10-CM | POA: Diagnosis not present

## 2017-07-15 DIAGNOSIS — M4692 Unspecified inflammatory spondylopathy, cervical region: Secondary | ICD-10-CM | POA: Diagnosis not present

## 2017-07-15 DIAGNOSIS — M5021 Other cervical disc displacement,  high cervical region: Secondary | ICD-10-CM | POA: Diagnosis not present

## 2017-07-15 DIAGNOSIS — M4802 Spinal stenosis, cervical region: Secondary | ICD-10-CM | POA: Diagnosis not present

## 2017-07-15 DIAGNOSIS — Z981 Arthrodesis status: Secondary | ICD-10-CM | POA: Diagnosis not present

## 2017-07-15 DIAGNOSIS — M4322 Fusion of spine, cervical region: Secondary | ICD-10-CM | POA: Diagnosis not present

## 2017-07-20 DIAGNOSIS — M4322 Fusion of spine, cervical region: Secondary | ICD-10-CM | POA: Diagnosis not present

## 2017-07-26 DIAGNOSIS — M4322 Fusion of spine, cervical region: Secondary | ICD-10-CM | POA: Diagnosis not present

## 2017-08-07 DIAGNOSIS — L853 Xerosis cutis: Secondary | ICD-10-CM | POA: Diagnosis not present

## 2017-08-07 DIAGNOSIS — L821 Other seborrheic keratosis: Secondary | ICD-10-CM | POA: Diagnosis not present

## 2017-08-07 DIAGNOSIS — L570 Actinic keratosis: Secondary | ICD-10-CM | POA: Diagnosis not present

## 2017-08-07 DIAGNOSIS — D225 Melanocytic nevi of trunk: Secondary | ICD-10-CM | POA: Diagnosis not present

## 2017-08-07 DIAGNOSIS — L814 Other melanin hyperpigmentation: Secondary | ICD-10-CM | POA: Diagnosis not present

## 2017-08-16 DIAGNOSIS — M4322 Fusion of spine, cervical region: Secondary | ICD-10-CM | POA: Diagnosis not present

## 2017-08-21 DIAGNOSIS — M4322 Fusion of spine, cervical region: Secondary | ICD-10-CM | POA: Diagnosis not present

## 2017-08-23 DIAGNOSIS — M4322 Fusion of spine, cervical region: Secondary | ICD-10-CM | POA: Diagnosis not present

## 2017-08-28 DIAGNOSIS — M4322 Fusion of spine, cervical region: Secondary | ICD-10-CM | POA: Diagnosis not present

## 2017-09-17 DIAGNOSIS — M4322 Fusion of spine, cervical region: Secondary | ICD-10-CM | POA: Diagnosis not present

## 2017-09-17 DIAGNOSIS — M50321 Other cervical disc degeneration at C4-C5 level: Secondary | ICD-10-CM | POA: Diagnosis not present

## 2017-09-26 DIAGNOSIS — Z955 Presence of coronary angioplasty implant and graft: Secondary | ICD-10-CM | POA: Diagnosis not present

## 2017-11-27 DIAGNOSIS — E785 Hyperlipidemia, unspecified: Secondary | ICD-10-CM | POA: Diagnosis not present

## 2017-11-27 DIAGNOSIS — S2242XA Multiple fractures of ribs, left side, initial encounter for closed fracture: Secondary | ICD-10-CM | POA: Diagnosis not present

## 2017-11-27 DIAGNOSIS — Z87891 Personal history of nicotine dependence: Secondary | ICD-10-CM | POA: Diagnosis not present

## 2017-11-27 DIAGNOSIS — E119 Type 2 diabetes mellitus without complications: Secondary | ICD-10-CM | POA: Diagnosis not present

## 2017-11-27 DIAGNOSIS — I10 Essential (primary) hypertension: Secondary | ICD-10-CM | POA: Diagnosis not present

## 2017-11-27 DIAGNOSIS — W010XXA Fall on same level from slipping, tripping and stumbling without subsequent striking against object, initial encounter: Secondary | ICD-10-CM | POA: Diagnosis not present

## 2017-11-27 DIAGNOSIS — M25512 Pain in left shoulder: Secondary | ICD-10-CM | POA: Diagnosis not present

## 2017-12-03 DIAGNOSIS — W19XXXD Unspecified fall, subsequent encounter: Secondary | ICD-10-CM | POA: Diagnosis not present

## 2017-12-03 DIAGNOSIS — S2242XA Multiple fractures of ribs, left side, initial encounter for closed fracture: Secondary | ICD-10-CM | POA: Diagnosis not present

## 2017-12-17 DIAGNOSIS — S2242XD Multiple fractures of ribs, left side, subsequent encounter for fracture with routine healing: Secondary | ICD-10-CM | POA: Diagnosis not present
